# Patient Record
Sex: Female | Born: 1950
Health system: Southern US, Community
[De-identification: ages and names within clinical notes are randomized; demographics above are authoritative.]

## PROBLEM LIST (undated history)

## (undated) DIAGNOSIS — R0600 Dyspnea, unspecified: Secondary | ICD-10-CM

## (undated) DIAGNOSIS — J449 Chronic obstructive pulmonary disease, unspecified: Secondary | ICD-10-CM

## (undated) DIAGNOSIS — N189 Chronic kidney disease, unspecified: Secondary | ICD-10-CM

## (undated) DIAGNOSIS — I10 Essential (primary) hypertension: Secondary | ICD-10-CM

## (undated) DIAGNOSIS — IMO0002 Reserved for concepts with insufficient information to code with codable children: Secondary | ICD-10-CM

## (undated) DIAGNOSIS — M1990 Unspecified osteoarthritis, unspecified site: Secondary | ICD-10-CM

## (undated) HISTORY — PX: SHOULDER SURGERY: SHX246

## (undated) HISTORY — PX: TONSILLECTOMY: SUR1361

## (undated) HISTORY — PX: ABDOMINAL HYSTERECTOMY: SHX81

## (undated) HISTORY — PX: TUBAL LIGATION: SHX77

---

## 1999-02-12 ENCOUNTER — Encounter: Payer: Self-pay | Admitting: Emergency Medicine

## 1999-02-12 ENCOUNTER — Emergency Department (HOSPITAL_COMMUNITY): Admission: EM | Admit: 1999-02-12 | Discharge: 1999-02-12 | Payer: Self-pay | Admitting: Emergency Medicine

## 2004-12-31 ENCOUNTER — Emergency Department (HOSPITAL_COMMUNITY): Admission: EM | Admit: 2004-12-31 | Discharge: 2004-12-31 | Payer: Self-pay | Admitting: Emergency Medicine

## 2010-12-24 ENCOUNTER — Inpatient Hospital Stay (HOSPITAL_COMMUNITY)
Admission: EM | Admit: 2010-12-24 | Discharge: 2010-12-28 | Payer: Self-pay | Source: Home / Self Care | Attending: Internal Medicine | Admitting: Internal Medicine

## 2010-12-25 LAB — DIFFERENTIAL
Basophils Absolute: 0 10*3/uL (ref 0.0–0.1)
Basophils Relative: 0 % (ref 0–1)
Eosinophils Absolute: 0.3 10*3/uL (ref 0.0–0.7)
Eosinophils Relative: 4 % (ref 0–5)
Lymphocytes Relative: 25 % (ref 12–46)
Lymphs Abs: 2 10*3/uL (ref 0.7–4.0)
Monocytes Absolute: 0.4 10*3/uL (ref 0.1–1.0)
Monocytes Relative: 4 % (ref 3–12)
Neutro Abs: 5.5 10*3/uL (ref 1.7–7.7)
Neutrophils Relative %: 67 % (ref 43–77)

## 2010-12-25 LAB — BASIC METABOLIC PANEL
BUN: 11 mg/dL (ref 6–23)
CO2: 25 mEq/L (ref 19–32)
Calcium: 9.3 mg/dL (ref 8.4–10.5)
Chloride: 108 mEq/L (ref 96–112)
Creatinine, Ser: 1.49 mg/dL — ABNORMAL HIGH (ref 0.4–1.2)
GFR calc Af Amer: 43 mL/min — ABNORMAL LOW (ref >60)
GFR calc non Af Amer: 36 mL/min — ABNORMAL LOW (ref >60)
Glucose, Bld: 114 mg/dL — ABNORMAL HIGH (ref 70–99)
Potassium: 3.7 mEq/L (ref 3.5–5.1)
Sodium: 143 mEq/L (ref 135–145)

## 2010-12-25 LAB — CBC
HCT: 43.9 % (ref 36.0–46.0)
Hemoglobin: 14 g/dL (ref 12.0–15.0)
MCH: 30.4 pg (ref 26.0–34.0)
MCHC: 31.9 g/dL (ref 30.0–36.0)
MCV: 95.2 fL (ref 78.0–100.0)
Platelets: 128 10*3/uL — ABNORMAL LOW (ref 150–400)
RBC: 4.61 MIL/uL (ref 3.87–5.11)
RDW: 14.3 % (ref 11.5–15.5)
WBC: 8.2 10*3/uL (ref 4.0–10.5)

## 2010-12-29 NOTE — Discharge Summary (Addendum)
[name], Barbara [name]               ACCOUNT NO.:  [id number]  MEDICAL RECORD NO.:  [id number]          PATIENT TYPE:  INP  LOCATION:  1334                         FACILITY:  [facility]  PHYSICIAN:  Barbara Barbara [name], M.D.   DATE OF BIRTH:  1951/08/02  DATE OF ADMISSION:  12/24/2010 DATE OF DISCHARGE:  12/28/2010                              DISCHARGE SUMMARY   PRIMARY CARE PHYSICIAN:  None.  The patient states she will follow up with Dr. Clyda Barbara [name].  DISCHARGE DIAGNOSES: 1. Chronic obstructive pulmonary disease with acute exacerbation. 2. Ongoing tobacco abuse. 3. Acute renal failure in the setting of probable underlying stage 3     chronic kidney disease. 4. Thrombocytopenia. 5. Hypertension. 6. Hypokalemia. 7. Bronchitis. 8. Steroid-induced hyperglycemia.  DISCHARGE MEDICATIONS: 1. Advair 250/50 one puff inhaled b.i.d. once prednisone taper is     completed. 2. Albuterol 2.5 mg via nebulizer q.6 h p.r.n. dyspnea. 3. Clonidine 0.1 mg p.o. b.i.d. 4. Atrovent 0.02% inhaled q.6 h p.r.n. dyspnea. 5. Nicotine patch 21 mg transdermally daily x2 weeks, then taper to 14     mg transdermally daily x2 weeks, then taper to 7 mg transdermally     daily x2 weeks, then discontinue. 6. Prednisone Dosepak 60 mg p.o. on December 29, 2010, then decrease by     10 mg daily until off. 7. BC powders 1 packet p.o. q.6 h p.r.n. pain.  CONSULTATIONS:  None.  BRIEF ADMISSION HISTORY OF PRESENT ILLNESS:  The patient is a 60 year old female with a past medical history of COPD who presented to the hospital with a chief complaint of dyspnea unresponsive to use of Ventolin via inhaler.  Upon initial evaluation in the emergency department, the patient was noted to be acutely dyspneic with diffuse bronchospasm and subsequently was referred to the hospitalist service for further evaluation and treatment.  For the full details, please see the dictated report done by Dr. Selena [name].  PROCEDURES AND DIAGNOSTIC  STUDIES:  Chest x-ray on December 24, 2010, showed bronchitis and cardiomegaly.  DISCHARGE LABORATORY VALUES:  Sodium was 142, potassium 3.4 (repleted with 40 mEq of KCl prior to discharge), chloride 107, bicarb 27, BUN 15, creatinine 1.13, glucose 158, calcium 9.0.  White blood cell count was 12.2, hemoglobin 12.6, hematocrit 39.4, platelets 113.  A fractional excretion of sodium was calculated on December 26, 2010 and found to be 0.56%.  Urinalysis was negative for glucose, ketones, blood, protein, and nitrites.  Liver function studies were within normal limits with the exception of slightly low albumin at 3.4.  HOSPITAL COURSE BY PROBLEM: 1. Chronic obstructive pulmonary disease with acute exacerbation and     underlying bronchitis:  The patient was admitted and put on high-     dose IV Solu-Medrol.  She was empirically treated with IV Avelox.     She was provided with nebulized bronchodilator therapy and over the     course of her hospital stay had complete resolution of the     bronchospasm.  Her steroids have been on taper which she has been     tolerating well.  She has completed 5 days of  therapy with Avelox     and will not be discharged on any further antibiotic therapy as she     has been completely afebrile with no evidence of ongoing infection.     The patient was instructed to follow up with Dr. Bruna [name].  Because     she has cost constraints with regard to being able to afford her     medications, as many medications could be prescribed based on 4-     dollar [employer] list has been offered, however, Advair is not     available in generic and there are no generic alternatives.  The     patient was encouraged to make an appointment to see Dr. Bruna [name] to     see if he could get her into a prescription program or provide her     with samples. 2. Tobacco abuse:  The patient was counseled and is motivated to     discontinue tobacco use.  She was put on a nicotine patch and has      been completely educated regarding how to taper the patch.  The     patient has also been educated that if she should resolve resort to     smoking behavior, she should discontinue use of the patch. 3. Acute renal failure in the setting of probable underlying stage 3     chronic kidney disease:  The patient's baseline creatinine is not     known.  Her admission creatinine was 1.49.  A fractional excretion     of sodium was calculated and consistent with prerenal causes.  She     was hydrated and her discharge creatinine has improved to 1.13.     This corresponds to an estimated GFR of 59 mL per minute.  Since     she is currently fully hydrated, I suspect she does have underlying     stage 2 to 3 chronic kidney disease and will need close followup     and control of her blood pressure to prevent further deterioration     of her renal function. 4. Thrombocytopenia:  The patient was consistently thrombocytopenic     throughout her hospital stay with her platelet count in the 110s to     120s.  She had no signs of bleeding.  No further diagnostic     evaluation was done while in the hospital and she can follow up     with her primary care physician as an outpatient for this. 5. Hypertension:  The patient's blood pressure was elevated throughout     her hospital stay.  She was initially put on Norvasc which was     titrated up to achieve blood pressure control.  Given her cost     constraints, she will be discharged on clonidine and has been     encouraged to follow up with her primary care physician early next     week for a blood pressure check and adjustment of her medications     if indicated. 6. Hypokalemia:  The patient was repleted with 40 mEq of oral     replacement therapy prior to discharge. 7. Steroid-induced hyperglycemia:  The patient's hyperglycemia should     improve as the steroids are tapered.  She did not require treatment     and her glucose elevation was  mild.  DISPOSITION:  The patient is medically stable and will be discharged home.  We will attempt to obtain a nebulizer machine  for the patient prior to discharge.  CONDITION ON DISCHARGE:  Improved.  Time spent coordinating care for discharge and discharge instructions including face-to-face time equals 45 minutes.    Barbara Barbara [name], M.D.    CR/MEDQ  D:  12/28/2010  T:  12/28/2010  Job:  [id number]  cc:   Dr. Clyda Barbara [name]  Electronically Signed by Barbara Barbara [name] M.D. on 12/29/2010 06:57:47 AM

## 2010-12-30 LAB — DIFFERENTIAL
Basophils Absolute: 0 10*3/uL (ref 0.0–0.1)
Basophils Absolute: 0 10*3/uL (ref 0.0–0.1)
Basophils Relative: 0 % (ref 0–1)
Basophils Relative: 0 % (ref 0–1)
Eosinophils Absolute: 0 10*3/uL (ref 0.0–0.7)
Eosinophils Absolute: 0 10*3/uL (ref 0.0–0.7)
Eosinophils Relative: 0 % (ref 0–5)
Eosinophils Relative: 0 % (ref 0–5)
Lymphocytes Relative: 6 % — ABNORMAL LOW (ref 12–46)
Lymphocytes Relative: 3 % — ABNORMAL LOW (ref 12–46)
Lymphs Abs: 0.6 10*3/uL — ABNORMAL LOW (ref 0.7–4.0)
Lymphs Abs: 0.6 10*3/uL — ABNORMAL LOW (ref 0.7–4.0)
Monocytes Absolute: 0.1 10*3/uL (ref 0.1–1.0)
Monocytes Absolute: 0.4 10*3/uL (ref 0.1–1.0)
Monocytes Relative: 2 % — ABNORMAL LOW (ref 3–12)
Monocytes Relative: 2 % — ABNORMAL LOW (ref 3–12)
Neutro Abs: 8.7 10*3/uL — ABNORMAL HIGH (ref 1.7–7.7)
Neutro Abs: 15.5 10*3/uL — ABNORMAL HIGH (ref 1.7–7.7)
Neutrophils Relative %: 92 % — ABNORMAL HIGH (ref 43–77)
Neutrophils Relative %: 94 % — ABNORMAL HIGH (ref 43–77)

## 2010-12-30 LAB — COMPREHENSIVE METABOLIC PANEL
ALT: 19 U/L (ref 0–35)
ALT: 17 U/L (ref 0–35)
AST: 28 U/L (ref 0–37)
AST: 24 U/L (ref 0–37)
Albumin: 3.4 g/dL — ABNORMAL LOW (ref 3.5–5.2)
Albumin: 3.2 g/dL — ABNORMAL LOW (ref 3.5–5.2)
Alkaline Phosphatase: 70 U/L (ref 39–117)
Alkaline Phosphatase: 59 U/L (ref 39–117)
BUN: 17 mg/dL (ref 6–23)
BUN: 20 mg/dL (ref 6–23)
CO2: 24 mEq/L (ref 19–32)
CO2: 25 mEq/L (ref 19–32)
Calcium: 9.2 mg/dL (ref 8.4–10.5)
Calcium: 9 mg/dL (ref 8.4–10.5)
Chloride: 111 mEq/L (ref 96–112)
Chloride: 108 mEq/L (ref 96–112)
Creatinine, Ser: 1.41 mg/dL — ABNORMAL HIGH (ref 0.4–1.2)
Creatinine, Ser: 1.27 mg/dL — ABNORMAL HIGH (ref 0.4–1.2)
GFR calc Af Amer: 46 mL/min — ABNORMAL LOW (ref >60)
GFR calc Af Amer: 52 mL/min — ABNORMAL LOW (ref >60)
GFR calc non Af Amer: 38 mL/min — ABNORMAL LOW (ref >60)
GFR calc non Af Amer: 43 mL/min — ABNORMAL LOW (ref >60)
Glucose, Bld: 127 mg/dL — ABNORMAL HIGH (ref 70–99)
Glucose, Bld: 174 mg/dL — ABNORMAL HIGH (ref 70–99)
Potassium: 4 mEq/L (ref 3.5–5.1)
Potassium: 4.1 mEq/L (ref 3.5–5.1)
Sodium: 141 mEq/L (ref 135–145)
Sodium: 139 mEq/L (ref 135–145)
Total Bilirubin: 0.4 mg/dL (ref 0.3–1.2)
Total Bilirubin: 0.5 mg/dL (ref 0.3–1.2)
Total Protein: 6.8 g/dL (ref 6.0–8.3)
Total Protein: 6.9 g/dL (ref 6.0–8.3)

## 2010-12-30 LAB — CBC
HCT: 40.5 % (ref 36.0–46.0)
HCT: 39.1 % (ref 36.0–46.0)
HCT: 39.4 % (ref 36.0–46.0)
Hemoglobin: 12.8 g/dL (ref 12.0–15.0)
Hemoglobin: 12.3 g/dL (ref 12.0–15.0)
Hemoglobin: 12.6 g/dL (ref 12.0–15.0)
MCH: 30 pg (ref 26.0–34.0)
MCH: 30.1 pg (ref 26.0–34.0)
MCH: 29.6 pg (ref 26.0–34.0)
MCHC: 31.6 g/dL (ref 30.0–36.0)
MCHC: 31.5 g/dL (ref 30.0–36.0)
MCHC: 32 g/dL (ref 30.0–36.0)
MCV: 95.1 fL (ref 78.0–100.0)
MCV: 95.8 fL (ref 78.0–100.0)
MCV: 92.7 fL (ref 78.0–100.0)
Platelets: 114 10*3/uL — ABNORMAL LOW (ref 150–400)
Platelets: 117 10*3/uL — ABNORMAL LOW (ref 150–400)
Platelets: 113 10*3/uL — ABNORMAL LOW (ref 150–400)
RBC: 4.26 MIL/uL (ref 3.87–5.11)
RBC: 4.08 MIL/uL (ref 3.87–5.11)
RBC: 4.25 MIL/uL (ref 3.87–5.11)
RDW: 14.3 % (ref 11.5–15.5)
RDW: 14.6 % (ref 11.5–15.5)
RDW: 14.4 % (ref 11.5–15.5)
WBC: 9.4 10*3/uL (ref 4.0–10.5)
WBC: 16.4 10*3/uL — ABNORMAL HIGH (ref 4.0–10.5)
WBC: 12.2 10*3/uL — ABNORMAL HIGH (ref 4.0–10.5)

## 2010-12-30 LAB — BASIC METABOLIC PANEL
BUN: 20 mg/dL (ref 6–23)
CO2: 26 mEq/L (ref 19–32)
Calcium: 8.9 mg/dL (ref 8.4–10.5)
Chloride: 112 mEq/L (ref 96–112)
Creatinine, Ser: 1.19 mg/dL (ref 0.4–1.2)
GFR calc Af Amer: 56 mL/min — ABNORMAL LOW (ref >60)
GFR calc non Af Amer: 46 mL/min — ABNORMAL LOW (ref >60)
Glucose, Bld: 130 mg/dL — ABNORMAL HIGH (ref 70–99)
Potassium: 4.1 mEq/L (ref 3.5–5.1)
Sodium: 142 mEq/L (ref 135–145)

## 2010-12-30 LAB — URINALYSIS, ROUTINE W REFLEX MICROSCOPIC
Bilirubin Urine: NEGATIVE
Hgb urine dipstick: NEGATIVE
Ketones, ur: NEGATIVE mg/dL
Nitrite: NEGATIVE
Protein, ur: NEGATIVE mg/dL
Specific Gravity, Urine: 1.026 (ref 1.005–1.030)
Urine Glucose, Fasting: NEGATIVE mg/dL
Urobilinogen, UA: 0.2 mg/dL (ref 0.0–1.0)
pH: 5.5 (ref 5.0–8.0)

## 2010-12-30 LAB — URINE MICROSCOPIC-ADD ON

## 2010-12-30 LAB — CREATININE, URINE, RANDOM: Creatinine, Urine: 164.9 mg/dL

## 2010-12-30 LAB — SODIUM, URINE, RANDOM: Sodium, Ur: 93 mEq/L

## 2010-12-31 LAB — BASIC METABOLIC PANEL
BUN: 15 mg/dL (ref 6–23)
CO2: 27 mEq/L (ref 19–32)
Calcium: 9 mg/dL (ref 8.4–10.5)
Chloride: 107 mEq/L (ref 96–112)
Creatinine, Ser: 1.13 mg/dL (ref 0.4–1.2)
GFR calc Af Amer: 59 mL/min — ABNORMAL LOW (ref >60)
GFR calc non Af Amer: 49 mL/min — ABNORMAL LOW (ref >60)
Glucose, Bld: 158 mg/dL — ABNORMAL HIGH (ref 70–99)
Potassium: 3.4 mEq/L — ABNORMAL LOW (ref 3.5–5.1)
Sodium: 142 mEq/L (ref 135–145)

## 2011-01-18 NOTE — H&P (Signed)
Barbara [name], Barbara [name]               ACCOUNT NO.:  [id number]  MEDICAL RECORD NO.:  [id number]          PATIENT TYPE:  EMS  LOCATION:  ED                           FACILITY:  [facility]  PHYSICIAN:  Massie Maroon, MD        DATE OF BIRTH:  1951-04-09  DATE OF ADMISSION:  12/24/2010 DATE OF DISCHARGE:                             HISTORY & PHYSICAL   CHIEF COMPLAINT:  Shortness of breath.  HISTORY OF PRESENT ILLNESS:  60 year old female with a history of asthma, complains of shortness of breath for 3 days.  She has been tried to use a borrowed Ventolin inhaler.  She has had dry cough, runny nose, and denies any fever, chills, chest pain, palpitations, nausea, vomiting, diarrhea.  In spite of using Ventolin, her shortness of breath has been getting worse and so she presents to the ER today for evaluation.  Chest x-ray is negative for any acute process.  Patient will be admitted for possible asthma/COPD exacerbation.  PAST MEDICAL HISTORY: 1. Asthma. 2. Hypertension. 3. Arthritis of the right knee.  ALLERGIES:  No known drug allergies.  MEDICATIONS:  Ventolin HFA.  SOCIAL HISTORY:  The patient smokes one pack per day for 40 years.  She does not drink.  She works Psychologist, forensic.  FAMILY HISTORY:  Mother is alive at age 65 and has hypertension.  Father died in his 15s of brain cancer and had hypertension.  REVIEW OF SYSTEMS:  Negative for all 10 organ systems except for pertinent positives as stated above.  PHYSICAL EXAMINATION:  VITAL SIGNS:  Temperature 99.1, pulse 79, blood pressure 167/99, pulse ox is 97% on room air. HEENT: Anicteric. NECK:  No JVD, no bruit, no thyromegaly, no adenopathy. HEART:  Regular rate and rhythm.  S1 and S2.  No murmurs, gallops, or rubs. LUNGS:  Tight, positive bilateral expiratory wheezes.  No crackles. ABDOMEN:  Soft, nontender, nondistended.  Positive bowel sounds. EXTREMITIES:  No cyanosis, clubbing, or edema. SKIN:  No rashes. LYMPHATICS:  No  adenopathy. NEUROLOGIC:  Nonfocal.  Cranial nerves II through XII intact.  Reflexes are 2+, symmetric, diffuse with downgoing toes bilaterally, motor strength 5/5 in all 4 extremities, pinprick intact.  LABORATORY DATA:  Sodium 143, potassium 3.7, BUN 11, creatinine 1.49, calcium 9.3.  WBC 8.2, hemoglobin 14.0, platelet count 128.  Chest x-ray negative for any acute process other than bronchitis and cardiomegaly.  ASSESSMENT/PLAN: 1. Dyspnea likely secondary to asthma/chronic obstructive pulmonary     disease exacerbation.  The patient was treated with Solu-Medrol 80     mg IV q.8 along with Spiriva 1 puff daily along with Xopenex 1.25     mg 1 neb q.6 h. and q.6 h. p.r.n. and Avelox 400 mg IV daily.  The     patient is obviously counseled not to smoke. 2. Tobacco use:  The patient is counseled for 10 minutes on smoking     cessation.  Nicotine patch p.r.n. 3. Hypertension uncontrolled.  Clonidine 0.1 mg p.o. q.6 h. p.r.n. 4. Arthritis.  Tylenol. 5. Cardiomegaly.  Consider cardiac echo.     Massie Maroon, MD  JYK/MEDQ  D:  12/24/2010  T:  12/24/2010  Job:  [id number]  Electronically Signed by Pearson Grippe MD on 01/18/2011 10:58:24 PM

## 2011-10-11 ENCOUNTER — Emergency Department (HOSPITAL_COMMUNITY)
Admission: EM | Admit: 2011-10-11 | Discharge: 2011-10-11 | Disposition: A | Discharge status: Home / Self Care | Payer: Self-pay | Attending: Emergency Medicine | Admitting: Emergency Medicine

## 2011-10-11 ENCOUNTER — Emergency Department (HOSPITAL_COMMUNITY): Payer: Self-pay

## 2011-10-11 DIAGNOSIS — F17200 Nicotine dependence, unspecified, uncomplicated: Secondary | ICD-10-CM | POA: Insufficient documentation

## 2011-10-11 DIAGNOSIS — R0989 Other specified symptoms and signs involving the circulatory and respiratory systems: Secondary | ICD-10-CM | POA: Insufficient documentation

## 2011-10-11 DIAGNOSIS — J441 Chronic obstructive pulmonary disease with (acute) exacerbation: Secondary | ICD-10-CM | POA: Insufficient documentation

## 2011-10-11 DIAGNOSIS — R0602 Shortness of breath: Secondary | ICD-10-CM | POA: Insufficient documentation

## 2011-10-11 DIAGNOSIS — R0609 Other forms of dyspnea: Secondary | ICD-10-CM | POA: Insufficient documentation

## 2011-10-11 DIAGNOSIS — J45901 Unspecified asthma with (acute) exacerbation: Secondary | ICD-10-CM | POA: Insufficient documentation

## 2011-10-20 ENCOUNTER — Emergency Department (HOSPITAL_COMMUNITY): Payer: Self-pay

## 2011-10-20 ENCOUNTER — Inpatient Hospital Stay (HOSPITAL_COMMUNITY)
Admission: EM | Admit: 2011-10-20 | Discharge: 2011-10-23 | DRG: 192 | Disposition: A | Discharge status: Home / Self Care | Payer: Self-pay | Attending: Internal Medicine | Admitting: Internal Medicine

## 2011-10-20 ENCOUNTER — Encounter: Payer: Self-pay | Admitting: *Deleted

## 2011-10-20 DIAGNOSIS — R0602 Shortness of breath: Secondary | ICD-10-CM | POA: Diagnosis present

## 2011-10-20 DIAGNOSIS — I10 Essential (primary) hypertension: Secondary | ICD-10-CM

## 2011-10-20 DIAGNOSIS — J449 Chronic obstructive pulmonary disease, unspecified: Secondary | ICD-10-CM

## 2011-10-20 DIAGNOSIS — J441 Chronic obstructive pulmonary disease with (acute) exacerbation: Principal | ICD-10-CM

## 2011-10-20 DIAGNOSIS — F17200 Nicotine dependence, unspecified, uncomplicated: Secondary | ICD-10-CM | POA: Diagnosis present

## 2011-10-20 DIAGNOSIS — Z720 Tobacco use: Secondary | ICD-10-CM | POA: Diagnosis present

## 2011-10-20 HISTORY — DX: Essential (primary) hypertension: I10

## 2011-10-20 HISTORY — DX: Chronic obstructive pulmonary disease, unspecified: J44.9

## 2011-10-20 NOTE — ED Notes (Signed)
Pt in c/o shortness of breath since Saturday night, states she has increased her use of breathing treatments with minimal relief, pt also with cough, recently seen for same, pt O2 level 97% on RA in triage

## 2011-10-21 ENCOUNTER — Encounter (HOSPITAL_COMMUNITY): Payer: Self-pay | Admitting: Internal Medicine

## 2011-10-21 DIAGNOSIS — J449 Chronic obstructive pulmonary disease, unspecified: Secondary | ICD-10-CM | POA: Insufficient documentation

## 2011-10-21 DIAGNOSIS — Z720 Tobacco use: Secondary | ICD-10-CM | POA: Diagnosis present

## 2011-10-21 DIAGNOSIS — I10 Essential (primary) hypertension: Secondary | ICD-10-CM | POA: Insufficient documentation

## 2011-10-21 DIAGNOSIS — J441 Chronic obstructive pulmonary disease with (acute) exacerbation: Secondary | ICD-10-CM | POA: Diagnosis present

## 2011-10-21 LAB — CBC
HCT: 42.7 % (ref 36.0–46.0)
Hemoglobin: 12.2 g/dL (ref 12.0–15.0)
MCV: 94.1 fL (ref 78.0–100.0)
MCV: 92.6 fL (ref 78.0–100.0)
Platelets: 135 10*3/uL — ABNORMAL LOW (ref 150–400)
Platelets: 123 10*3/uL — ABNORMAL LOW (ref 150–400)
RBC: 4.54 MIL/uL (ref 3.87–5.11)
RBC: 4.17 MIL/uL (ref 3.87–5.11)
WBC: 10 10*3/uL (ref 4.0–10.5)
WBC: 10.9 10*3/uL — ABNORMAL HIGH (ref 4.0–10.5)

## 2011-10-21 LAB — DIFFERENTIAL
Basophils Absolute: 0 10*3/uL (ref 0.0–0.1)
Eosinophils Relative: 1 % (ref 0–5)
Lymphocytes Relative: 18 % (ref 12–46)
Lymphs Abs: 1.8 10*3/uL (ref 0.7–4.0)
Neutro Abs: 7.7 10*3/uL (ref 1.7–7.7)

## 2011-10-21 LAB — CARDIAC PANEL(CRET KIN+CKTOT+MB+TROPI)
CK, MB: 6.5 ng/mL (ref 0.3–4.0)
Relative Index: 0.8 (ref 0.0–2.5)
Relative Index: 0.5 (ref 0.0–2.5)
Total CK: 844 U/L — ABNORMAL HIGH (ref 7–177)

## 2011-10-21 LAB — POCT I-STAT, CHEM 8
BUN: 17 mg/dL (ref 6–23)
Chloride: 107 mEq/L (ref 96–112)
Creatinine, Ser: 1.3 mg/dL — ABNORMAL HIGH (ref 0.50–1.10)
Sodium: 142 mEq/L (ref 135–145)
TCO2: 26 mmol/L (ref 0–100)

## 2011-10-21 LAB — CREATININE, SERUM
Creatinine, Ser: 0.96 mg/dL (ref 0.50–1.10)
GFR calc Af Amer: 72 mL/min — ABNORMAL LOW (ref >90)

## 2011-10-21 MED ORDER — IPRATROPIUM BROMIDE 0.02 % IN SOLN
0.5000 mg | Freq: Once | RESPIRATORY_TRACT | Status: AC
  Administered 2011-10-21: 0.5 mg via RESPIRATORY_TRACT
  Filled 2011-10-21: qty 2.5

## 2011-10-21 MED ORDER — METHYLPREDNISOLONE SODIUM SUCC 125 MG IJ SOLR
60.0000 mg | Freq: Four times a day (QID) | INTRAMUSCULAR | Status: DC
  Administered 2011-10-21 – 2011-10-22 (×4): 60 mg via INTRAVENOUS
  Filled 2011-10-21 (×9): qty 2

## 2011-10-21 MED ORDER — PNEUMOCOCCAL VAC POLYVALENT 25 MCG/0.5ML IJ INJ
0.5000 mL | INJECTION | INTRAMUSCULAR | Status: AC
  Administered 2011-10-22: 0.5 mL via INTRAMUSCULAR
  Filled 2011-10-21: qty 0.5

## 2011-10-21 MED ORDER — IPRATROPIUM BROMIDE 0.02 % IN SOLN
0.5000 mg | Freq: Four times a day (QID) | RESPIRATORY_TRACT | Status: DC
  Administered 2011-10-21 – 2011-10-23 (×8): 0.5 mg via RESPIRATORY_TRACT
  Filled 2011-10-21 (×8): qty 2.5

## 2011-10-21 MED ORDER — GUAIFENESIN-DM 100-10 MG/5ML PO SYRP
5.0000 mL | ORAL_SOLUTION | ORAL | Status: DC | PRN
  Administered 2011-10-21: 5 mL via ORAL
  Filled 2011-10-21: qty 5
  Filled 2011-10-21: qty 10

## 2011-10-21 MED ORDER — METHYLPREDNISOLONE SODIUM SUCC 125 MG IJ SOLR
125.0000 mg | Freq: Once | INTRAMUSCULAR | Status: AC
  Administered 2011-10-21: 125 mg via INTRAVENOUS
  Filled 2011-10-21: qty 2

## 2011-10-21 MED ORDER — ALBUTEROL SULFATE (5 MG/ML) 0.5% IN NEBU
5.0000 mg | INHALATION_SOLUTION | RESPIRATORY_TRACT | Status: DC
  Administered 2011-10-21: 5 mg via RESPIRATORY_TRACT
  Filled 2011-10-21: qty 1

## 2011-10-21 MED ORDER — ALBUTEROL (5 MG/ML) CONTINUOUS INHALATION SOLN
15.0000 mg | INHALATION_SOLUTION | RESPIRATORY_TRACT | Status: DC
  Filled 2011-10-21: qty 20

## 2011-10-21 MED ORDER — SODIUM CHLORIDE 0.9 % IV BOLUS (SEPSIS)
1000.0000 mL | Freq: Once | INTRAVENOUS | Status: AC
  Administered 2011-10-21: 1000 mL via INTRAVENOUS

## 2011-10-21 MED ORDER — ONDANSETRON HCL 4 MG PO TABS: 4.0000 mg | ORAL_TABLET | Freq: Four times a day (QID) | ORAL | Status: DC | PRN

## 2011-10-21 MED ORDER — HYDROCODONE-HOMATROPINE 5-1.5 MG/5ML PO SYRP
5.0000 mL | ORAL_SOLUTION | Freq: Four times a day (QID) | ORAL | Status: DC | PRN
  Administered 2011-10-22 (×2): 5 mL via ORAL
  Filled 2011-10-21: qty 5

## 2011-10-21 MED ORDER — NICOTINE 21 MG/24HR TD PT24
21.0000 mg | MEDICATED_PATCH | Freq: Every day | TRANSDERMAL | Status: DC
  Administered 2011-10-21 – 2011-10-23 (×3): 21 mg via TRANSDERMAL
  Filled 2011-10-21 (×3): qty 1

## 2011-10-21 MED ORDER — ACETAMINOPHEN 325 MG PO TABS
650.0000 mg | ORAL_TABLET | Freq: Four times a day (QID) | ORAL | Status: DC | PRN
  Administered 2011-10-22 – 2011-10-23 (×2): 650 mg via ORAL
  Filled 2011-10-21 (×2): qty 2

## 2011-10-21 MED ORDER — SODIUM CHLORIDE 0.9 % IV SOLN: INTRAVENOUS | Status: DC

## 2011-10-21 MED ORDER — LEVALBUTEROL HCL 0.63 MG/3ML IN NEBU
0.6300 mg | INHALATION_SOLUTION | Freq: Four times a day (QID) | RESPIRATORY_TRACT | Status: DC
  Administered 2011-10-21 – 2011-10-23 (×8): 0.63 mg via RESPIRATORY_TRACT
  Filled 2011-10-21 (×12): qty 3

## 2011-10-21 MED ORDER — ALBUTEROL SULFATE (5 MG/ML) 0.5% IN NEBU
5.0000 mg | INHALATION_SOLUTION | Freq: Once | RESPIRATORY_TRACT | Status: AC
  Administered 2011-10-21: 5 mg via RESPIRATORY_TRACT
  Filled 2011-10-21: qty 1

## 2011-10-21 MED ORDER — BUDESONIDE-FORMOTEROL FUMARATE 80-4.5 MCG/ACT IN AERO
2.0000 | INHALATION_SPRAY | Freq: Two times a day (BID) | RESPIRATORY_TRACT | Status: DC
  Administered 2011-10-22 – 2011-10-23 (×3): 2 via RESPIRATORY_TRACT
  Filled 2011-10-21 (×2): qty 6.9

## 2011-10-21 MED ORDER — ONDANSETRON HCL 4 MG/2ML IJ SOLN: 4.0000 mg | Freq: Four times a day (QID) | INTRAMUSCULAR | Status: DC | PRN

## 2011-10-21 MED ORDER — SENNOSIDES-DOCUSATE SODIUM 8.6-50 MG PO TABS
1.0000 | ORAL_TABLET | Freq: Every day | ORAL | Status: DC | PRN
  Filled 2011-10-21: qty 1

## 2011-10-21 MED ORDER — ACETAMINOPHEN 650 MG RE SUPP: 650.0000 mg | Freq: Four times a day (QID) | RECTAL | Status: DC | PRN

## 2011-10-21 MED ORDER — INFLUENZA VIRUS VACC SPLIT PF IM SUSP
0.5000 mL | INTRAMUSCULAR | Status: AC
  Administered 2011-10-22: 0.5 mL via INTRAMUSCULAR
  Filled 2011-10-21: qty 0.5

## 2011-10-21 MED ORDER — SODIUM CHLORIDE 0.9 % IV SOLN
INTRAVENOUS | Status: DC
  Administered 2011-10-21 (×2): via INTRAVENOUS

## 2011-10-21 MED ORDER — HYDROCHLOROTHIAZIDE 25 MG PO TABS
25.0000 mg | ORAL_TABLET | Freq: Every day | ORAL | Status: DC
  Administered 2011-10-21 – 2011-10-23 (×3): 25 mg via ORAL
  Filled 2011-10-21 (×3): qty 1

## 2011-10-21 MED ORDER — FLUTICASONE-SALMETEROL 250-50 MCG/DOSE IN AEPB
1.0000 | INHALATION_SPRAY | Freq: Two times a day (BID) | RESPIRATORY_TRACT | Status: DC
  Administered 2011-10-21 – 2011-10-23 (×4): 1 via RESPIRATORY_TRACT
  Filled 2011-10-21: qty 14

## 2011-10-21 MED ORDER — HYDROMORPHONE HCL PF 1 MG/ML IJ SOLN: 1.0000 mg | INTRAMUSCULAR | Status: DC | PRN

## 2011-10-21 MED ORDER — MOXIFLOXACIN HCL IN NACL 400 MG/250ML IV SOLN
400.0000 mg | INTRAVENOUS | Status: DC
  Administered 2011-10-21 – 2011-10-22 (×2): 400 mg via INTRAVENOUS
  Filled 2011-10-21 (×3): qty 250

## 2011-10-21 MED ORDER — ENOXAPARIN SODIUM 40 MG/0.4ML ~~LOC~~ SOLN
40.0000 mg | SUBCUTANEOUS | Status: DC
  Administered 2011-10-21 – 2011-10-22 (×2): 40 mg via SUBCUTANEOUS
  Filled 2011-10-21 (×3): qty 0.4

## 2011-10-21 MED ORDER — ACETAMINOPHEN 325 MG PO TABS
650.0000 mg | ORAL_TABLET | Freq: Once | ORAL | Status: AC
  Administered 2011-10-21: 650 mg via ORAL
  Filled 2011-10-21: qty 2

## 2011-10-21 MED ORDER — AMLODIPINE BESYLATE 10 MG PO TABS
10.0000 mg | ORAL_TABLET | Freq: Every day | ORAL | Status: DC
  Administered 2011-10-21 – 2011-10-23 (×3): 10 mg via ORAL
  Filled 2011-10-21 (×3): qty 1

## 2011-10-21 NOTE — ED Notes (Signed)
RT called for neb tx.

## 2011-10-21 NOTE — ED Provider Notes (Cosign Needed)
History     CSN: [id number] Arrival date & time: 10/20/2011  8:24 PM   First MD Initiated Contact with Patient 10/20/11 2340      Chief Complaint  Patient presents with  . Shortness of Breath    (Consider location/radiation/quality/duration/timing/severity/associated sxs/prior treatment) HPI  She relates she has a history of COPD. She states 2 days ago she started getting dyspnea on exertion and on talking. She states she has a dry cough and is unaware of having fever. She states she has a pressure and tightness in her chest. She denies sore throat rhinorrhea vomiting or diarrhea. She relates she was here week ago and just finished prednisone a couple days ago. She states she was last admitted in January for a flareup of her COPD. She relates  her breathing has been so bad she is using her inhaler every 1-1/2 hours.  [facility]   Past Medical History  Diagnosis Date  . Hypertension   . COPD (chronic obstructive pulmonary disease)   . Asthma     History reviewed. No pertinent past surgical history.  History reviewed. No pertinent family history.  History  Substance Use Topics  . Smoking status: Current Everyday Smoker  . Smokeless tobacco: Not on file  . Alcohol Use: No   patient employed  OB History    Grav Para Term Preterm Abortions TAB SAB Ect Mult Living                  Review of Systems  All other systems reviewed and are negative.    Allergies  Clonidine derivatives; Codeine; Lisinopril; Penicillins; and Procaine  Home Medications   Current Outpatient Rx  Name Route Sig Dispense Refill  . ALBUTEROL SULFATE HFA 108 (90 BASE) MCG/ACT IN AERS Inhalation Inhale 2 puffs into the lungs every 6 (six) hours as needed. For shortness of breath      . ALBUTEROL SULFATE (2.5 MG/3ML) 0.083% IN NEBU Nebulization Take 2.5 mg by nebulization every 6 (six) hours as needed. For shortness of breath     . AMLODIPINE BESYLATE 10 MG PO TABS Oral  Take 10 mg by mouth daily.      Marland Kitchen FLUTICASONE-SALMETEROL 250-50 MCG/DOSE IN AEPB Inhalation Inhale 1 puff into the lungs every 12 (twelve) hours.      Marland Kitchen HYDROCHLOROTHIAZIDE 25 MG PO TABS Oral Take 25 mg by mouth daily.      Marland Kitchen TIOTROPIUM BROMIDE MONOHYDRATE 18 MCG IN CAPS Inhalation Place 18 mcg into inhaler and inhale daily. For shortness of breath      patient denies taking Advair and states she's only taking the Spiriva and albuterol inhalers  BP 134/76  Pulse 70  Temp(Src) 98.6 F (37 C) (Oral)  Resp 21  SpO2 100%  Vital signs normal  Physical Exam  Vitals reviewed. Constitutional: She appears well-developed and well-nourished.  HENT:  Head: Normocephalic and atraumatic.  Mouth/Throat: Uvula is midline, oropharynx is clear and moist and mucous membranes are normal.  Eyes: Conjunctivae and EOM are normal. Pupils are equal, round, and reactive to light.  Cardiovascular: Normal rate, regular rhythm, normal heart sounds and normal pulses.   Pulmonary/Chest:       Patient is to be lying on her side. She appears to be cachectic neck. She has some diffuse scattered wheezing with mild retractions.  Musculoskeletal:       Pt has no acute abnormality.    Neurological: She is alert. She has normal strength. No cranial nerve  deficit or sensory deficit.  Skin: Skin is warm, dry and intact. No rash noted.  Psychiatric: She has a normal mood and affect. Her speech is normal and behavior is normal.    ED Course  Procedures (including critical care time)  0300 recheck after continuous nebulizer albuterol 15 mg plus Atrovent 0.5 mg. Patient still appears tachypneic however her lungs do not have any wheezing or rhonchi. We will check her pulse ox when she ambulates. Her pulse ox currently is 92% on room air.  05:45 patient ambulated and her pulse ox was 93% but she had coughing fits and got more tachypneic. Requesting meds for headache and cough.   07:15 Dr Isidoro Donning accepts for admission to [facility],  Team 6   Results for orders placed during the hospital encounter of 10/20/11  CBC      Component Value Range   WBC 10.0  4.0 - 10.5 (K/uL)   RBC 4.54  3.87 - 5.11 (MIL/uL)   Hemoglobin 13.3  12.0 - 15.0 (g/dL)   HCT 16.1  09.6 - 04.5 (%)   MCV 94.1  78.0 - 100.0 (fL)   MCH 29.3  26.0 - 34.0 (pg)   MCHC 31.1  30.0 - 36.0 (g/dL)   RDW 40.9  81.1 - 91.4 (%)   Platelets 135 (*) 150 - 400 (K/uL)  DIFFERENTIAL      Component Value Range   Neutrophils Relative 77  43 - 77 (%)   Neutro Abs 7.7  1.7 - 7.7 (K/uL)   Lymphocytes Relative 18  12 - 46 (%)   Lymphs Abs 1.8  0.7 - 4.0 (K/uL)   Monocytes Relative 4  3 - 12 (%)   Monocytes Absolute 0.4  0.1 - 1.0 (K/uL)   Eosinophils Relative 1  0 - 5 (%)   Eosinophils Absolute 0.1  0.0 - 0.7 (K/uL)   Basophils Relative 0  0 - 1 (%)   Basophils Absolute 0.0  0.0 - 0.1 (K/uL)  POCT I-STAT, CHEM 8      Component Value Range   Sodium 142  135 - 145 (mEq/L)   Potassium 3.9  3.5 - 5.1 (mEq/L)   Chloride 107  96 - 112 (mEq/L)   BUN 17  6 - 23 (mg/dL)   Creatinine, Ser 7.82 (*) 0.50 - 1.10 (mg/dL)   Glucose, Bld 956 (*) 70 - 99 (mg/dL)   Calcium, Ion 2.13 (*) 1.12 - 1.32 (mmol/L)   TCO2 26  0 - 100 (mmol/L)   Hemoglobin 15.0  12.0 - 15.0 (g/dL)   HCT 08.6  57.8 - 46.9 (%)   Laboratory interpretation renal insufficiency otherwise normal   Dg Chest 2 View  10/20/2011  *RADIOLOGY REPORT*  Clinical Data: Cough and short of breath  CHEST - 2 VIEW  Comparison: 10/11/2011  Findings: Heart size upper normal.  Negative for heart failure. Negative for pneumonia or effusion.  Lungs are clear.  IMPRESSION: No active cardiopulmonary disease.  Original Report Authenticated By: Camelia Phenes, M.D.     Diagnoses that have been ruled out:  Diagnoses that are still under consideration:  Final diagnoses:  COPD exacerbation  Hypertension  COPD (chronic obstructive pulmonary disease)   Admission  CRITICAL CARE Performed by: Devoria Albe L   Total  critical care time: 32 minutes  Critical care time was exclusive of separately billable procedures and treating other patients.  Critical care was necessary to treat or prevent imminent or life-threatening deterioration.  Critical care was time spent personally by  me on the following activities: development of treatment plan with patient and/or surrogate as well as nursing, discussions with consultants, evaluation of patient's response to treatment, examination of patient, obtaining history from patient or surrogate, ordering and performing treatments and interventions, ordering and review of laboratory studies, ordering and review of radiographic studies, pulse oximetry and re-evaluation of patient's condition.  Devoria Albe, MD, FACEP   MDM          Ward Givens, MD 10/21/11 [phone]

## 2011-10-21 NOTE — ED Notes (Addendum)
B/P 130/59  HR 96  R 22  Pulse Ox 94%--Placed on 02 at 2 liters via nasal cannula

## 2011-10-21 NOTE — ED Notes (Signed)
Called RT for breathing treatment

## 2011-10-21 NOTE — H&P (Signed)
PCP:  [facility]  Chief Complaint:  Shortness of breath with wheezing and COPD flare for last 3-4 days  HPI: Ms. Cobbs is a 60 year old African American female with known history of COPD with asthma, nicotine abuse, hypertension presented to the [facility] emergency room with shortness of breath. History was provided by the patient herself. Patient states that she was in the emergency room 2 weeks ago with a COPD flare. She was placed on her nebulizers albuterol inhaler and prednisone. Patient finished her prednisone taper on Thursday 4 days ago. Patient states that she only got her nebulizers on Thursday that was 4 days ago due to insurance issues and she was only using her rescue inhaler all the time before that. Patient states that for last 3 days, her shortness of breath has been worsening and she has been using her nebulizers every 3 hours and rescue inhaler almost every hour. Patient is not on any home oxygen. She denied any fever or chills. She does have cough but denies any productive phlegm states that "she can't bring it up". She admits to wheezing but no chest pain, orthopnea, PND. She still smokes about 2 packs per week.   Review of Systems:  Constitutional: Denies fever, chills, diaphoresis, appetite change and fatigue.  HEENT: Denies photophobia, eye pain, redness, hearing loss, ear pain, congestion, sore throat, rhinorrhea, sneezing, mouth sores, trouble swallowing, neck pain, neck stiffness and tinnitus.   Respiratory: See history of present illness Cardiovascular: Denies chest pain, palpitations and leg swelling.  Gastrointestinal: Denies nausea, vomiting, abdominal pain, diarrhea, constipation, blood in stool and abdominal distention.  Genitourinary: Denies dysuria, urgency, frequency, hematuria, flank pain and difficulty urinating.  Musculoskeletal: Denies myalgias, back pain, joint swelling, arthralgias and gait problem.  Skin: Denies pallor, rash and wound.    Neurological: Denies dizziness, seizures, syncope, weakness, light-headedness, numbness and headaches.  Hematological: Denies adenopathy. Easy bruising, personal or family bleeding history  Psychiatric/Behavioral: Denies suicidal ideation, mood changes, confusion, nervousness, sleep disturbance and agitation  Past Medical History: Past Medical History  Diagnosis Date  . Hypertension   . COPD (chronic obstructive pulmonary disease)   . Asthma    History reviewed. No pertinent past surgical history.  Medications: Prior to Admission medications   Medication Sig Start Date End Date Taking? Authorizing Provider  albuterol (PROVENTIL HFA;VENTOLIN HFA) 108 (90 BASE) MCG/ACT inhaler Inhale 2 puffs into the lungs every 6 (six) hours as needed. For shortness of breath     Yes Historical Provider, MD  albuterol (PROVENTIL) (2.5 MG/3ML) 0.083% nebulizer solution Take 2.5 mg by nebulization every 6 (six) hours as needed. For shortness of breath    Yes Historical Provider, MD  amLODipine (NORVASC) 10 MG tablet Take 10 mg by mouth daily.     Yes Historical Provider, MD  Fluticasone-Salmeterol (ADVAIR) 250-50 MCG/DOSE AEPB Inhale 1 puff into the lungs every 12 (twelve) hours.     Yes Historical Provider, MD  hydrochlorothiazide (HYDRODIURIL) 25 MG tablet Take 25 mg by mouth daily.     Yes Historical Provider, MD  tiotropium (SPIRIVA) 18 MCG inhalation capsule Place 18 mcg into inhaler and inhale daily. For shortness of breath    Yes Historical Provider, MD    Allergies:   Allergies  Allergen Reactions  . Clonidine Derivatives     Coughing shortness of breath  . Codeine Other (See Comments)    Reaction: hallucination   . Lisinopril   . Penicillins Hives  . Procaine Rash    Social  History:  reports that she has been smoking.  She does not have any smokeless tobacco history on file. She reports that she does not drink alcohol or use illicit drugs.  Family History: History reviewed. No  pertinent family history.  Physical Exam: Blood pressure 134/76, pulse 118, temperature 98.6 F (37 C), temperature source Oral, resp. rate 22, SpO2 95.00%. General: Alert, awake, oriented x3, in no acute distress. HEENT: anicteric sclera, pink conjunctiva, pupils equal and reactive to light and accomodation Neck: supple, no masses or lymphadenopathy, no goiter, no bruits  Heart: Regular rate and rhythm, without murmurs, rubs or gallops. Lungs: Decreased breath sounds throughout, tightness Abdomen: Soft, nontender, nondistended, positive bowel sounds, no masses. Extremities: No clubbing, cyanosis or edema with positive pedal pulses. Neuro: Grossly intact, no focal neurological deficits, strength 5/5 upper and lower extremities bilaterally Psych: alert and oriented x 3, normal mood and affect Skin: no rashes or lesions, warm and dry   Labs on Admission:  Results for orders placed during the hospital encounter of 10/20/11 (from the past 24 hour(s))  CBC     Status: Abnormal   Collection Time   10/21/11 12:50 AM      Component Value Range   WBC 10.0  4.0 - 10.5 (K/uL)   RBC 4.54  3.87 - 5.11 (MIL/uL)   Hemoglobin 13.3  12.0 - 15.0 (g/dL)   HCT 16.1  09.6 - 04.5 (%)   MCV 94.1  78.0 - 100.0 (fL)   MCH 29.3  26.0 - 34.0 (pg)   MCHC 31.1  30.0 - 36.0 (g/dL)   RDW 40.9  81.1 - 91.4 (%)   Platelets 135 (*) 150 - 400 (K/uL)  DIFFERENTIAL     Status: Normal   Collection Time   10/21/11 12:50 AM      Component Value Range   Neutrophils Relative 77  43 - 77 (%)   Neutro Abs 7.7  1.7 - 7.7 (K/uL)   Lymphocytes Relative 18  12 - 46 (%)   Lymphs Abs 1.8  0.7 - 4.0 (K/uL)   Monocytes Relative 4  3 - 12 (%)   Monocytes Absolute 0.4  0.1 - 1.0 (K/uL)   Eosinophils Relative 1  0 - 5 (%)   Eosinophils Absolute 0.1  0.0 - 0.7 (K/uL)   Basophils Relative 0  0 - 1 (%)   Basophils Absolute 0.0  0.0 - 0.1 (K/uL)  POCT I-STAT, CHEM 8     Status: Abnormal   Collection Time   10/21/11  1:01 AM       Component Value Range   Sodium 142  135 - 145 (mEq/L)   Potassium 3.9  3.5 - 5.1 (mEq/L)   Chloride 107  96 - 112 (mEq/L)   BUN 17  6 - 23 (mg/dL)   Creatinine, Ser 7.82 (*) 0.50 - 1.10 (mg/dL)   Glucose, Bld 956 (*) 70 - 99 (mg/dL)   Calcium, Ion 2.13 (*) 1.12 - 1.32 (mmol/L)   TCO2 26  0 - 100 (mmol/L)   Hemoglobin 15.0  12.0 - 15.0 (g/dL)   HCT 08.6  57.8 - 46.9 (%)    Radiological Exams on Admission: Dg Chest 2 View  10/20/2011  *RADIOLOGY REPORT*  Clinical Data: Cough and short of breath  CHEST - 2 VIEW  Comparison: 10/11/2011  Findings: Heart size upper normal.  Negative for heart failure. Negative for pneumonia or effusion.  Lungs are clear.  IMPRESSION: No active cardiopulmonary disease.  Original Report Authenticated By:  Camelia Phenes, M.D.   Dg Chest 2 View  10/11/2011  *RADIOLOGY REPORT*  Clinical Data: Wheezing and shortness of breath.  History of COPD.  CHEST - 2 VIEW  Comparison: Chest radiographs 12/24/2010.  Findings: There is stable mild cardiomegaly.  The mediastinal contours are stable.  The lungs are clear.  There is no pleural effusion.  A moderate thoracolumbar scoliosis is noted.  Mid thoracic osteophyte on the lateral view appears unchanged.  IMPRESSION: Mild cardiomegaly.  No acute cardiopulmonary process.  Original Report Authenticated By: Gerrianne Scale, M.D.    Assessment/Plan Present on Admission:  .COPD with acute exacerbation - Admit to medicine service, placed on scheduled nebulizers with Xopenex and Atrovent, IV Solu-Medrol, IV Avelox and cough syrup, oxygen via nasal cannula, incentive spirometry. - Will also place patient on Symbicort  .Nicotine abuse - Patient was counseled strongly for smoking cessation, place on nicotine patch  Hypertension - Placed on Norvasc and hydrochlorothiazide per her home dose   DVT prophylaxis: Lovenox  CODE STATUS: I discussed in detail with the patient, she opted to be a limited code with no CPR or  intubation, but she is okay with antiarrhythmics vasopressors or BiPAP if needed.  @Time  Spent on Admission:  1 hour  Jovanie Verge 10/21/2011, 8:14 AM

## 2011-10-21 NOTE — ED Notes (Signed)
Report called to [facility], RN on 4E

## 2011-10-21 NOTE — ED Notes (Signed)
Pt. Remains alert and oriented.  Neb trt in progress with pt. Tolerating well.  Heart rate 98  Pulse ox 96%.

## 2011-10-22 LAB — CARDIAC PANEL(CRET KIN+CKTOT+MB+TROPI)
Relative Index: 0.5 (ref 0.0–2.5)
Total CK: 1480 U/L — ABNORMAL HIGH (ref 7–177)
Troponin I: <0.3 ng/mL (ref <0.30)

## 2011-10-22 LAB — URINE CULTURE

## 2011-10-22 LAB — CBC
MCHC: 30.8 g/dL (ref 30.0–36.0)
RDW: 14.9 % (ref 11.5–15.5)

## 2011-10-22 LAB — BASIC METABOLIC PANEL
BUN: 21 mg/dL (ref 6–23)
Creatinine, Ser: 1.05 mg/dL (ref 0.50–1.10)
GFR calc Af Amer: 65 mL/min — ABNORMAL LOW (ref >90)
GFR calc non Af Amer: 56 mL/min — ABNORMAL LOW (ref >90)
Potassium: 4 mEq/L (ref 3.5–5.1)

## 2011-10-22 MED ORDER — BENZONATATE 100 MG PO CAPS
100.0000 mg | ORAL_CAPSULE | Freq: Three times a day (TID) | ORAL | Status: DC
  Administered 2011-10-22 – 2011-10-23 (×3): 100 mg via ORAL
  Filled 2011-10-22 (×5): qty 1

## 2011-10-22 MED ORDER — VITAMINS A & D EX OINT
TOPICAL_OINTMENT | CUTANEOUS | Status: AC
  Filled 2011-10-22: qty 5

## 2011-10-22 MED ORDER — METHYLPREDNISOLONE SODIUM SUCC 40 MG IJ SOLR
40.0000 mg | Freq: Three times a day (TID) | INTRAMUSCULAR | Status: DC
  Administered 2011-10-22 – 2011-10-23 (×4): 40 mg via INTRAVENOUS
  Filled 2011-10-22 (×6): qty 1

## 2011-10-22 NOTE — Progress Notes (Signed)
Spoke with patient at bedside. States she feels like she is close to baseline, continues to feel slightly SOB but improved from admission. States she has issues with medications resolved and has what she needs currently. Confirmed that she has PCP thru [facility] [employer]. Lives at home with sister who is available if needed. Uses the bus for transportation. No d/c needs identified but will continue to follow.

## 2011-10-22 NOTE — Progress Notes (Signed)
Subjective: Feels a lot better today, still coughing, wheezing improved  Objective: Weight change:   Intake/Output Summary (Last 24 hours) at 10/22/11 1311 Last data filed at 10/22/11 0755  Gross per 24 hour  Intake   1680 ml  Output   1700 ml  Net    -20 ml   Blood pressure 140/75, pulse 80, temperature 98 F (36.7 C), temperature source Oral, resp. rate 20, height 5\' 5"  (1.651 m), weight 77.792 kg (171 lb 8 oz), SpO2 93.00%.  Physical Exam: General: Alert and awake, oriented x3, not in any acute distress. HEENT: anicteric sclera, pupils reactive to light and accommodation, EOMI CVS: S1-S2 clear, no murmur rubs or gallops Chest: Mild scattered wheezing on coughing bilaterally Abdomen: soft nontender, nondistended, normal bowel sounds, no organomegaly Extremities: no cyanosis, clubbing or edema noted bilaterally Neuro: Cranial nerves II-XII intact, no focal neurological deficits  Lab Results:  Basename 10/22/11 0005 10/21/11 1303  WBC 18.4* 10.9*  HGB 11.6* 12.2  HCT 37.7 38.6  PLT 120* 123*   BMET  Basename 10/22/11 0005 10/21/11 1303 10/21/11 0101  NA 138 -- 142  K 4.0 -- 3.9  CL 105 -- 107  CO2 25 -- --  GLUCOSE 161* -- 103*  BUN 21 -- 17  CREATININE 1.05 0.96 --  CALCIUM 9.4 -- --    Micro Results: No results found for this or any previous visit (from the past 240 hour(s)).  Studies/Results: Dg Chest 2 View  10/20/2011  *RADIOLOGY REPORT*  Clinical Data: Cough and short of breath  CHEST - 2 VIEW  Comparison: 10/11/2011  Findings: Heart size upper normal.  Negative for heart failure. Negative for pneumonia or effusion.  Lungs are clear.  IMPRESSION: No active cardiopulmonary disease.  Original Report Authenticated By: Camelia Phenes, M.D.   Dg Chest 2 View  10/11/2011  *RADIOLOGY REPORT*  Clinical Data: Wheezing and shortness of breath.  History of COPD.  CHEST - 2 VIEW  Comparison: Chest radiographs 12/24/2010.  Findings: There is stable mild cardiomegaly.   The mediastinal contours are stable.  The lungs are clear.  There is no pleural effusion.  A moderate thoracolumbar scoliosis is noted.  Mid thoracic osteophyte on the lateral view appears unchanged.  IMPRESSION: Mild cardiomegaly.  No acute cardiopulmonary process.  Original Report Authenticated By: Gerrianne Scale, M.D.    Medications: Scheduled Meds:   . amLODipine  10 mg Oral Daily  . benzonatate  100 mg Oral TID  . budesonide-formoterol  2 puff Inhalation BID  . enoxaparin  40 mg Subcutaneous Q24H  . Fluticasone-Salmeterol  1 puff Inhalation Q12H  . hydrochlorothiazide  25 mg Oral Daily  . influenza  inactive virus vaccine  0.5 mL Intramuscular Tomorrow-1000  . ipratropium  0.5 mg Nebulization Q6H  . levalbuterol  0.63 mg Nebulization Q6H  . methylPREDNISolone (SOLU-MEDROL) injection  40 mg Intravenous Q8H  . moxifloxacin  400 mg Intravenous Q24H  . nicotine  21 mg Transdermal Daily  . pneumococcal 23 valent vaccine  0.5 mL Intramuscular Tomorrow-1000  . vitamin A & D      . DISCONTD: sodium chloride   Intravenous STAT  . DISCONTD: albuterol  5 mg Nebulization Q4H  . DISCONTD: methylPREDNISolone (SOLU-MEDROL) injection  60 mg Intravenous Q6H   Continuous Infusions:   . sodium chloride 100 mL/hr at 10/22/11 0700  . DISCONTD: albuterol     PRN Meds:.acetaminophen, acetaminophen, guaiFENesin-dextromethorphan, HYDROcodone-homatropine, HYDROmorphone, ondansetron (ZOFRAN) IV, ondansetron, senna-docusate  Assessment/Plan: COPD with acute exacerbation  -  Continue current management with scheduled nebulizers with Xopenex and Atrovent - Taper IV Solu-Medrol, continue IV Avelox and cough syrup, oxygen via nasal cannula, incentive spirometry, continue Symbicort   .Nicotine abuse  - Patient was counseled strongly for smoking cessation, place on nicotine patch  - Smoking cessation consult  Hypertension  - On Norvasc and hydrochlorothiazide per her home dose   DVT prophylaxis:  Lovenox    disposition: Likely DC home in a.m. if stable    LOS: 2 days   RAI,RIPUDEEP 10/22/2011, 1:11 PM

## 2011-10-22 NOTE — Progress Notes (Signed)
Physical Therapy Note  Order received. Chart reviewed. Spoke with nursing and pt who both report pt has been mobilizing in room/hallway without assist or assistive device. No need for physical therapy services at this time. PT will sign off. Thanks.

## 2011-10-23 LAB — CBC
Platelets: 128 10*3/uL — ABNORMAL LOW (ref 150–400)
RBC: 4.2 MIL/uL (ref 3.87–5.11)
WBC: 18.7 10*3/uL — ABNORMAL HIGH (ref 4.0–10.5)

## 2011-10-23 MED ORDER — PREDNISONE (PAK) 10 MG PO TABS: 10.0000 mg | ORAL_TABLET | Freq: Every day | ORAL | Status: AC

## 2011-10-23 MED ORDER — HYDROCODONE-HOMATROPINE 5-1.5 MG/5ML PO SYRP: 5.0000 mL | ORAL_SOLUTION | Freq: Four times a day (QID) | ORAL | Status: AC | PRN

## 2011-10-23 MED ORDER — BUDESONIDE-FORMOTEROL FUMARATE 80-4.5 MCG/ACT IN AERO: 2.0000 | INHALATION_SPRAY | Freq: Two times a day (BID) | RESPIRATORY_TRACT | Status: DC

## 2011-10-23 MED ORDER — DOXYCYCLINE HYCLATE 100 MG PO TABS: 100.0000 mg | ORAL_TABLET | Freq: Two times a day (BID) | ORAL | Status: AC

## 2011-10-23 NOTE — Discharge Summary (Signed)
Physician Discharge Summary  Patient ID: Barbara [name] MRN: [id number] DOB/AGE: [birth year] 62 y.o.  Admit date: 10/20/2011 Discharge date: 10/23/2011  Primary Care Physician:  No primary provider on file.  Discharge Diagnoses:   Present on Admission:  .COPD with acute exacerbation .Nicotine abuse Hypertension   Consults:  None  Discharge Medications: Current Discharge Medication List    START taking these medications   Details  budesonide-formoterol (SYMBICORT) 80-4.5 MCG/ACT inhaler Inhale 2 puffs into the lungs 2 (two) times daily. Qty: 1 Inhaler, Refills: 3    doxycycline (VIBRA-TABS) 100 MG tablet Take 1 tablet (100 mg total) by mouth 2 (two) times daily. Qty: 20 tablet, Refills: 0    HYDROcodone-homatropine (HYCODAN) 5-1.5 MG/5ML syrup Take 5 mLs by mouth every 6 (six) hours as needed for cough. Qty: 120 mL, Refills: 0    predniSONE (STERAPRED UNI-PAK) 10 MG tablet Take 1 tablet (10 mg total) by mouth daily. Prednisone dosing: Take  Prednisone 40mg  (4 tabs) x 4days, then taper to 30mg  (3 tabs) x 3 days, then 20mg  (2 tabs) x 3days, then 10mg  (1 tab) x 3days, then OFF.  Dispense:  34 tabs, refills: None Qty: 34 tablet, Refills: 0      CONTINUE these medications which have NOT CHANGED   Details  albuterol (PROVENTIL HFA;VENTOLIN HFA) 108 (90 BASE) MCG/ACT inhaler Inhale 2 puffs into the lungs every 6 (six) hours as needed. For shortness of breath      albuterol (PROVENTIL) (2.5 MG/3ML) 0.083% nebulizer solution Take 2.5 mg by nebulization every 6 (six) hours as needed. For shortness of breath     amLODipine (NORVASC) 10 MG tablet Take 10 mg by mouth daily.      hydrochlorothiazide (HYDRODIURIL) 25 MG tablet Take 25 mg by mouth daily.      tiotropium (SPIRIVA) 18 MCG inhalation capsule Place 18 mcg into inhaler and inhale daily. For shortness of breath       STOP taking these medications     Fluticasone-Salmeterol (ADVAIR) 250-50 MCG/DOSE AEPB           Significant Diagnostic Studies:  Dg Chest 2 View  10/20/2011  *RADIOLOGY REPORT*  Clinical Data: Cough and short of breath  CHEST - 2 VIEW  Comparison: 10/11/2011  Findings: Heart size upper normal.  Negative for heart failure. Negative for pneumonia or effusion.  Lungs are clear.  IMPRESSION: No active cardiopulmonary disease.  Original Report Authenticated By: Camelia Phenes, M.D.    Brief H and P: For complete details please refer to admission H and P, but in brief, patient is 60 year old [place] American female with known history of COPD with asthma, nicotine abuse, hypertension presented to the [facility] emergency room with shortness of breath. History was provided by the patient herself. Patient states that she was in the emergency room 2 weeks ago with a COPD flare. She was placed on her nebulizers albuterol inhaler and prednisone. Patient finished her prednisone taper on Thursday 4 days ago. Patient states that she only got her nebulizers on Thursday that was 4 days ago due to insurance issues and she was only using her rescue inhaler all the time before that. Patient states that for last 3 days, her shortness of breath has been worsening and she has been using her nebulizers every 3 hours and rescue inhaler almost every hour. Patient is not on any home oxygen. She denied any fever or chills. She does have cough but denies any productive phlegm states that "she can't bring it up".  She admits to wheezing but no chest pain, orthopnea, PND. She still smokes about 2 packs per week.   Hospital Course:  COPD with acute exacerbation: Patient was admitted to the medicine service as this was a recurrent exacerbation. She was placed on scheduled nebulizers with Xopenex and Atrovent. She was placed on IV Solu-Medrol which was tapered. Patient was also placed on IV antibiotics and cough syrup, oxygen, incentive spirometry, Symbicort. Patient will continue prednisone with taper, albuterol nebs,  Symbicort after discharge. .Nicotine abuse  - Patient was counseled strongly for smoking cessation and was placed on nicotine patch  Hypertension remained stable patient was placed on Norvasc and hydrochlorothiazide for her home dose   Day of Discharge BP 153/83  Pulse 68  Temp(Src) 98.5 F (36.9 C) (Oral)  Resp 18  Ht 5\' 5"  (1.651 m)  Wt 77.021 kg (169 lb 12.8 oz)  BMI 28.26 kg/m2  SpO2 96%  Results for orders placed during the hospital encounter of 10/20/11 (from the past 24 hour(s))  CBC     Status: Abnormal   Collection Time   10/23/11  4:35 AM      Component Value Range   WBC 18.7 (*) 4.0 - 10.5 (K/uL)   RBC 4.20  3.87 - 5.11 (MIL/uL)   Hemoglobin 12.3  12.0 - 15.0 (g/dL)   HCT 16.1  09.6 - 04.5 (%)   MCV 92.6  78.0 - 100.0 (fL)   MCH 29.3  26.0 - 34.0 (pg)   MCHC 31.6  30.0 - 36.0 (g/dL)   RDW 40.9  81.1 - 91.4 (%)   Platelets 128 (*) 150 - 400 (K/uL)    Physical Exam: General: Alert and awake oriented x3 not in any acute distress. HEENT: anicteric sclera, pupils reactive to light and accommodation CVS: S1-S2 clear no murmur rubs or gallops Chest: clear to auscultation bilaterally, no wheezing rales or rhonchi Abdomen: soft nontender, nondistended, normal bowel sounds, no organomegaly Extremities: no cyanosis, clubbing or edema noted bilaterally Neuro: Cranial nerves II-XII intact, no focal neurological deficits  Disposition: Home Diet: heart healthy  Activity: as tolerated   Disposition and Follow-up: Discharge Orders    Future Orders Please Complete By Expires   Diet - low sodium heart healthy      Increase activity slowly         DISCHARGE FOLLOW-UP Follow-up Information    Follow up with [facility]. Make an appointment in 10 days. (make appointment for early next week if symptoms are worsening )          Time spent on Discharge: 45 minutes   Signed: RAI,RIPUDEEP 10/23/2011, 2:14 PM

## 2011-11-26 ENCOUNTER — Other Ambulatory Visit: Payer: Self-pay

## 2011-11-26 ENCOUNTER — Emergency Department (HOSPITAL_COMMUNITY): Payer: Self-pay

## 2011-11-26 ENCOUNTER — Encounter (HOSPITAL_COMMUNITY): Payer: Self-pay | Admitting: Emergency Medicine

## 2011-11-26 ENCOUNTER — Inpatient Hospital Stay (HOSPITAL_COMMUNITY)
Admission: EM | Admit: 2011-11-26 | Discharge: 2011-12-01 | DRG: 191 | Disposition: A | Discharge status: Home / Self Care | Payer: Self-pay | Attending: Internal Medicine | Admitting: Internal Medicine

## 2011-11-26 DIAGNOSIS — T380X5A Adverse effect of glucocorticoids and synthetic analogues, initial encounter: Secondary | ICD-10-CM | POA: Diagnosis not present

## 2011-11-26 DIAGNOSIS — J479 Bronchiectasis, uncomplicated: Secondary | ICD-10-CM | POA: Diagnosis present

## 2011-11-26 DIAGNOSIS — F17200 Nicotine dependence, unspecified, uncomplicated: Secondary | ICD-10-CM | POA: Diagnosis present

## 2011-11-26 DIAGNOSIS — J111 Influenza due to unidentified influenza virus with other respiratory manifestations: Secondary | ICD-10-CM | POA: Diagnosis present

## 2011-11-26 DIAGNOSIS — I10 Essential (primary) hypertension: Secondary | ICD-10-CM | POA: Diagnosis present

## 2011-11-26 DIAGNOSIS — E860 Dehydration: Secondary | ICD-10-CM | POA: Diagnosis present

## 2011-11-26 DIAGNOSIS — J441 Chronic obstructive pulmonary disease with (acute) exacerbation: Principal | ICD-10-CM | POA: Diagnosis present

## 2011-11-26 DIAGNOSIS — N179 Acute kidney failure, unspecified: Secondary | ICD-10-CM | POA: Diagnosis present

## 2011-11-26 DIAGNOSIS — R7309 Other abnormal glucose: Secondary | ICD-10-CM | POA: Diagnosis not present

## 2011-11-26 LAB — CBC
HCT: 40.2 % (ref 36.0–46.0)
Hemoglobin: 13.1 g/dL (ref 12.0–15.0)
MCH: 29.4 pg (ref 26.0–34.0)
MCHC: 32.6 g/dL (ref 30.0–36.0)
MCV: 90.3 fL (ref 78.0–100.0)
Platelets: 188 10*3/uL (ref 150–400)
RBC: 4.45 MIL/uL (ref 3.87–5.11)
RDW: 14.4 % (ref 11.5–15.5)
WBC: 13.7 10*3/uL — ABNORMAL HIGH (ref 4.0–10.5)

## 2011-11-26 LAB — DIFFERENTIAL
Basophils Absolute: 0 10*3/uL (ref 0.0–0.1)
Basophils Relative: 0 % (ref 0–1)
Eosinophils Absolute: 0.1 10*3/uL (ref 0.0–0.7)
Eosinophils Relative: 1 % (ref 0–5)
Lymphocytes Relative: 4 % — ABNORMAL LOW (ref 12–46)
Lymphs Abs: 0.5 10*3/uL — ABNORMAL LOW (ref 0.7–4.0)
Monocytes Absolute: 0.6 10*3/uL (ref 0.1–1.0)
Monocytes Relative: 4 % (ref 3–12)
Neutro Abs: 12.5 10*3/uL — ABNORMAL HIGH (ref 1.7–7.7)
Neutrophils Relative %: 91 % — ABNORMAL HIGH (ref 43–77)

## 2011-11-26 LAB — BLOOD GAS, ARTERIAL
Acid-base deficit: 0.3 mmol/L (ref 0.0–2.0)
Bicarbonate: 23.6 mEq/L (ref 20.0–24.0)
Drawn by: 310571
O2 Content: 2 L/min
O2 Saturation: 96.6 %
Patient temperature: 98.6
TCO2: 21 mmol/L (ref 0–100)
pCO2 arterial: 38.2 mmHg (ref 35.0–45.0)
pH, Arterial: 7.407 — ABNORMAL HIGH (ref 7.350–7.400)
pO2, Arterial: 87.4 mmHg (ref 80.0–100.0)

## 2011-11-26 LAB — BASIC METABOLIC PANEL
BUN: 20 mg/dL (ref 6–23)
CO2: 24 mEq/L (ref 19–32)
Calcium: 9.7 mg/dL (ref 8.4–10.5)
Chloride: 98 mEq/L (ref 96–112)
Creatinine, Ser: 1.52 mg/dL — ABNORMAL HIGH (ref 0.50–1.10)
GFR calc Af Amer: 41 mL/min — ABNORMAL LOW (ref >90)
GFR calc non Af Amer: 36 mL/min — ABNORMAL LOW (ref >90)
Glucose, Bld: 96 mg/dL (ref 70–99)
Potassium: 4 mEq/L (ref 3.5–5.1)
Sodium: 133 mEq/L — ABNORMAL LOW (ref 135–145)

## 2011-11-26 LAB — INFLUENZA PANEL BY PCR (TYPE A & B)
Influenza A By PCR: POSITIVE — AB
Influenza B By PCR: NEGATIVE

## 2011-11-26 MED ORDER — METHYLPREDNISOLONE SODIUM SUCC 125 MG IJ SOLR
80.0000 mg | Freq: Four times a day (QID) | INTRAMUSCULAR | Status: DC
  Administered 2011-11-26 – 2011-11-29 (×12): 80 mg via INTRAVENOUS
  Filled 2011-11-26 (×15): qty 1.28

## 2011-11-26 MED ORDER — BUDESONIDE-FORMOTEROL FUMARATE 80-4.5 MCG/ACT IN AERO
2.0000 | INHALATION_SPRAY | Freq: Two times a day (BID) | RESPIRATORY_TRACT | Status: DC
  Administered 2011-11-26 – 2011-12-01 (×10): 2 via RESPIRATORY_TRACT
  Filled 2011-11-26: qty 6.9

## 2011-11-26 MED ORDER — DOCUSATE SODIUM 100 MG PO CAPS
100.0000 mg | ORAL_CAPSULE | Freq: Two times a day (BID) | ORAL | Status: DC
  Administered 2011-11-26 – 2011-12-01 (×10): 100 mg via ORAL
  Filled 2011-11-26 (×11): qty 1

## 2011-11-26 MED ORDER — ONDANSETRON HCL 4 MG/2ML IJ SOLN: 4.0000 mg | Freq: Four times a day (QID) | INTRAMUSCULAR | Status: DC | PRN

## 2011-11-26 MED ORDER — SODIUM CHLORIDE 0.9 % IV SOLN
INTRAVENOUS | Status: DC
  Administered 2011-11-26 – 2011-11-30 (×7): via INTRAVENOUS

## 2011-11-26 MED ORDER — GUAIFENESIN ER 600 MG PO TB12
600.0000 mg | ORAL_TABLET | Freq: Two times a day (BID) | ORAL | Status: DC
  Administered 2011-11-26 – 2011-12-01 (×10): 600 mg via ORAL
  Filled 2011-11-26 (×11): qty 1

## 2011-11-26 MED ORDER — IPRATROPIUM BROMIDE 0.02 % IN SOLN
0.5000 mg | Freq: Four times a day (QID) | RESPIRATORY_TRACT | Status: DC
  Administered 2011-11-26 – 2011-12-01 (×19): 0.5 mg via RESPIRATORY_TRACT
  Filled 2011-11-26 (×20): qty 2.5

## 2011-11-26 MED ORDER — HYDROCHLOROTHIAZIDE 25 MG PO TABS
25.0000 mg | ORAL_TABLET | Freq: Every day | ORAL | Status: DC
  Administered 2011-11-26 – 2011-12-01 (×6): 25 mg via ORAL
  Filled 2011-11-26 (×6): qty 1

## 2011-11-26 MED ORDER — ALBUTEROL SULFATE (5 MG/ML) 0.5% IN NEBU
INHALATION_SOLUTION | RESPIRATORY_TRACT | Status: AC
  Filled 2011-11-26: qty 2

## 2011-11-26 MED ORDER — MAGNESIUM SULFATE 40 MG/ML IJ SOLN
2.0000 g | INTRAMUSCULAR | Status: AC
  Administered 2011-11-26: 2 g via INTRAVENOUS
  Filled 2011-11-26: qty 50

## 2011-11-26 MED ORDER — ENOXAPARIN SODIUM 40 MG/0.4ML ~~LOC~~ SOLN
40.0000 mg | SUBCUTANEOUS | Status: DC
  Administered 2011-11-26 – 2011-11-30 (×5): 40 mg via SUBCUTANEOUS
  Filled 2011-11-26 (×6): qty 0.4

## 2011-11-26 MED ORDER — ALBUTEROL SULFATE (5 MG/ML) 0.5% IN NEBU
2.5000 mg | INHALATION_SOLUTION | RESPIRATORY_TRACT | Status: DC | PRN
  Administered 2011-11-26 – 2011-11-29 (×4): 2.5 mg via RESPIRATORY_TRACT
  Filled 2011-11-26 (×5): qty 0.5

## 2011-11-26 MED ORDER — AMLODIPINE BESYLATE 10 MG PO TABS
10.0000 mg | ORAL_TABLET | Freq: Every day | ORAL | Status: DC
  Administered 2011-11-26 – 2011-12-01 (×6): 10 mg via ORAL
  Filled 2011-11-26 (×6): qty 1

## 2011-11-26 MED ORDER — ALBUTEROL (5 MG/ML) CONTINUOUS INHALATION SOLN: 10.0000 mg/h | INHALATION_SOLUTION | RESPIRATORY_TRACT | Status: DC

## 2011-11-26 MED ORDER — OXYCODONE HCL 5 MG PO TABS
5.0000 mg | ORAL_TABLET | ORAL | Status: DC | PRN
  Administered 2011-11-27: 5 mg via ORAL
  Filled 2011-11-26: qty 1

## 2011-11-26 MED ORDER — MORPHINE SULFATE 2 MG/ML IJ SOLN
0.5000 mg | INTRAMUSCULAR | Status: DC | PRN
  Administered 2011-11-27: 0.5 mg via INTRAVENOUS
  Filled 2011-11-26: qty 1

## 2011-11-26 MED ORDER — LOPERAMIDE HCL 2 MG PO CAPS: 2.0000 mg | ORAL_CAPSULE | ORAL | Status: DC | PRN

## 2011-11-26 MED ORDER — ACETAMINOPHEN 325 MG PO TABS: 650.0000 mg | ORAL_TABLET | Freq: Four times a day (QID) | ORAL | Status: DC | PRN

## 2011-11-26 MED ORDER — ALBUTEROL SULFATE (5 MG/ML) 0.5% IN NEBU
2.5000 mg | INHALATION_SOLUTION | Freq: Four times a day (QID) | RESPIRATORY_TRACT | Status: DC
  Administered 2011-11-26 – 2011-12-01 (×18): 2.5 mg via RESPIRATORY_TRACT
  Filled 2011-11-26 (×18): qty 0.5

## 2011-11-26 MED ORDER — ACETAMINOPHEN 650 MG RE SUPP: 650.0000 mg | Freq: Four times a day (QID) | RECTAL | Status: DC | PRN

## 2011-11-26 MED ORDER — DEXTROMETHORPHAN POLISTIREX 30 MG/5ML PO LQCR
60.0000 mg | ORAL | Status: DC | PRN
  Administered 2011-11-26 – 2011-11-27 (×2): 60 mg via ORAL
  Filled 2011-11-26 (×3): qty 10

## 2011-11-26 MED ORDER — METHYLPREDNISOLONE SODIUM SUCC 125 MG IJ SOLR
125.0000 mg | Freq: Once | INTRAMUSCULAR | Status: AC
  Administered 2011-11-26: 125 mg via INTRAVENOUS
  Filled 2011-11-26: qty 2

## 2011-11-26 MED ORDER — ONDANSETRON HCL 4 MG PO TABS: 4.0000 mg | ORAL_TABLET | Freq: Four times a day (QID) | ORAL | Status: DC | PRN

## 2011-11-26 NOTE — H&P (Signed)
PCP:  Clyda Greener  Chief Complaint:  Cough, shortness of breath  HPI: Patient is a pleasant 60 year old black woman well-known to our service for an admission last month for a COPD exacerbation. She states that that episode resolved completely. 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 For the past 5 days she has been complaining of progressively increasing shortness of breath, nonproductive cough. She went to visit her primary care physician 3 days ago who started her on a prednisone taper and Septra. Because she was not improving she decided to come into the emergency department, we were asked to admit her for further evaluation and management after initial treatment in the ED has provided her with no relief.  Allergies:   Allergies  Allergen Reactions  . Clonidine Derivatives     Coughing shortness of breath  . Codeine Other (See Comments)    Reaction: hallucination   . Lisinopril   . Penicillins Hives  . Procaine Rash      Past Medical History  Diagnosis Date  . Hypertension   . COPD (chronic obstructive pulmonary disease)   . Asthma     Past Surgical History  Procedure Date  . Abdominal hysterectomy     Prior to Admission medications   Medication Sig Start Date End Date Taking? Authorizing Provider  albuterol (PROVENTIL HFA;VENTOLIN HFA) 108 (90 BASE) MCG/ACT inhaler Inhale 2 puffs into the lungs every 6 (six) hours as needed. For shortness of breath     Yes Historical Provider, MD  albuterol (PROVENTIL) (2.5 MG/3ML) 0.083% nebulizer solution Take 2.5 mg by nebulization every 6 (six) hours as needed. For shortness of breath    Yes Historical Provider, MD  amLODipine (NORVASC) 10 MG tablet Take 10 mg by mouth daily.     Yes Historical Provider, MD  budesonide-formoterol (SYMBICORT) 80-4.5 MCG/ACT inhaler Inhale 2 puffs into the lungs 2 (two) times daily. 10/23/11 10/22/12 Yes  Ripudeep Jenna Luo, MD  dextromethorphan (DELSYM) 30 MG/5ML liquid Take 60 mg by mouth as needed.     Yes Historical Provider, MD  hydrochlorothiazide (HYDRODIURIL) 25 MG tablet Take 25 mg by mouth daily.    Yes Historical Provider, MD  predniSONE (DELTASONE) 20 MG tablet Take 20 mg by mouth daily. Patient takes 2 tablets by mouth daily for 3 days,then 1 tablet daily for 4 days. Then stop.  11/25/11 12/02/11 Yes Historical Provider, MD  tiotropium (SPIRIVA) 18 MCG inhalation capsule Place 18 mcg into inhaler and inhale daily. For shortness of breath    Yes Historical Provider, MD    Social History:  reports that she has quit smoking. Her smoking use included Cigarettes. She has a 45 pack-year smoking history. She quit smokeless tobacco use about 5 weeks ago. She reports that she does not drink alcohol or use illicit drugs.  Family History  Problem Relation Age of Onset  . Dementia Mother     Review of Systems:  Constitutional: Denies fever, chills, diaphoresis, appetite change and fatigue.  HEENT: Denies photophobia, eye pain, redness, hearing loss, ear pain, congestion, sore throat, rhinorrhea, sneezing, mouth sores, trouble swallowing, neck pain, neck stiffness and tinnitus.   Respiratory: Denies  chest tightness. Cardiovascular: Denies chest pain, palpitations and leg swelling.  Gastrointestinal: Denies nausea, vomiting, abdominal pain, diarrhea, constipation, blood in stool and abdominal distention.  Genitourinary: Denies dysuria, urgency, frequency, hematuria, flank pain and difficulty urinating.  Musculoskeletal: Denies myalgias, back pain,  joint swelling, arthralgias and gait problem.  Skin: Denies pallor, rash and wound.  Neurological: Denies dizziness, seizures, syncope, weakness, light-headedness, numbness and headaches.  Hematological: Denies adenopathy. Easy bruising, personal or family bleeding history  Psychiatric/Behavioral: Denies suicidal ideation, mood changes, confusion,  nervousness, sleep disturbance and agitation   Physical Exam: Blood pressure 134/82, temperature 99.5 F (37.5 C), temperature source Oral, resp. rate 38, SpO2 92.00%. General: Alert, awake, oriented x3, does have some mild to moderate respiratory distress and inability to speak in full sentences. HEENT: Normocephalic, atraumatic, pupils equal round and reactive to light, intact extraocular movements, wears corrective lenses, has very dry mucous membranes, has pharyngeal erythema without exudates. Neck: Supple, no JVD, no lymphadenopathy, no bruits, no goiter. Cardiovascular: Tachycardic but with regular rhythm, no murmurs, rubs or gallops. Lungs: Diffuse bilateral expiratory wheezes. Abdomen: Soft, nontender, nondistended, positive bowel sounds, no masses or organomegaly noted. Extremities: No clubbing, cyanosis or edema, positive pedal pulses. Neurological: Grossly intact and nonfocal.  Labs on Admission:  Results for orders placed during the hospital encounter of 11/26/11 (from the past 48 hour(s))  CBC     Status: Abnormal   Collection Time   11/26/11 12:12 PM      Component Value Range Comment   WBC 13.7 (*) 4.0 - 10.5 (K/uL)    RBC 4.45  3.87 - 5.11 (MIL/uL)    Hemoglobin 13.1  12.0 - 15.0 (g/dL)    HCT 16.1  09.6 - 04.5 (%)    MCV 90.3  78.0 - 100.0 (fL)    MCH 29.4  26.0 - 34.0 (pg)    MCHC 32.6  30.0 - 36.0 (g/dL)    RDW 40.9  81.1 - 91.4 (%)    Platelets 188  150 - 400 (K/uL)   DIFFERENTIAL     Status: Abnormal   Collection Time   11/26/11 12:12 PM      Component Value Range Comment   Neutrophils Relative 91 (*) 43 - 77 (%)    Neutro Abs 12.5 (*) 1.7 - 7.7 (K/uL)    Lymphocytes Relative 4 (*) 12 - 46 (%)    Lymphs Abs 0.5 (*) 0.7 - 4.0 (K/uL)    Monocytes Relative 4  3 - 12 (%)    Monocytes Absolute 0.6  0.1 - 1.0 (K/uL)    Eosinophils Relative 1  0 - 5 (%)    Eosinophils Absolute 0.1  0.0 - 0.7 (K/uL)    Basophils Relative 0  0 - 1 (%)    Basophils Absolute 0.0   0.0 - 0.1 (K/uL)   BASIC METABOLIC PANEL     Status: Abnormal   Collection Time   11/26/11 12:12 PM      Component Value Range Comment   Sodium 133 (*) 135 - 145 (mEq/L)    Potassium 4.0  3.5 - 5.1 (mEq/L)    Chloride 98  96 - 112 (mEq/L)    CO2 24  19 - 32 (mEq/L)    Glucose, Bld 96  70 - 99 (mg/dL)    BUN 20  6 - 23 (mg/dL)    Creatinine, Ser 7.82 (*) 0.50 - 1.10 (mg/dL)    Calcium 9.7  8.4 - 10.5 (mg/dL)    GFR calc non Af Amer 36 (*) >90 (mL/min)    GFR calc Af Amer 41 (*) >90 (mL/min)   BLOOD GAS, ARTERIAL     Status: Abnormal   Collection Time   11/26/11  1:39 PM      Component Value  Range Comment   O2 Content 2.0      Delivery systems NASAL CANNULA      pH, Arterial 7.407 (*) 7.350 - 7.400     pCO2 arterial 38.2  35.0 - 45.0 (mmHg)    pO2, Arterial 87.4  80.0 - 100.0 (mmHg)    Bicarbonate 23.6  20.0 - 24.0 (mEq/L)    TCO2 21.0  0 - 100 (mmol/L)    Acid-base deficit 0.3  0.0 - 2.0 (mmol/L)    O2 Saturation 96.6      Patient temperature 98.6      Collection site LEFT RADIAL      Drawn by [id number]      Sample type ARTERIAL DRAW      Allens test (pass/fail) PASS  PASS      Radiological Exams on Admission: Dg Chest 2 View  11/26/2011  *RADIOLOGY REPORT*  Clinical Data: Cough and shortness of breath.  CHEST - 2 VIEW  Comparison: PA and lateral chest 10/20/2011.  Findings: Lungs are clear.  There is cardiomegaly.  No pneumothorax or effusion.  IMPRESSION: Cardiomegaly without acute disease.  Original Report Authenticated By: Bernadene Bell. Maricela Curet, M.D.    Assessment/Plan Principal Problem:  *COPD with acute exacerbation Active Problems:  Hypertension   #1 COPD with acute exacerbation: Likely exacerbated by upper respiratory tract infection. Check influenza PCR. Start Solu-Medrol, frequent nebulizers. Continue her Symbicort. I see no indication for antibiotics at this time given her clear chest x-ray and no fever. She has a good-looking ABG at this point.  #2  hypertension: Continue home medications.  #3 DVT prophylaxis: Lovenox.  Time Spent on Admission: Approximately 60 minutes.  Chaya Jan [facility] Pager: [phone] 11/26/2011, 2:58 PM

## 2011-11-26 NOTE — ED Notes (Signed)
Admitting MD at bedside.

## 2011-11-26 NOTE — ED Notes (Signed)
Pt back from x-ray.

## 2011-11-26 NOTE — ED Notes (Signed)
Attempted to call report, told nurse will call back.

## 2011-11-26 NOTE — ED Notes (Signed)
Pt presents with c/o shortness of breath, sore throat, headache x 3 days; hx of copd; pt respirations labored, tacypneic with audible wheezing present; using inhalers with no relief

## 2011-11-27 LAB — CBC
Hemoglobin: 12.3 g/dL (ref 12.0–15.0)
MCH: 29.4 pg (ref 26.0–34.0)
MCHC: 32.1 g/dL (ref 30.0–36.0)
Platelets: 180 10*3/uL (ref 150–400)
RBC: 4.19 MIL/uL (ref 3.87–5.11)

## 2011-11-27 LAB — BASIC METABOLIC PANEL
Calcium: 9.2 mg/dL (ref 8.4–10.5)
GFR calc Af Amer: 38 mL/min — ABNORMAL LOW (ref >90)
GFR calc non Af Amer: 33 mL/min — ABNORMAL LOW (ref >90)
Glucose, Bld: 164 mg/dL — ABNORMAL HIGH (ref 70–99)
Potassium: 4.2 mEq/L (ref 3.5–5.1)
Sodium: 134 mEq/L — ABNORMAL LOW (ref 135–145)

## 2011-11-27 MED ORDER — MOXIFLOXACIN HCL IN NACL 400 MG/250ML IV SOLN
400.0000 mg | INTRAVENOUS | Status: DC
  Administered 2011-11-27 – 2011-11-29 (×3): 400 mg via INTRAVENOUS
  Filled 2011-11-27 (×4): qty 250

## 2011-11-27 MED ORDER — BIOTENE DRY MOUTH MT LIQD
15.0000 mL | Freq: Two times a day (BID) | OROMUCOSAL | Status: DC
  Administered 2011-11-27 – 2011-12-01 (×9): 15 mL via OROMUCOSAL

## 2011-11-27 MED ORDER — ALBUTEROL SULFATE (5 MG/ML) 0.5% IN NEBU
INHALATION_SOLUTION | RESPIRATORY_TRACT | Status: AC
  Administered 2011-11-27: 2.5 mg via RESPIRATORY_TRACT
  Filled 2011-11-27: qty 0.5

## 2011-11-27 MED ORDER — PHENOL 1.4 % MT LIQD
2.0000 | OROMUCOSAL | Status: DC | PRN
  Filled 2011-11-27: qty 177

## 2011-11-27 MED ORDER — OSELTAMIVIR PHOSPHATE 75 MG PO CAPS
75.0000 mg | ORAL_CAPSULE | Freq: Every day | ORAL | Status: DC
  Administered 2011-11-27 – 2011-11-30 (×4): 75 mg via ORAL
  Filled 2011-11-27 (×4): qty 1

## 2011-11-27 MED ORDER — DIPHENHYDRAMINE HCL 50 MG/ML IJ SOLN
12.5000 mg | Freq: Once | INTRAMUSCULAR | Status: AC
  Administered 2011-11-27: 12.5 mg via INTRAVENOUS

## 2011-11-27 MED ORDER — DIPHENHYDRAMINE HCL 50 MG/ML IJ SOLN
INTRAMUSCULAR | Status: AC
  Administered 2011-11-27: 12.5 mg via INTRAVENOUS
  Filled 2011-11-27: qty 1

## 2011-11-27 NOTE — Progress Notes (Signed)
UR COMPLETED  

## 2011-11-27 NOTE — Progress Notes (Signed)
Subjective: Patient feeling slightly better today, reports that his breathing more comfortable. She is is still wheezing and required an oxygen supplementation to maintain good O2 sats. Patient experienced low grade fever overnight. Influenza PCR was positive. Patient is complaining of sore throat.  Objective: Vital signs in last 24 hours: Temp:  [97.2 F (36.2 C)-99.5 F (37.5 C)] 97.2 F (36.2 C) (12/20 0520) Pulse Rate:  [82-106] 82  (12/20 0520) Resp:  [20-30] 20  (12/20 0520) BP: (114-135)/(69-77) 114/73 mmHg (12/20 0520) SpO2:  [97 %-99 %] 99 % (12/20 1240) Weight:  [77.111 kg (170 lb)] 170 lb (77.111 kg) (12/19 1645) Weight change:  Last BM Date: 11/26/11  Intake/Output from previous day: 12/19 0701 - 12/20 0700 In: 990 [I.V.:990] Out: -      Physical Exam: General: Alert, awake, oriented x3, in no acute distress. Mild difficulty speaking in full sentences. HEENT: No bruits, no goiter. Heart: Regular rate and rhythm, without murmurs, rubs, gallops. Lungs: Bilateral diffuse expiratory wheezes, fair air movement. Abdomen: Soft, nontender, nondistended, positive bowel sounds. Extremities: No clubbing cyanosis or edema with positive pedal pulses. Neuro: Grossly intact, nonfocal.    Lab Results: Basic Metabolic Panel:  Basename 11/27/11 0315 11/26/11 1212  NA 134* 133*  K 4.2 4.0  CL 99 98  CO2 20 24  GLUCOSE 164* 96  BUN 31* 20  CREATININE 1.62* 1.52*  CALCIUM 9.2 9.7  MG -- --  PHOS -- --   CBC:  Basename 11/27/11 0315 11/26/11 1212  WBC 11.1* 13.7*  NEUTROABS -- 12.5*  HGB 12.3 13.1  HCT 38.3 40.2  MCV 91.4 90.3  PLT 180 188    Studies/Results: Dg Chest 2 View  11/26/2011  *RADIOLOGY REPORT*  Clinical Data: Cough and shortness of breath.  CHEST - 2 VIEW  Comparison: PA and lateral chest 10/20/2011.  Findings: Lungs are clear.  There is cardiomegaly.  No pneumothorax or effusion.  IMPRESSION: Cardiomegaly without acute disease.  Original Report  Authenticated By: Bernadene Bell. Maricela Curet, M.D.    Medications: Scheduled Meds:   . albuterol  2.5 mg Nebulization Q6H  . amLODipine  10 mg Oral Daily  . antiseptic oral rinse  15 mL Mouth Rinse BID  . budesonide-formoterol  2 puff Inhalation BID  . docusate sodium  100 mg Oral BID  . enoxaparin  40 mg Subcutaneous Q24H  . guaiFENesin  600 mg Oral BID  . hydrochlorothiazide  25 mg Oral Daily  . ipratropium  0.5 mg Nebulization Q6H  . magnesium sulfate 1 - 4 g bolus IVPB  2 g Intravenous STAT  . methylPREDNISolone (SOLU-MEDROL) injection  80 mg Intravenous Q6H  . oseltamivir  75 mg Oral Daily   Continuous Infusions:   . sodium chloride 75 mL/hr at 11/27/11 1152  . DISCONTD: albuterol     PRN Meds:.acetaminophen, acetaminophen, albuterol, dextromethorphan, morphine, ondansetron (ZOFRAN) IV, ondansetron, oxyCODONE, phenol, DISCONTD: loperamide  Assessment/Plan: 1-COPD with acute exacerbation: Most likely secondary to influenza. Will continue Solu mitral IV since the patient is still wheezing; continue dual nebulizers treatment and Avelox. Will also continue the budenoside-formoterol.  2-influenza: Continue Tamiflu day 1/5. Continue droplet precautions  3-Hypertension: Stable continue current medications (HCTZ and amlodipine).  4-DVT: Lovenox  5-sore throat: Most likely associated with her cough. Continue Mucinex as needed and will Start Chloraseptic spray.    LOS: 1 day   Javoni Lucken [facility] [phone]  11/27/2011, 2:11 PM

## 2011-11-27 NOTE — Progress Notes (Signed)
Pt tested positive for influenza A. MD on call notified and orders received. Will continue to monitor.

## 2011-11-27 NOTE — Progress Notes (Signed)
Called to room by patient.  Patient in severe respiratory distress.  Stated she did not feel like she could breathe and that she could not swallow because her neck was "closing up."  Patient with severe anxiety.  Vitals obtained.  Filed Vitals:   11/27/11 1700  BP: 139/85  Pulse: 90  Temp:   Resp: 32   Respiratory therapy notified.  Rapid Response RN contacted and Dr. Gwenlyn Perking also notified to see patient.  Patient quickly recovered with nebulizer treatment and cough syrup.  New order received for IV Benadryl and this was administered.  Patient now stable with her breathing.  Will monitor.  Allayne Butcher [facility]  11/27/2011  7:10 PM

## 2011-11-28 LAB — CBC
MCH: 28.9 pg (ref 26.0–34.0)
MCHC: 31.8 g/dL (ref 30.0–36.0)
MCV: 90.9 fL (ref 78.0–100.0)
Platelets: 173 10*3/uL (ref 150–400)

## 2011-11-28 LAB — BASIC METABOLIC PANEL
CO2: 26 mEq/L (ref 19–32)
Calcium: 8.9 mg/dL (ref 8.4–10.5)
Creatinine, Ser: 1.35 mg/dL — ABNORMAL HIGH (ref 0.50–1.10)
GFR calc non Af Amer: 41 mL/min — ABNORMAL LOW (ref >90)
Glucose, Bld: 137 mg/dL — ABNORMAL HIGH (ref 70–99)
Sodium: 138 mEq/L (ref 135–145)

## 2011-11-28 MED ORDER — ALBUTEROL SULFATE (5 MG/ML) 0.5% IN NEBU
INHALATION_SOLUTION | RESPIRATORY_TRACT | Status: AC
  Administered 2011-11-28: 2.5 mg via RESPIRATORY_TRACT
  Filled 2011-11-28: qty 0.5

## 2011-11-28 NOTE — Progress Notes (Signed)
Nutrition Brief Note  - Received referral from RN regarding possible food allergy after episode of difficulty swallowing dinner yesterday. Met with pt who reports she thinks she was eating dinner too fast and that she likely had an esophageal spasm and that the food got caught. Her dinner was pot roast, which has had earlier in the admission without this difficulty, and she has not had any food allergies in the past. Encouraged pt to take more time in eating, chewing foods carefully, and to not eat when coughing or when pt having difficulty breathing r/t not wanting food to go to lung. Encouraged pt to follow up with MD if she has particular concerns with food allergies and that tests can be ordered to figure out if a food allergy is present. Pt continues to think the dinner episode was a combination of sore throat, COPD, and esophageal spasm versus food allergy reaction. Pt otherwise eating excellent, great appetite PTA. No nutrition diagnosis at this time. Pt requested healthy nutrition education r/t unintentional weight gain from steroids, which was provided and handout given. Educational needs addressed. Will monitor.   Dietitian # [phone]

## 2011-11-28 NOTE — Progress Notes (Signed)
Subjective: Slightly better. Still with significant wheezing. Patient without fever, chest pain or abdominal pain. On 11/27/2011 late afternoon patient experienced mild allergic reaction while she was having dinner; with sensation of throat closing and acute resp distress. Patient was evaluated by myself and she'll receive 12.5 mg IV of Benadryl also a nebulizer treatment. Symptoms improved and we were unable of identifying any allergen of her dinner tray.  Objective: Vital signs in last 24 hours: Temp:  [96.9 F (36.1 C)-99.2 F (37.3 C)] 98.8 F (37.1 C) (12/21 1445) Pulse Rate:  [67-105] 77  (12/21 1445) Resp:  [20-22] 20  (12/21 1445) BP: (100-118)/(54-69) 118/67 mmHg (12/21 1445) SpO2:  [96 %-99 %] 98 % (12/21 1445) Weight change:  Last BM Date: 11/26/11  Intake/Output from previous day: 12/20 0701 - 12/21 0700 In: 2802 [I.V.:2802] Out: 1750 [Urine:1750] Total I/O In: 600 [P.O.:600] Out: 800 [Urine:800]   Physical Exam: General: Alert, awake, oriented x3, in no acute distress.  HEENT: No bruits, no goiter. Heart: Regular rate and rhythm, without murmurs, rubs, gallops. Lungs: Bilateral diffuse expiratory wheezes, fair air movement. Scattered rhonchi Abdomen: Soft, nontender, nondistended, positive bowel sounds. Extremities: No clubbing cyanosis or edema with positive pedal pulses. Neuro: Grossly intact, nonfocal.  Lab Results: Basic Metabolic Panel:  Basename 11/28/11 0320 11/27/11 0315  NA 138 134*  K 3.9 4.2  CL 103 99  CO2 26 20  GLUCOSE 137* 164*  BUN 30* 31*  CREATININE 1.35* 1.62*  CALCIUM 8.9 9.2  MG -- --  PHOS -- --   CBC:  Basename 11/28/11 0320 11/27/11 0315 11/26/11 1212  WBC 16.8* 11.1* --  NEUTROABS -- -- 12.5*  HGB 11.4* 12.3 --  HCT 35.9* 38.3 --  MCV 90.9 91.4 --  PLT 173 180 --    Studies/Results: No results found.  Medications: Scheduled Meds:    . albuterol  2.5 mg Nebulization Q6H  . amLODipine  10 mg Oral Daily  .  antiseptic oral rinse  15 mL Mouth Rinse BID  . budesonide-formoterol  2 puff Inhalation BID  . diphenhydrAMINE  12.5 mg Intravenous Once  . docusate sodium  100 mg Oral BID  . enoxaparin  40 mg Subcutaneous Q24H  . guaiFENesin  600 mg Oral BID  . hydrochlorothiazide  25 mg Oral Daily  . ipratropium  0.5 mg Nebulization Q6H  . methylPREDNISolone (SOLU-MEDROL) injection  80 mg Intravenous Q6H  . moxifloxacin  400 mg Intravenous Q24H  . oseltamivir  75 mg Oral Daily   Continuous Infusions:    . sodium chloride 75 mL/hr at 11/28/11 1712   PRN Meds:.acetaminophen, acetaminophen, albuterol, dextromethorphan, morphine, ondansetron (ZOFRAN) IV, ondansetron, oxyCODONE, phenol  Assessment/Plan: 1-COPD with acute exacerbation: Most likely secondary to influenza. Will continue Solumedrol IV; patient still with significant wheezing and SOB. Continue dual nebulizers treatment and Avelox. Will also continue the budenoside-formoterol.  2-influenza: Continue Tamiflu day 2/5. Continue droplet precautions  3-Hypertension: Stable on her current antihypertensive drugs; continue HCTZ and amlodipine.  4-DVT: Continue Lovenox.  5-sore throat: Continue Chloraseptic Spray as needed  6-hyperglycemia: Most likely secondary to the use of steroids. At this point her levels do not require insulin treatment; will continue monitoring.    LOS: 2 days   Gabriela Giannelli [facility] [phone]  11/28/2011, 6:04 PM

## 2011-11-29 MED ORDER — METHYLPREDNISOLONE SODIUM SUCC 125 MG IJ SOLR
80.0000 mg | Freq: Two times a day (BID) | INTRAMUSCULAR | Status: DC
  Administered 2011-11-29 – 2011-11-30 (×2): 80 mg via INTRAVENOUS
  Filled 2011-11-29 (×3): qty 1.28

## 2011-11-29 NOTE — Progress Notes (Signed)
Subjective: Feeling better, no fever, no chest pain. Patient reports less wheezing and he improve in her breathing.  Objective: Vital signs in last 24 hours: Temp:  [98.1 F (36.7 C)-98.3 F (36.8 C)] 98.3 F (36.8 C) (12/22 1400) Pulse Rate:  [61-74] 61  (12/22 1400) Resp:  [18-20] 20  (12/22 1400) BP: (122-128)/(59-74) 128/74 mmHg (12/22 1400) SpO2:  [94 %-98 %] 96 % (12/22 1400) Weight change:  Last BM Date: 11/26/11  Intake/Output from previous day: 12/21 0701 - 12/22 0700 In: 2262 [P.O.:600; I.V.:1662] Out: 1400 [Urine:1400] Total I/O In: 960 [P.O.:960] Out: -    Physical Exam: General: Alert, awake, oriented x3, in no acute distress.  HEENT: No bruits, no goiter. Heart: Regular rate and rhythm, without murmurs, rubs, gallops. Lungs: Bilateral diffuse expiratory wheezes (he improve in comparison to yesterday 11/28/2011 exam), fair air movement. Scattered rhonchi Abdomen: Soft, nontender, nondistended, positive bowel sounds. Extremities: No clubbing cyanosis or edema with positive pedal pulses. Neuro: Grossly intact, nonfocal.  Lab Results: Basic Metabolic Panel:  Basename 11/28/11 0320 11/27/11 0315  NA 138 134*  K 3.9 4.2  CL 103 99  CO2 26 20  GLUCOSE 137* 164*  BUN 30* 31*  CREATININE 1.35* 1.62*  CALCIUM 8.9 9.2  MG -- --  PHOS -- --   CBC:  Basename 11/28/11 0320 11/27/11 0315  WBC 16.8* 11.1*  NEUTROABS -- --  HGB 11.4* 12.3  HCT 35.9* 38.3  MCV 90.9 91.4  PLT 173 180    Studies/Results: No results found.  Medications: Scheduled Meds:    . albuterol  2.5 mg Nebulization Q6H  . amLODipine  10 mg Oral Daily  . antiseptic oral rinse  15 mL Mouth Rinse BID  . budesonide-formoterol  2 puff Inhalation BID  . docusate sodium  100 mg Oral BID  . enoxaparin  40 mg Subcutaneous Q24H  . guaiFENesin  600 mg Oral BID  . hydrochlorothiazide  25 mg Oral Daily  . ipratropium  0.5 mg Nebulization Q6H  . methylPREDNISolone (SOLU-MEDROL) injection   80 mg Intravenous Q12H  . moxifloxacin  400 mg Intravenous Q24H  . oseltamivir  75 mg Oral Daily  . DISCONTD: methylPREDNISolone (SOLU-MEDROL) injection  80 mg Intravenous Q6H   Continuous Infusions:    . sodium chloride 50 mL/hr at 11/29/11 1208   PRN Meds:.acetaminophen, acetaminophen, albuterol, dextromethorphan, morphine, ondansetron (ZOFRAN) IV, ondansetron, oxyCODONE, phenol  Assessment/Plan: 1-COPD with acute exacerbation: Most likely secondary to influenza and bronchiectasis. Wheezing in brief now improving; will decrease Solu-Medrol to 80 mg every 12 hours and continue nebulizer treatments and antibiotics. Will also continue Tamiflu, currently day 3/5.  2-influenza: Continue Tamiflu day 3/5. Continue droplet precautions for one more day.  3-Hypertension: Continue HCTZ and amlodipine.  4-DVT: continue lovenox.  5-sore throat: Continue Chloraseptic Spray as needed  6-hyperglycemia: Most likely secondary to the use of steroids. At this point her levels do not require insulin treatment; will continue monitoring.    LOS: 3 days   Tahje Borawski [facility] [phone]  11/29/2011, 3:14 PM

## 2011-11-29 NOTE — ED Provider Notes (Signed)
History     CSN: [id number]  Arrival date & time 11/26/11  1135   First MD Initiated Contact with Patient 11/26/11 1148      Chief Complaint  Patient presents with  . Shortness of Breath    (Consider location/radiation/quality/duration/timing/severity/associated sxs/prior treatment) HPI The patient presents to the ER with shortness of breath that has increased her in the last 3 days.  Patient states she used her home nebulizer treatment did not seem to improve her condition.  Patient denies chest pain, abdominal pain, headache, or back pain.  She says she was recently in the hospital for similar symptoms.  Past Medical History  Diagnosis Date  . Hypertension   . COPD (chronic obstructive pulmonary disease)   . Asthma     Past Surgical History  Procedure Date  . Abdominal hysterectomy     Family History  Problem Relation Age of Onset  . Dementia Mother     History  Substance Use Topics  . Smoking status: Former Smoker -- 1.0 packs/day for 45 years    Types: Cigarettes    Quit date: 10/27/2011  . Smokeless tobacco: Former Neurosurgeon    Quit date: 10/21/2011  . Alcohol Use: No    OB History    Grav Para Term Preterm Abortions TAB SAB Ect Mult Living                  Review of Systems  Constitutional: Negative for fever and fatigue.  HENT: Negative for congestion and rhinorrhea.   Eyes: Negative for discharge.  Respiratory: Positive for cough, shortness of breath and wheezing.   Cardiovascular: Negative for chest pain.  Gastrointestinal: Negative for abdominal distention.  Genitourinary: Negative for dysuria.  Musculoskeletal: Negative for back pain.  Neurological: Negative for syncope.  Psychiatric/Behavioral: Negative for agitation.    Allergies  Clonidine derivatives; Codeine; Lisinopril; Penicillins; and Procaine  Home Medications  No current outpatient prescriptions on file.  BP 122/59  Pulse 64  Temp(Src) 98.3 F (36.8 C) (Oral)  Resp 18  Ht 5'  5" (1.651 m)  Wt 170 lb (77.111 kg)  BMI 28.29 kg/m2  SpO2 97%  Physical Exam  Constitutional: She is oriented to person, place, and time. She appears well-developed and well-nourished. She appears distressed.  HENT:  Head: Normocephalic and atraumatic.  Eyes: Pupils are equal, round, and reactive to light.  Neck: Normal range of motion. Neck supple.  Cardiovascular: Regular rhythm.  Exam reveals no gallop and no friction rub.   No murmur heard. Pulmonary/Chest: She is in respiratory distress. She has wheezes.  Neurological: She is alert and oriented to person, place, and time.  Skin: Skin is warm and dry. No rash noted.    ED Course  Procedures (including critical care time)  Labs Reviewed  CBC - Abnormal; Notable for the following:    WBC 13.7 (*)    All other components within normal limits  DIFFERENTIAL - Abnormal; Notable for the following:    Neutrophils Relative 91 (*)    Neutro Abs 12.5 (*)    Lymphocytes Relative 4 (*)    Lymphs Abs 0.5 (*)    All other components within normal limits  BASIC METABOLIC PANEL - Abnormal; Notable for the following:    Sodium 133 (*)    Creatinine, Ser 1.52 (*)    GFR calc non Af Amer 36 (*)    GFR calc Af Amer 41 (*)    All other components within normal limits  BLOOD GAS, ARTERIAL -  Abnormal; Notable for the following:    pH, Arterial 7.407 (*)    All other components within normal limits  INFLUENZA PANEL BY PCR - Abnormal; Notable for the following:    Influenza A By PCR POSITIVE (*)    All other components within normal limits  BASIC METABOLIC PANEL - Abnormal; Notable for the following:    Sodium 134 (*)    Glucose, Bld 164 (*)    BUN 31 (*)    Creatinine, Ser 1.62 (*)    GFR calc non Af Amer 33 (*)    GFR calc Af Amer 38 (*)    All other components within normal limits  CBC - Abnormal; Notable for the following:    WBC 11.1 (*)    All other components within normal limits  CBC - Abnormal; Notable for the following:     WBC 16.8 (*)    Hemoglobin 11.4 (*)    HCT 35.9 (*)    All other components within normal limits  BASIC METABOLIC PANEL - Abnormal; Notable for the following:    Glucose, Bld 137 (*)    BUN 30 (*)    Creatinine, Ser 1.35 (*)    GFR calc non Af Amer 41 (*)    GFR calc Af Amer 48 (*)    All other components within normal limits   No results found.   1. COPD with acute exacerbation     Patient is admitted for further treatment of her COPD exacerbation.  MDM  MDM Reviewed: previous chart, nursing note and vitals Reviewed previous: labs, ECG and x-ray Interpretation: labs, x-ray and ultrasound Consults: admitting MD            Carlyle Dolly, PA-C 11/29/11 0703

## 2011-11-30 LAB — BASIC METABOLIC PANEL
BUN: 21 mg/dL (ref 6–23)
Calcium: 9.1 mg/dL (ref 8.4–10.5)
GFR calc Af Amer: 68 mL/min — ABNORMAL LOW (ref >90)
GFR calc non Af Amer: 59 mL/min — ABNORMAL LOW (ref >90)
Glucose, Bld: 134 mg/dL — ABNORMAL HIGH (ref 70–99)
Potassium: 2.9 mEq/L — ABNORMAL LOW (ref 3.5–5.1)
Sodium: 141 mEq/L (ref 135–145)

## 2011-11-30 LAB — CBC
Hemoglobin: 11.8 g/dL — ABNORMAL LOW (ref 12.0–15.0)
MCH: 29.4 pg (ref 26.0–34.0)
MCHC: 32 g/dL (ref 30.0–36.0)
Platelets: 167 10*3/uL (ref 150–400)

## 2011-11-30 MED ORDER — OSELTAMIVIR PHOSPHATE 75 MG PO CAPS
75.0000 mg | ORAL_CAPSULE | Freq: Two times a day (BID) | ORAL | Status: DC
  Administered 2011-11-30 – 2011-12-01 (×2): 75 mg via ORAL
  Filled 2011-11-30 (×3): qty 1

## 2011-11-30 MED ORDER — MOXIFLOXACIN HCL 400 MG PO TABS
400.0000 mg | ORAL_TABLET | Freq: Every day | ORAL | Status: DC
  Administered 2011-11-30: 400 mg via ORAL
  Filled 2011-11-30 (×2): qty 1

## 2011-11-30 MED ORDER — MOXIFLOXACIN HCL 400 MG PO TABS
400.0000 mg | ORAL_TABLET | Freq: Every day | ORAL | Status: DC
  Filled 2011-11-30: qty 1

## 2011-11-30 MED ORDER — PREDNISONE 50 MG PO TABS
80.0000 mg | ORAL_TABLET | Freq: Every day | ORAL | Status: DC
  Administered 2011-12-01: 80 mg via ORAL
  Filled 2011-11-30: qty 1

## 2011-11-30 NOTE — Progress Notes (Signed)
Pt walked in hall about 100 ft with out oxygen, O2 sats were 90 to 92%.

## 2011-11-30 NOTE — Progress Notes (Signed)
PHARMACIST - PHYSICIAN COMMUNICATION DR:   [facility] team CONCERNING: Antibiotic IV to Oral Route Change Policy  RECOMMENDATION: This patient is receiving Avelox by the intravenous route.  Based on criteria approved by the Pharmacy and Therapeutics Committee, the antibiotic(s) is/are being converted to the equivalent oral dose form(s).   DESCRIPTION: These criteria include:  Patient being treated for a respiratory tract infection, urinary tract infection, or cellulitis  The patient is not neutropenic and does not exhibit a GI malabsorption state  The patient is eating (either orally or via tube) and/or has been taking other orally administered medications for a least 24 hours  The patient is improving clinically and has a Tmax < 100.5  If you have questions about this conversion, please contact the Pharmacy Department  []   [phone] )  [facility] []   [phone] )  [facility]  []   [phone] )  [facility] [x]   [phone] )  [facility]

## 2011-11-30 NOTE — Progress Notes (Signed)
Subjective: Feeling a lot better, no fever, no chest pain. Patient breathing significantly improve and also with significant decrease in her wheezing. Good O2 sats on room air.  Objective: Vital signs in last 24 hours: Temp:  [98.7 F (37.1 C)-98.8 F (37.1 C)] 98.8 F (37.1 C) (12/23 0500) Pulse Rate:  [57-61] 57  (12/23 0500) Resp:  [20] 20  (12/23 0500) BP: (124-135)/(71-76) 135/76 mmHg (12/23 0500) SpO2:  [94 %-98 %] 96 % (12/23 1255) Weight change:  Last BM Date: 11/29/11  Intake/Output from previous day: 12/22 0701 - 12/23 0700 In: 3376.7 [P.O.:1920; I.V.:1206.7; IV Piggyback:250] Out: 1550 [Urine:1550] Total I/O In: 240 [P.O.:240] Out: -    Physical Exam: General: Alert, awake, oriented x3, in no acute distress.  HEENT: No bruits, no goiter. Heart: Regular rate and rhythm, without murmurs, rubs, gallops. Lungs: Significant improvement in patient's air movement bilaterally, scattered or wheezing no rhonchi. Abdomen: Soft, nontender, nondistended, positive bowel sounds. Extremities: No clubbing cyanosis or edema with positive pedal pulses. Neuro: Grossly intact, nonfocal.  Lab Results: Basic Metabolic Panel:  Basename 11/30/11 0415 11/28/11 0320  NA 141 138  K 2.9* 3.9  CL 98 103  CO2 35* 26  GLUCOSE 134* 137*  BUN 21 30*  CREATININE 1.00 1.35*  CALCIUM 9.1 8.9  MG -- --  PHOS -- --   CBC:  Basename 11/30/11 0415 11/28/11 0320  WBC 11.7* 16.8*  NEUTROABS -- --  HGB 11.8* 11.4*  HCT 36.9 35.9*  MCV 91.8 90.9  PLT 167 173    Studies/Results: No results found.  Medications: Scheduled Meds:    . albuterol  2.5 mg Nebulization Q6H  . amLODipine  10 mg Oral Daily  . antiseptic oral rinse  15 mL Mouth Rinse BID  . budesonide-formoterol  2 puff Inhalation BID  . docusate sodium  100 mg Oral BID  . enoxaparin  40 mg Subcutaneous Q24H  . guaiFENesin  600 mg Oral BID  . hydrochlorothiazide  25 mg Oral Daily  . ipratropium  0.5 mg Nebulization Q6H    . moxifloxacin  400 mg Oral q1800  . oseltamivir  75 mg Oral BID  . predniSONE  80 mg Oral Q breakfast  . DISCONTD: methylPREDNISolone (SOLU-MEDROL) injection  80 mg Intravenous Q6H  . DISCONTD: methylPREDNISolone (SOLU-MEDROL) injection  80 mg Intravenous Q12H  . DISCONTD: moxifloxacin  400 mg Intravenous Q24H  . DISCONTD: moxifloxacin  400 mg Oral q1800  . DISCONTD: oseltamivir  75 mg Oral Daily   Continuous Infusions:    . DISCONTD: sodium chloride 50 mL/hr at 11/30/11 0929   PRN Meds:.acetaminophen, acetaminophen, albuterol, dextromethorphan, morphine, ondansetron (ZOFRAN) IV, ondansetron, oxyCODONE, phenol  Assessment/Plan: 1-COPD with acute exacerbation: Most likely secondary to influenza and bronchiectasis. Wheezing improve and now maintaining good oxygen saturation without oxygen. Will change IV Solu Medrol to prednisone by mouth continue nebulizer treatment and Symbicort. Continue Tamiflu; home tomorrow.  2-influenza: Continue Tamiflu.  3-Hypertension: Continue HCTZ and amlodipine.  4-DVT: continue lovenox.  5-sore throat: Continue Chloraseptic Spray as needed  6-hyperglycemia: Aspect in that her hypoglycemia will correct once the steroids are tapered off.   LOS: 4 days   Rhodesia Stanger [facility]ist [phone]  11/30/2011, 2:29 PM

## 2011-11-30 NOTE — ED Provider Notes (Signed)
Medical screening examination/treatment/procedure(s) were conducted as a shared visit with non-physician practitioner(s) and myself.  I personally evaluated the patient during the encounter  Hx COPD with wheezing, mild respiratory distress and coughing.  Diffuse wheezing throughout, speaking in short sentences.  Glynn Octave, MD 11/30/11 [phone]

## 2011-12-01 MED ORDER — PHENOL 1.4 % MT LIQD: 2.0000 | OROMUCOSAL | Status: DC | PRN

## 2011-12-01 MED ORDER — PREDNISONE 20 MG PO TABS: ORAL_TABLET | ORAL | Status: DC

## 2011-12-01 MED ORDER — OSELTAMIVIR PHOSPHATE 75 MG PO CAPS: 75.0000 mg | ORAL_CAPSULE | Freq: Two times a day (BID) | ORAL | Status: AC

## 2011-12-01 MED ORDER — ACETAMINOPHEN 500 MG PO TABS: 500.0000 mg | ORAL_TABLET | Freq: Four times a day (QID) | ORAL | Status: AC | PRN

## 2011-12-01 MED ORDER — MOXIFLOXACIN HCL 400 MG PO TABS: 400.0000 mg | ORAL_TABLET | Freq: Every day | ORAL | Status: AC

## 2011-12-01 NOTE — Discharge Summary (Signed)
Physician Discharge Summary  Patient ID: Barbara [name] MRN: [id number] DOB/AGE: 1951/12/06 60 y.o.  Admit date: 11/26/2011 Discharge date: 12/01/2011  Primary Care Physician:  Burtis Junes, MD   Discharge Diagnoses:   1-COPD exacerbation 2-influenza 3-bronchiectasis  4-hypertension 5-tobacco abuse 6-hyperglycemia   Present on Admission:  .COPD with acute exacerbation .Hypertension  Current Discharge Medication List    START taking these medications   Details  acetaminophen (TYLENOL) 500 MG tablet Take 1 tablet (500 mg total) by mouth every 6 (six) hours as needed for pain or fever (or Fever >/= 101). Qty: 30 tablet    moxifloxacin (AVELOX) 400 MG tablet Take 1 tablet (400 mg total) by mouth daily at 6 PM. Qty: 5 tablet, Refills: 0    oseltamivir (TAMIFLU) 75 MG capsule Take 1 capsule (75 mg total) by mouth 2 (two) times daily. Qty: 4 capsule, Refills: 0    phenol (CHLORASEPTIC) 1.4 % LIQD Use as directed 2 sprays in the mouth or throat every 4 (four) hours as needed.      CONTINUE these medications which have CHANGED   Details  predniSONE (DELTASONE) 20 MG tablet Take 4 tabs by mouth for 1 day; then 3 tabs by mouth X 2 days; then 2 tabs by mouth x 2 days; then 1 tab by mouth x 2 days; then 1/2 tab by mouth x 2 days and stop. Qty: 18 tablet, Refills: 0      CONTINUE these medications which have NOT CHANGED   Details  albuterol (PROVENTIL HFA;VENTOLIN HFA) 108 (90 BASE) MCG/ACT inhaler Inhale 2 puffs into the lungs every 6 (six) hours as needed. For shortness of breath      albuterol (PROVENTIL) (2.5 MG/3ML) 0.083% nebulizer solution Take 2.5 mg by nebulization every 6 (six) hours as needed. For shortness of breath     amLODipine (NORVASC) 10 MG tablet Take 10 mg by mouth daily.      budesonide-formoterol (SYMBICORT) 80-4.5 MCG/ACT inhaler Inhale 2 puffs into the lungs 2 (two) times daily. Qty: 1 Inhaler, Refills: 3    dextromethorphan (DELSYM) 30 MG/5ML  liquid Take 60 mg by mouth as needed.      hydrochlorothiazide (HYDRODIURIL) 25 MG tablet Take 25 mg by mouth daily.     tiotropium (SPIRIVA) 18 MCG inhalation capsule Place 18 mcg into inhaler and inhale daily. For shortness of breath         Disposition and Follow-up:  Patient has been discharged in a stable and improved condition, coronary no complaining of any chest pain, good oxygen saturation on room air and we just minimal wheezing. Patient is not febrile. She has been instructed to follow with primary care physician over the next 7-10 days; and has also been advised to follow discharge instructions and to take her medications as prescribed. Patient advised to follow a low-sodium diet and to increase her activity slowly.  Consults:   None   Significant Diagnostic Studies:  Dg Chest 2 View  11/26/2011  *RADIOLOGY REPORT*  Clinical Data: Cough and shortness of breath.  CHEST - 2 VIEW  Comparison: PA and lateral chest 10/20/2011.  Findings: Lungs are clear.  There is cardiomegaly.  No pneumothorax or effusion.  IMPRESSION: Cardiomegaly without acute disease.  Original Report Authenticated By: Bernadene Bell. D'ALESSIO, M.D.    Brief H and P: 60 year old black woman well-known to our service for an admission last month for a COPD exacerbation. 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 For the past 5 days she has been complaining of progressively increasing shortness of breath, nonproductive cough. She went to visit her primary care physician 3 days ago who started her on a prednisone taper and Septra. Because she was not improving she decided to come into the emergency department, we were asked to admit her for further evaluation and management after initial treatment in the ED failed to provide any relief.   Hospital Course:  1-COPD exacerbation: Most likely secondary to influenza on bronchiectasis.  Patient initially place on SoluMedrol, IV antibiotics and Tamiflu. After 96 hours of aggressive treatment with these medications patient's symptoms finally improved enough to discharge her safely home, with tapering prednisone and antibiotics by mouth to finish treatment. Patient advised to stop smoking and to arrange followup with PCP over the next 7-10 days. Patient will continue using Symbicort and also when necessary albuterol for her chronic pulmonary disease.  2-influenza: Patient will finish her Tamiflu therapy on 12/26 2012.  3-Bronchiectasis: Do to her COPD and chronic tobacco abuse. She had been advised to stop smoking and to finish antibiotics as instructed.  4-hypertension: Stable and well controlled. Patient advised to follow a low-sodium diet and to continue taking her amlodipine and HCTZ  as previously prescribed.   5-tobacco abuse: Smoking cessation counseling was provided, patient in agreement to stop smoking. She had a prescription for nicotine patch at home and is planning to use it as needed but in [place] in process.  6-hyperglycemia: No history of diabetes; as soon as the steroids were tapered off patient will show her return to normal limits.   7-dehydration: Secondary to poor intake; now resolved after IV fluid resuscitation.  8-acute kidney injury: Secondary to prerenal azotemia due to poor intake and dehydration; resolved with IV fluids. At discharge kidney function within normal limits.  Time spent on Discharge: 40 minutes  Signed: Lyle Niblett 12/01/2011, 12:10 PM

## 2011-12-01 NOTE — Progress Notes (Signed)
Qualifies for indigent funds.scripts sent to pharmacy.Nurse/MD updated.Informed patient of pharmacy policy.

## 2012-02-10 ENCOUNTER — Emergency Department (HOSPITAL_COMMUNITY): Payer: Self-pay

## 2012-02-10 ENCOUNTER — Inpatient Hospital Stay (HOSPITAL_COMMUNITY)
Admission: EM | Admit: 2012-02-10 | Discharge: 2012-02-12 | DRG: 192 | Disposition: A | Discharge status: Home / Self Care | Payer: Self-pay | Attending: Family Medicine | Admitting: Family Medicine

## 2012-02-10 ENCOUNTER — Encounter (HOSPITAL_COMMUNITY): Payer: Self-pay | Admitting: *Deleted

## 2012-02-10 ENCOUNTER — Other Ambulatory Visit: Payer: Self-pay

## 2012-02-10 DIAGNOSIS — Z87891 Personal history of nicotine dependence: Secondary | ICD-10-CM

## 2012-02-10 DIAGNOSIS — R0602 Shortness of breath: Secondary | ICD-10-CM

## 2012-02-10 DIAGNOSIS — J45901 Unspecified asthma with (acute) exacerbation: Principal | ICD-10-CM | POA: Diagnosis present

## 2012-02-10 DIAGNOSIS — J441 Chronic obstructive pulmonary disease with (acute) exacerbation: Principal | ICD-10-CM | POA: Diagnosis present

## 2012-02-10 DIAGNOSIS — F411 Generalized anxiety disorder: Secondary | ICD-10-CM | POA: Diagnosis not present

## 2012-02-10 DIAGNOSIS — Z720 Tobacco use: Secondary | ICD-10-CM | POA: Diagnosis present

## 2012-02-10 DIAGNOSIS — I10 Essential (primary) hypertension: Secondary | ICD-10-CM | POA: Diagnosis present

## 2012-02-10 LAB — BASIC METABOLIC PANEL
CO2: 28 mEq/L (ref 19–32)
Calcium: 9.6 mg/dL (ref 8.4–10.5)
Chloride: 100 mEq/L (ref 96–112)
Glucose, Bld: 100 mg/dL — ABNORMAL HIGH (ref 70–99)
Potassium: 4.3 mEq/L (ref 3.5–5.1)
Sodium: 137 mEq/L (ref 135–145)

## 2012-02-10 LAB — CBC
Platelets: 285 10*3/uL (ref 150–400)
RBC: 4.48 MIL/uL (ref 3.87–5.11)
RDW: 13.8 % (ref 11.5–15.5)
WBC: 10 10*3/uL (ref 4.0–10.5)

## 2012-02-10 LAB — DIFFERENTIAL
Basophils Absolute: 0 10*3/uL (ref 0.0–0.1)
Eosinophils Relative: 4 % (ref 0–5)
Lymphocytes Relative: 18 % (ref 12–46)
Lymphs Abs: 1.8 10*3/uL (ref 0.7–4.0)
Neutro Abs: 7 10*3/uL (ref 1.7–7.7)
Neutrophils Relative %: 71 % (ref 43–77)

## 2012-02-10 MED ORDER — LEVOFLOXACIN IN D5W 750 MG/150ML IV SOLN
750.0000 mg | INTRAVENOUS | Status: DC
  Administered 2012-02-10: 750 mg via INTRAVENOUS
  Filled 2012-02-10 (×2): qty 150

## 2012-02-10 MED ORDER — SODIUM CHLORIDE 0.9 % IV SOLN: INTRAVENOUS | Status: DC

## 2012-02-10 MED ORDER — ALBUTEROL (5 MG/ML) CONTINUOUS INHALATION SOLN
10.0000 mg/h | INHALATION_SOLUTION | RESPIRATORY_TRACT | Status: DC
  Administered 2012-02-10 (×2): 10 mg/h via RESPIRATORY_TRACT

## 2012-02-10 MED ORDER — IPRATROPIUM BROMIDE 0.02 % IN SOLN
0.5000 mg | Freq: Once | RESPIRATORY_TRACT | Status: AC
  Administered 2012-02-10: 0.5 mg via RESPIRATORY_TRACT
  Filled 2012-02-10: qty 2.5

## 2012-02-10 MED ORDER — ALBUTEROL (5 MG/ML) CONTINUOUS INHALATION SOLN
10.0000 mg/h | INHALATION_SOLUTION | RESPIRATORY_TRACT | Status: DC
  Administered 2012-02-10: 10 mg/h via RESPIRATORY_TRACT

## 2012-02-10 MED ORDER — SODIUM CHLORIDE 0.9 % IV BOLUS (SEPSIS)
500.0000 mL | Freq: Once | INTRAVENOUS | Status: AC
  Administered 2012-02-10: 1000 mL via INTRAVENOUS

## 2012-02-10 MED ORDER — MAGNESIUM SULFATE 40 MG/ML IJ SOLN
2.0000 g | Freq: Once | INTRAMUSCULAR | Status: AC
  Administered 2012-02-10: 2 g via INTRAVENOUS
  Filled 2012-02-10: qty 50

## 2012-02-10 MED ORDER — ALBUTEROL (5 MG/ML) CONTINUOUS INHALATION SOLN: 10.0000 mg/h | INHALATION_SOLUTION | Freq: Once | RESPIRATORY_TRACT | Status: DC

## 2012-02-10 MED ORDER — METHYLPREDNISOLONE SODIUM SUCC 125 MG IJ SOLR
125.0000 mg | Freq: Once | INTRAMUSCULAR | Status: AC
  Administered 2012-02-10: 125 mg via INTRAVENOUS
  Filled 2012-02-10: qty 2

## 2012-02-10 NOTE — ED Provider Notes (Signed)
History     CSN: [id number]  Arrival date & time 02/10/12  1707   First MD Initiated Contact with Patient 02/10/12 1817      Chief Complaint  Patient presents with  . Shortness of Breath   Level 5 caveat for SOB-unable to give full history  (Consider location/radiation/quality/duration/timing/severity/associated sxs/prior treatment) HPI  61yoF h/o COPD/Asthma, HTN since with shortness of breath. The patient states that she's been having shortness of breath for the past 2-3 days. Worse with ambulation. She's been taking her albuterol inhaler as well as her daily Symbicort and Spiriva without relief. States that the shortness of breath became acutely worsened today, specifically in the emergency department. This history was given prior to making the patient. When I evaluated the patient she is acutely short of breath and unable to speak in complete sentences.  Denies h/o VTE in self or family. No recent hosp/surg/immob. No h/o cancer. Denies exogenous hormone use, no leg pain or swelling    ED Notes, ED Provider Notes from 02/10/12 0000 to 02/10/12 17:25:29       Norina Buzzard, RN 02/10/2012 17:25      Pt states she started having sob since Sunday. Pt states she has become more sob when ambulating. Pt states she has not been able to sleep even after her breathing treatment. Pt states she is having a nagging cough that is causing her to have a headache.      Past Medical History  Diagnosis Date  . Hypertension   . COPD (chronic obstructive pulmonary disease)   . Asthma     Past Surgical History  Procedure Date  . Abdominal hysterectomy     Family History  Problem Relation Age of Onset  . Dementia Mother     History  Substance Use Topics  . Smoking status: Former Smoker -- 1.0 packs/day for 45 years    Types: Cigarettes    Quit date: 10/27/2011  . Smokeless tobacco: Former Neurosurgeon    Quit date: 10/21/2011  . Alcohol Use: No    OB History    Grav Para Term Preterm  Abortions TAB SAB Ect Mult Living                  Review of Systems  Unable to perform ROS: Other   except as noted HPI   Allergies  Clonidine derivatives; Codeine; Lisinopril; Penicillins; and Procaine  Home Medications   Current Outpatient Rx  Name Route Sig Dispense Refill  . ALBUTEROL SULFATE HFA 108 (90 BASE) MCG/ACT IN AERS Inhalation Inhale 2 puffs into the lungs every 6 (six) hours as needed. For shortness of breath      . ALBUTEROL SULFATE (2.5 MG/3ML) 0.083% IN NEBU Nebulization Take 2.5 mg by nebulization every 3 (three) hours as needed. For shortness of breath    . AMLODIPINE BESYLATE 10 MG PO TABS Oral Take 10 mg by mouth daily.      . BUDESONIDE-FORMOTEROL FUMARATE 80-4.5 MCG/ACT IN AERO Inhalation Inhale 2 puffs into the lungs 2 (two) times daily. 1 Inhaler 3  . HYDROCHLOROTHIAZIDE 25 MG PO TABS Oral Take 25 mg by mouth daily.     Marland Kitchen PSEUDOEPHEDRINE-GUAIFENESIN ER 60-600 MG PO TB12 Oral Take 1 tablet by mouth every 12 (twelve) hours as needed. For congestion.    Marland Kitchen TIOTROPIUM BROMIDE MONOHYDRATE 18 MCG IN CAPS Inhalation Place 18 mcg into inhaler and inhale daily. For shortness of breath       BP 114/74  Pulse 95  Temp 99.1 F (37.3 C)  Resp 20  Ht 5\' 5"  (1.651 m)  Wt 170 lb (77.111 kg)  BMI 28.29 kg/m2  SpO2 98%  Physical Exam  Nursing note and vitals reviewed. Constitutional: She is oriented to person, place, and time. She appears distressed.  HENT:  Head: Atraumatic.       Mm dry  Eyes: Conjunctivae and EOM are normal. Pupils are equal, round, and reactive to light.  Neck: Normal range of motion. Neck supple.  Cardiovascular: Normal rate, regular rhythm, normal heart sounds and intact distal pulses.   Pulmonary/Chest: She is in respiratory distress. She has wheezes. She has no rales.       Diffuse ins and exp wheeze Tachypnea Dec airmovement   Abdominal: Soft. She exhibits no distension. There is no tenderness. There is no rebound and no guarding.    Musculoskeletal: Normal range of motion.  Neurological: She is alert and oriented to person, place, and time.  Skin: Skin is warm and dry. No rash noted.  Psychiatric: She has a normal mood and affect.      Date: 02/10/2012  Rate: 91  Rhythm: normal sinus rhythm  QRS Axis: normal  Intervals: normal  ST/T Wave abnormalities: nonspecific T wave changes  Conduction Disutrbances:none  Narrative Interpretation:   Old EKG Reviewed: no significant change   ED Course  Procedures (including critical care time)  Labs Reviewed  BASIC METABOLIC PANEL - Abnormal; Notable for the following:    Glucose, Bld 100 (*)    GFR calc non Af Amer 59 (*)    GFR calc Af Amer 68 (*)    All other components within normal limits  CBC  DIFFERENTIAL  TROPONIN I  PRO B NATRIURETIC PEPTIDE  CULTURE, BLOOD (ROUTINE X 2)  CULTURE, BLOOD (ROUTINE X 2)   Dg Chest Port 1 View  02/10/2012  *RADIOLOGY REPORT*  Clinical Data: Shortness of breath, cough  PORTABLE CHEST - 1 VIEW  Comparison: 11/26/2011  Findings: Normal heart size.  Slight vascular and interstitial prominence without definite edema.  No pneumonia, collapse, consolidation, effusion or pneumothorax.  Trachea midline.  Mild thoracic scoliosis.  IMPRESSION: No acute chest process.  Stable exam.  Original Report Authenticated By: Judie Petit. Ruel Favors, M.D.     1. COPD exacerbation   2. Shortness of breath     MDM  She presents in acute respiratory distress. She is unable to tolerate a brief trial of BiPAP. She is somewhat improved after 2 continuous albuterol nebulizers, Solu-Medrol, Levaquin, magnesium. She continues to have some shortness of breath and tightness, wheezing. Her chest x-ray is unremarkable. Discussed admission with triad hospitalist, Dr. Geraldine Solar, MD 02/10/12 [phone]

## 2012-02-10 NOTE — ED Notes (Signed)
edmd notified pt is becoming sob

## 2012-02-10 NOTE — Progress Notes (Signed)
This note also relates to the following rows which could not be included:  SpO2 - Cannot attach notes to unvalidated device data

## 2012-02-10 NOTE — ED Notes (Signed)
Patient unable to tolerate orthostatic vital signs.

## 2012-02-10 NOTE — ED Notes (Signed)
Pt states she started having sob since Sunday. Pt states she has become more sob when ambulating. Pt states she has not been able to sleep even after her breathing treatment. Pt states she is having a nagging cough that is causing her to have a headache.

## 2012-02-11 LAB — CBC
HCT: 36.1 % (ref 36.0–46.0)
MCH: 29.4 pg (ref 26.0–34.0)
MCHC: 32.1 g/dL (ref 30.0–36.0)
MCV: 91.4 fL (ref 78.0–100.0)
RDW: 13.7 % (ref 11.5–15.5)
WBC: 10.1 10*3/uL (ref 4.0–10.5)

## 2012-02-11 LAB — BASIC METABOLIC PANEL
BUN: 19 mg/dL (ref 6–23)
CO2: 22 mEq/L (ref 19–32)
Chloride: 100 mEq/L (ref 96–112)
Creatinine, Ser: 1.16 mg/dL — ABNORMAL HIGH (ref 0.50–1.10)
Glucose, Bld: 158 mg/dL — ABNORMAL HIGH (ref 70–99)

## 2012-02-11 MED ORDER — LEVOFLOXACIN IN D5W 500 MG/100ML IV SOLN
500.0000 mg | INTRAVENOUS | Status: DC
  Filled 2012-02-11: qty 100

## 2012-02-11 MED ORDER — TIOTROPIUM BROMIDE MONOHYDRATE 18 MCG IN CAPS
18.0000 ug | ORAL_CAPSULE | Freq: Every day | RESPIRATORY_TRACT | Status: DC
  Administered 2012-02-11: 18 ug via RESPIRATORY_TRACT
  Filled 2012-02-11: qty 5

## 2012-02-11 MED ORDER — SODIUM CHLORIDE 0.9 % IJ SOLN
3.0000 mL | Freq: Two times a day (BID) | INTRAMUSCULAR | Status: DC
  Administered 2012-02-11 (×2): 3 mL via INTRAVENOUS

## 2012-02-11 MED ORDER — AMLODIPINE BESYLATE 10 MG PO TABS
10.0000 mg | ORAL_TABLET | Freq: Every day | ORAL | Status: DC
  Administered 2012-02-11 – 2012-02-12 (×2): 10 mg via ORAL
  Filled 2012-02-11 (×2): qty 1

## 2012-02-11 MED ORDER — LORAZEPAM 2 MG/ML IJ SOLN
1.0000 mg | INTRAMUSCULAR | Status: DC | PRN
  Administered 2012-02-12: 1 mg via INTRAVENOUS
  Filled 2012-02-11: qty 1

## 2012-02-11 MED ORDER — ALBUTEROL SULFATE (5 MG/ML) 0.5% IN NEBU: 2.5000 mg | INHALATION_SOLUTION | RESPIRATORY_TRACT | Status: DC | PRN

## 2012-02-11 MED ORDER — SODIUM CHLORIDE 0.9 % IV SOLN
INTRAVENOUS | Status: DC
  Administered 2012-02-11: 01:00:00 via INTRAVENOUS

## 2012-02-11 MED ORDER — ALBUTEROL SULFATE (5 MG/ML) 0.5% IN NEBU
2.5000 mg | INHALATION_SOLUTION | Freq: Four times a day (QID) | RESPIRATORY_TRACT | Status: DC
  Administered 2012-02-11 – 2012-02-12 (×6): 2.5 mg via RESPIRATORY_TRACT
  Filled 2012-02-11 (×6): qty 0.5

## 2012-02-11 MED ORDER — ONDANSETRON HCL 4 MG PO TABS: 4.0000 mg | ORAL_TABLET | Freq: Four times a day (QID) | ORAL | Status: DC | PRN

## 2012-02-11 MED ORDER — ONDANSETRON HCL 4 MG/2ML IJ SOLN: 4.0000 mg | Freq: Four times a day (QID) | INTRAMUSCULAR | Status: DC | PRN

## 2012-02-11 MED ORDER — ENOXAPARIN SODIUM 40 MG/0.4ML ~~LOC~~ SOLN
40.0000 mg | Freq: Every day | SUBCUTANEOUS | Status: DC
  Administered 2012-02-11 – 2012-02-12 (×2): 40 mg via SUBCUTANEOUS
  Filled 2012-02-11 (×2): qty 0.4

## 2012-02-11 MED ORDER — LORAZEPAM 2 MG/ML IJ SOLN
INTRAMUSCULAR | Status: AC
  Administered 2012-02-11: 1 mg
  Filled 2012-02-11: qty 1

## 2012-02-11 MED ORDER — METHYLPREDNISOLONE SODIUM SUCC 125 MG IJ SOLR
60.0000 mg | Freq: Four times a day (QID) | INTRAMUSCULAR | Status: DC
  Administered 2012-02-11 – 2012-02-12 (×4): 60 mg via INTRAVENOUS
  Filled 2012-02-11 (×7): qty 0.96

## 2012-02-11 MED ORDER — METHYLPREDNISOLONE SODIUM SUCC 125 MG IJ SOLR
60.0000 mg | INTRAMUSCULAR | Status: DC
  Administered 2012-02-11 (×3): 60 mg via INTRAVENOUS
  Filled 2012-02-11 (×8): qty 0.96

## 2012-02-11 MED ORDER — BUDESONIDE-FORMOTEROL FUMARATE 80-4.5 MCG/ACT IN AERO
2.0000 | INHALATION_SPRAY | Freq: Two times a day (BID) | RESPIRATORY_TRACT | Status: DC
  Administered 2012-02-11 – 2012-02-12 (×4): 2 via RESPIRATORY_TRACT
  Filled 2012-02-11: qty 6.9

## 2012-02-11 MED ORDER — GUAIFENESIN ER 600 MG PO TB12
600.0000 mg | ORAL_TABLET | Freq: Two times a day (BID) | ORAL | Status: DC
  Administered 2012-02-11 – 2012-02-12 (×2): 600 mg via ORAL
  Filled 2012-02-11 (×3): qty 1

## 2012-02-11 MED ORDER — IPRATROPIUM BROMIDE 0.02 % IN SOLN
0.5000 mg | Freq: Four times a day (QID) | RESPIRATORY_TRACT | Status: DC
  Administered 2012-02-11 – 2012-02-12 (×4): 0.5 mg via RESPIRATORY_TRACT
  Filled 2012-02-11 (×4): qty 2.5

## 2012-02-11 MED ORDER — LEVOFLOXACIN 500 MG PO TABS
500.0000 mg | ORAL_TABLET | Freq: Every day | ORAL | Status: DC
  Administered 2012-02-11: 500 mg via ORAL
  Filled 2012-02-11 (×2): qty 1

## 2012-02-11 NOTE — Progress Notes (Signed)
PROGRESS NOTE  Barbara [name] QIO:[id number] DOB: [birth year] DOA: 02/10/2012 PCP: Burtis Junes, MD, MD  Brief narrative: 61 year old woman presented with acute respiratory distress to the emergency department. Symptoms present for 2-3 days. Associated with wheezing and nonproductive cough. No fever. No chest pain. Was unable to tolerate BiPAP. Admitted for COPD exacerbation.  Past medical history: Hypertension, COPD, asthma and  Consultants:  None  Procedures:  None  Antibiotics:  March 5: Levaquin  Interim History: Chart reviewed in detail. No hypoxia. Discussed with RN: Anxiety and shortness of breath noted. Patient had an anxiety attack earlier today. Her mother is very sick and may be timing. However after receiving Ativan she was able to calm down and feels better.  Subjective: Feels better after Ativan. Complains of cough.  Objective: Filed Vitals:   02/11/12 0159 02/11/12 0438 02/11/12 0500 02/11/12 0818  BP:  116/65    Pulse:  97    Temp:  98.2 F (36.8 C)    TempSrc:  Oral    Resp:  18    Height:      Weight:   76.567 kg (168 lb 12.8 oz)   SpO2: 95% 94%  90%    Intake/Output Summary (Last 24 hours) at 02/11/12 1020 Last data filed at 02/11/12 0900  Gross per 24 hour  Intake    240 ml  Output    900 ml  Net   -660 ml    Exam:   General:  Appears more calm and comfortable. No acute distress.  Cardiovascular: Regular rate and rhythm. No murmur, rub, gallop.   Respiratory: Diffuse wheezes bilaterally. Fair air movement. Moderate increased work of breathing. Able to speak in full sentences.  Psychiatric: Mild anxiety, normal affect. Speech fluent and clear.  Data Reviewed: Basic Metabolic Panel:  Lab 02/11/12 3244 02/10/12 1824  NA 136 137  K 4.1 4.3  CL 100 100  CO2 22 28  GLUCOSE 158* 100*  BUN 19 15  CREATININE 1.16* 1.00  CALCIUM 9.4 9.6  MG -- --  PHOS -- --   CBC:  Lab 02/11/12 0512 02/10/12 1824  WBC 10.1 10.0  NEUTROABS  -- 7.0  HGB 11.6* 13.4  HCT 36.1 41.0  MCV 91.4 91.5  PLT 235 285   Cardiac Enzymes:  Lab 02/10/12 1824  CKTOTAL --  CKMB --  CKMBINDEX --  TROPONINI <0.30   Studies: Dg Chest Port 1 View  02/10/2012  *RADIOLOGY REPORT*  Clinical Data: Shortness of breath, cough  PORTABLE CHEST - 1 VIEW  Comparison: 11/26/2011  Findings: Normal heart size.  Slight vascular and interstitial prominence without definite edema.  No pneumonia, collapse, consolidation, effusion or pneumothorax.  Trachea midline.  Mild thoracic scoliosis.  IMPRESSION: No acute chest process.  Stable exam.  Original Report Authenticated By: Judie Petit. TREVOR Miles Costain, M.D.    Scheduled Meds:   . albuterol  2.5 mg Nebulization Q6H  . albuterol  10 mg/hr Nebulization Once  . amLODipine  10 mg Oral Daily  . budesonide-formoterol  2 puff Inhalation BID  . enoxaparin  40 mg Subcutaneous QAC breakfast  . ipratropium  0.5 mg Nebulization Once  . levofloxacin (LEVAQUIN) IV  500 mg Intravenous Q24H  . LORazepam      . magnesium sulfate 1 - 4 g bolus IVPB  2 g Intravenous Once  . methylPREDNISolone (SOLU-MEDROL) injection  125 mg Intravenous Once  . methylPREDNISolone (SOLU-MEDROL) injection  60 mg Intravenous Q4H  . sodium chloride  500 mL Intravenous Once  .  sodium chloride  3 mL Intravenous Q12H  . tiotropium  18 mcg Inhalation Daily  . DISCONTD: levofloxacin (LEVAQUIN) IV  750 mg Intravenous Q24H   Continuous Infusions:   . sodium chloride    . sodium chloride 100 mL/hr at 02/11/12 0109  . albuterol 10 mg/hr (02/10/12 2152)  . albuterol 10 mg/hr (02/10/12 2316)     Assessment/Plan: 1. COPD exacerbation: Continue oxygen, nebulizer therapy, steroids, Levaquin. Resume Spiriva when improved. 2. Hypertension: Appears stable. Continue Norvasc. 3. Situational anxiety: Ativan as needed. 4. History of cigarette smoking: Patient quit approximately 4 months ago.  Discussed with patient bedside. All questions answered to her apparent  satisfaction.  Code Status: Full code Family Communication: None at bedside Disposition Plan: Home when improved.   Brendia Sacks, MD  [facility] Pager [phone] 02/11/2012, 10:20 AM    LOS: 1 day

## 2012-02-11 NOTE — Progress Notes (Signed)
UR CHART REVIEWED; B Ralph Benavidez RN, BSN, MHA 

## 2012-02-11 NOTE — H&P (Signed)
PCP:   Burtis Junes, MD, MD   Chief Complaint: Progressive shortness of breath   HPI: Barbara [name] is an 61 y.o. female with history of asthma, COPD, hypertension, presents to [facility] long emergency room with 2-3 days history of progressive dyspnea on exertion, wheezing, and nonproductive cough. She denied any fever, chills, nausea, vomiting, or any chest pain. She has been very compliant with her medications in the past. She called the office but was directed to the emergency room. Evaluation in emergency room included a chest x-ray which showed no infiltrate, normal white count of 10,000, unremarkable BNP and negative cardiac marker.Marland Kitchen Hospitalist was asked to admit her for COPD exacerbation after several nebulizer treatments given.  She was also given magnesium IV along with steroid bolus.  Rewiew of Systems:  The patient denies anorexia, fever, weight loss,, vision loss, decreased hearing, hoarseness, chest pain, syncope,  peripheral edema, balance deficits, hemoptysis, abdominal pain, melena, hematochezia, severe indigestion/heartburn, hematuria, incontinence, genital sores, muscle weakness, suspicious skin lesions, transient blindness, difficulty walking, depression, unusual weight change, abnormal bleeding, enlarged lymph nodes, angioedema, and breast masses.    Past Medical History  Diagnosis Date  . Hypertension   . COPD (chronic obstructive pulmonary disease)   . Asthma     Past Surgical History  Procedure Date  . Abdominal hysterectomy     Medications:  HOME MEDS: Prior to Admission medications   Medication Sig Start Date End Date Taking? Authorizing Provider  albuterol (PROVENTIL HFA;VENTOLIN HFA) 108 (90 BASE) MCG/ACT inhaler Inhale 2 puffs into the lungs every 6 (six) hours as needed. For shortness of breath     Yes Historical Provider, MD  albuterol (PROVENTIL) (2.5 MG/3ML) 0.083% nebulizer solution Take 2.5 mg by nebulization every 3 (three) hours as needed. For  shortness of breath   Yes Historical Provider, MD  amLODipine (NORVASC) 10 MG tablet Take 10 mg by mouth daily.     Yes Historical Provider, MD  budesonide-formoterol (SYMBICORT) 80-4.5 MCG/ACT inhaler Inhale 2 puffs into the lungs 2 (two) times daily. 10/23/11 10/22/12 Yes Ripudeep Jenna Luo, MD  hydrochlorothiazide (HYDRODIURIL) 25 MG tablet Take 25 mg by mouth daily.    Yes Historical Provider, MD  pseudoephedrine-guaifenesin (MUCINEX D) 60-600 MG per tablet Take 1 tablet by mouth every 12 (twelve) hours as needed. For congestion.   Yes Historical Provider, MD  tiotropium (SPIRIVA) 18 MCG inhalation capsule Place 18 mcg into inhaler and inhale daily. For shortness of breath    Yes Historical Provider, MD     Allergies:  Allergies  Allergen Reactions  . Clonidine Derivatives     Coughing shortness of breath  . Codeine Other (See Comments)    Reaction: hallucination   . Lisinopril   . Penicillins Hives  . Procaine Rash    Social History:   reports that she quit smoking about 3 months ago. Her smoking use included Cigarettes. She has a 45 pack-year smoking history. She quit smokeless tobacco use about 3 months ago. She reports that she does not drink alcohol or use illicit drugs.  Family History: Family History  Problem Relation Age of Onset  . Dementia Mother      Physical Exam: Filed Vitals:   02/10/12 2316 02/11/12 0001 02/11/12 0003 02/11/12 0033  BP:  133/71  121/71  Pulse:    97  Temp:   98.3 F (36.8 C) 98 F (36.7 C)  TempSrc:   Oral Oral  Resp:    20  Height:    5'  5" (1.651 m)  Weight:    77 kg (169 lb 12.1 oz)  SpO2: 97%   98%   Blood pressure 121/71, pulse 97, temperature 98 F (36.7 C), temperature source Oral, resp. rate 20, height 5\' 5"  (1.651 m), weight 77 kg (169 lb 12.1 oz), SpO2 98.00%.  GEN:  Pleasant  person lying in the stretcher in no acute distress; cooperative with exam PSYCH:  alert and oriented x4; does not appear anxious does not appear  depressed; affect is normal HEENT: Mucous membranes pink and anicteric; PERRLA; EOM intact; no cervical lymphadenopathy nor thyromegaly or carotid bruit; no JVD; Breasts:: Not examined CHEST WALL: No tenderness CHEST: She has both inspiratory and expiratory wheezes but no crackles HEART: Regular rate and rhythm; no murmurs rubs or gallops BACK: No kyphosis or scoliosis; no CVA tenderness ABDOMEN: Obese, soft non-tender; no masses, no organomegaly, normal abdominal bowel sounds; no pannus; no intertriginous candida. Rectal Exam: Not done EXTREMITIES: No bone or joint deformity; age-appropriate arthropathy of the hands and knees; no edema; no ulcerations. Genitalia: not examined PULSES: 2+ and symmetric SKIN: Normal hydration no rash or ulceration CNS: Cranial nerves 2-12 grossly intact no focal neurologic deficit   Labs & Imaging Results for orders placed during the hospital encounter of 02/10/12 (from the past 48 hour(s))  CBC     Status: Normal   Collection Time   02/10/12  6:24 PM      Component Value Range Comment   WBC 10.0  4.0 - 10.5 (K/uL)    RBC 4.48  3.87 - 5.11 (MIL/uL)    Hemoglobin 13.4  12.0 - 15.0 (g/dL)    HCT 96.0  45.4 - 09.8 (%)    MCV 91.5  78.0 - 100.0 (fL)    MCH 29.9  26.0 - 34.0 (pg)    MCHC 32.7  30.0 - 36.0 (g/dL)    RDW 11.9  14.7 - 82.9 (%)    Platelets 285  150 - 400 (K/uL)   DIFFERENTIAL     Status: Normal   Collection Time   02/10/12  6:24 PM      Component Value Range Comment   Neutrophils Relative 71  43 - 77 (%)    Neutro Abs 7.0  1.7 - 7.7 (K/uL)    Lymphocytes Relative 18  12 - 46 (%)    Lymphs Abs 1.8  0.7 - 4.0 (K/uL)    Monocytes Relative 7  3 - 12 (%)    Monocytes Absolute 0.7  0.1 - 1.0 (K/uL)    Eosinophils Relative 4  0 - 5 (%)    Eosinophils Absolute 0.4  0.0 - 0.7 (K/uL)    Basophils Relative 0  0 - 1 (%)    Basophils Absolute 0.0  0.0 - 0.1 (K/uL)   BASIC METABOLIC PANEL     Status: Abnormal   Collection Time   02/10/12  6:24 PM       Component Value Range Comment   Sodium 137  135 - 145 (mEq/L)    Potassium 4.3  3.5 - 5.1 (mEq/L)    Chloride 100  96 - 112 (mEq/L)    CO2 28  19 - 32 (mEq/L)    Glucose, Bld 100 (*) 70 - 99 (mg/dL)    BUN 15  6 - 23 (mg/dL)    Creatinine, Ser 5.62  0.50 - 1.10 (mg/dL)    Calcium 9.6  8.4 - 10.5 (mg/dL)    GFR calc non Af Amer 59 (*) >90 (  mL/min)    GFR calc Af Amer 68 (*) >90 (mL/min)   TROPONIN I     Status: Normal   Collection Time   02/10/12  6:24 PM      Component Value Range Comment   Troponin I <0.30  <0.30 (ng/mL)   PRO B NATRIURETIC PEPTIDE     Status: Normal   Collection Time   02/10/12  6:24 PM      Component Value Range Comment   Pro B Natriuretic peptide (BNP) 57.5  0 - 125 (pg/mL)    Dg Chest Port 1 View  02/10/2012  *RADIOLOGY REPORT*  Clinical Data: Shortness of breath, cough  PORTABLE CHEST - 1 VIEW  Comparison: 11/26/2011  Findings: Normal heart size.  Slight vascular and interstitial prominence without definite edema.  No pneumonia, collapse, consolidation, effusion or pneumothorax.  Trachea midline.  Mild thoracic scoliosis.  IMPRESSION: No acute chest process.  Stable exam.  Original Report Authenticated By: Judie Petit. Ruel Favors, M.D.      Assessment Present on Admission:  .Hypertension .COPD with acute exacerbation .Nicotine abuse   PLAN: She will receive intravenous Solu-Medrol along with frequent nebulizer treatments. She was given Levaquin in the emergency room and this will be continued. I will continue her home medications. She has quit smoking. 4 months and I congratulated her and encouraged her to continue to abstain from cigarette use. She is stable, full code, and will be [facility].   Other plans as per orders.    Rider Ermis 02/11/2012, 1:39 AM

## 2012-02-12 MED ORDER — LEVOFLOXACIN 500 MG PO TABS: 500.0000 mg | ORAL_TABLET | Freq: Every day | ORAL | Status: AC

## 2012-02-12 MED ORDER — LORAZEPAM 0.5 MG PO TABS: 0.5000 mg | ORAL_TABLET | Freq: Four times a day (QID) | ORAL | Status: DC | PRN

## 2012-02-12 MED ORDER — GUAIFENESIN ER 600 MG PO TB12: 600.0000 mg | ORAL_TABLET | Freq: Two times a day (BID) | ORAL | Status: DC

## 2012-02-12 MED ORDER — PREDNISONE 10 MG PO TABS: ORAL_TABLET | ORAL | Status: DC

## 2012-02-12 MED ORDER — PREDNISONE 20 MG PO TABS: 40.0000 mg | ORAL_TABLET | Freq: Every day | ORAL | Status: DC

## 2012-02-12 MED ORDER — LORAZEPAM 0.5 MG PO TABS: 0.5000 mg | ORAL_TABLET | Freq: Three times a day (TID) | ORAL | Status: AC | PRN

## 2012-02-12 NOTE — Discharge Summary (Signed)
Physician Discharge Summary  Eilah Common UEA:[id number] DOB: Apr 13, 1951 DOA: 02/10/2012  PCP: Burtis Junes, MD, MD  Admit date: 02/10/2012 Discharge date: 02/12/2012  Discharge Diagnoses:  1. COPD exacerbation 2. Hypertension, stable 3. Situational anxiety  Discharge Condition: Improved  Disposition: Home  History of present illness:  61 year old woman presented with acute respiratory distress to the emergency department. Symptoms present for 2-3 days. Associated with wheezing and nonproductive cough. No fever. No chest pain. Admitted for COPD exacerbation.  Hospital Course:  Ms. Iturralde was admitted to the medical floor and treated for her COPD exacerbation. Her symptoms quickly improved and she is now stable for discharge. She completed a steroid taper and antibiotics in the outpatient setting. I have suggested outpatient pulmonology referral which she will consider. She has had some situational anxiety regarding the illness of her mother. Her mother passed away today. This appears to be stable for discharge. These and other issues as outlined below. 1. COPD exacerbation: Improving. Continue nebulizer therapy, steroids, Levaquin. Resume Spiriva. Recommended outpatient pulmonology evaluation. Patient will consider.  2. Hypertension: Appears stable. Continue Norvasc.  3. Situational anxiety: Ativan as needed.  4. History of cigarette smoking: Patient quit approximately 4 months ago.  Consultants:  None  Procedures:  None  Antibiotics:  March 5: Levaquin  Discharge Instructions  Discharge Orders    Future Orders Please Complete By Expires   Diet - low sodium heart healthy      Increase activity slowly        Medication List  As of 02/12/2012  1:04 PM   STOP taking these medications         pseudoephedrine-guaifenesin 60-600 MG per tablet         TAKE these medications         albuterol 108 (90 BASE) MCG/ACT inhaler   Commonly known as: PROVENTIL HFA;VENTOLIN HFA     Inhale 2 puffs into the lungs every 6 (six) hours as needed. For shortness of breath        albuterol (2.5 MG/3ML) 0.083% nebulizer solution   Commonly known as: PROVENTIL   Take 2.5 mg by nebulization every 3 (three) hours as needed. For shortness of breath      amLODipine 10 MG tablet   Commonly known as: NORVASC   Take 10 mg by mouth daily.      budesonide-formoterol 80-4.5 MCG/ACT inhaler   Commonly known as: SYMBICORT   Inhale 2 puffs into the lungs 2 (two) times daily.      guaiFENesin 600 MG 12 hr tablet   Commonly known as: MUCINEX   Take 1 tablet (600 mg total) by mouth 2 (two) times daily.      hydrochlorothiazide 25 MG tablet   Commonly known as: HYDRODIURIL   Take 25 mg by mouth daily.      levofloxacin 500 MG tablet   Commonly known as: LEVAQUIN   Take 1 tablet (500 mg total) by mouth daily. First dose 3/8.      LORazepam 0.5 MG tablet   Commonly known as: ATIVAN   Take 1 tablet (0.5 mg total) by mouth every 8 (eight) hours as needed for anxiety.      predniSONE 10 MG tablet   Commonly known as: DELTASONE   Start March 8. 40 mg by mouth daily x3 days, then 20 mg by mouth daily x3 days, then 10 mg by mouth daily x3 days, then stop.      tiotropium 18 MCG inhalation capsule   Commonly known  as: SPIRIVA   Place 18 mcg into inhaler and inhale daily. For shortness of breath           Follow-up Information    Follow up with Burtis Junes, MD. Schedule an appointment as soon as possible for a visit in 1 week.          The results of significant diagnostics from this hospitalization (including imaging, microbiology, ancillary and laboratory) are listed below for reference.    Significant Diagnostic Studies: Dg Chest Port 1 View  02/10/2012  *RADIOLOGY REPORT*  Clinical Data: Shortness of breath, cough  PORTABLE CHEST - 1 VIEW  Comparison: 11/26/2011  Findings: Normal heart size.  Slight vascular and interstitial prominence without definite edema.   No pneumonia, collapse, consolidation, effusion or pneumothorax.  Trachea midline.  Mild thoracic scoliosis.  IMPRESSION: No acute chest process.  Stable exam.  Original Report Authenticated By: Judie Petit. Ruel Favors, M.D.    Microbiology: Recent Results (from the past 240 hour(s))  CULTURE, BLOOD (ROUTINE X 2)     Status: Normal (Preliminary result)   Collection Time   02/10/12  6:45 PM      Component Value Range Status Comment   Specimen Description BLOOD RIGHT ARM   Final    Special Requests BOTTLES DRAWN AEROBIC AND ANAEROBIC 5CC   Final    Culture  Setup Time [id number]   Final    Culture     Final    Value:        BLOOD CULTURE RECEIVED NO GROWTH TO DATE CULTURE WILL BE HELD FOR 5 DAYS BEFORE ISSUING A FINAL NEGATIVE REPORT   Report Status PENDING   Incomplete   CULTURE, BLOOD (ROUTINE X 2)     Status: Normal (Preliminary result)   Collection Time   02/10/12  6:50 PM      Component Value Range Status Comment   Specimen Description BLOOD RIGHT ARM   Final    Special Requests BOTTLES DRAWN AEROBIC AND ANAEROBIC 5CC   Final    Culture  Setup Time [id number]   Final    Culture     Final    Value:        BLOOD CULTURE RECEIVED NO GROWTH TO DATE CULTURE WILL BE HELD FOR 5 DAYS BEFORE ISSUING A FINAL NEGATIVE REPORT   Report Status PENDING   Incomplete      Labs: Basic Metabolic Panel:  Lab 02/11/12 2130 02/10/12 1824  NA 136 137  K 4.1 4.3  CL 100 100  CO2 22 28  GLUCOSE 158* 100*  BUN 19 15  CREATININE 1.16* 1.00  CALCIUM 9.4 9.6  MG -- --  PHOS -- --   CBC:  Lab 02/11/12 0512 02/10/12 1824  WBC 10.1 10.0  NEUTROABS -- 7.0  HGB 11.6* 13.4  HCT 36.1 41.0  MCV 91.4 91.5  PLT 235 285   Cardiac Enzymes:  Lab 02/10/12 1824  CKTOTAL --  CKMB --  CKMBINDEX --  TROPONINI <0.30    Time coordinating discharge: 35 minutes.  Signed:  Brendia Sacks, MD  [facility] 02/12/2012, 1:04 PM

## 2012-02-12 NOTE — Progress Notes (Signed)
PROGRESS NOTE  Barbara [name] ZOX:[id number] DOB: 10-13-1951 DOA: 02/10/2012 PCP: Burtis Junes, MD, MD  Brief narrative: 61 year old woman presented with acute respiratory distress to the emergency department. Symptoms present for 2-3 days. Associated with wheezing and nonproductive cough. No fever. No chest pain. Was unable to tolerate BiPAP. Admitted for COPD exacerbation.  Past medical history: Hypertension, COPD, asthma and  Consultants:  None  Procedures:  None  Antibiotics:  March 5: Levaquin  Interim History: Interval documentation reviewed. Vital signs stable. Blood cultures no growth to date.  Subjective: Feels better. Ready to go home.  Objective: Filed Vitals:   02/12/12 0228 02/12/12 0642 02/12/12 0700 02/12/12 1028  BP:  136/82  112/71  Pulse:  80    Temp:  97.7 F (36.5 C)    TempSrc:  Oral    Resp:  17    Height:      Weight:      SpO2: 91% 97% 89%     Intake/Output Summary (Last 24 hours) at 02/12/12 1114 Last data filed at 02/12/12 0851  Gross per 24 hour  Intake    603 ml  Output   1200 ml  Net   -597 ml    Exam:   General:  Appears more calm and comfortable. No acute distress.  Cardiovascular: Regular rate and rhythm. No murmur, rub, gallop.   Respiratory: Few wheezes bilaterally. Improved air movement. Minimal increased work of breathing. Able to speak in full sentences.  Psychiatric: Mild anxiety, normal affect. Speech fluent and clear.  Data Reviewed: Basic Metabolic Panel:  Lab 02/11/12 8119 02/10/12 1824  NA 136 137  K 4.1 4.3  CL 100 100  CO2 22 28  GLUCOSE 158* 100*  BUN 19 15  CREATININE 1.16* 1.00  CALCIUM 9.4 9.6  MG -- --  PHOS -- --   CBC:  Lab 02/11/12 0512 02/10/12 1824  WBC 10.1 10.0  NEUTROABS -- 7.0  HGB 11.6* 13.4  HCT 36.1 41.0  MCV 91.4 91.5  PLT 235 285   Cardiac Enzymes:  Lab 02/10/12 1824  CKTOTAL --  CKMB --  CKMBINDEX --  TROPONINI <0.30   Studies: Dg Chest Port 1  View  02/10/2012  *RADIOLOGY REPORT*  Clinical Data: Shortness of breath, cough  PORTABLE CHEST - 1 VIEW  Comparison: 11/26/2011  Findings: Normal heart size.  Slight vascular and interstitial prominence without definite edema.  No pneumonia, collapse, consolidation, effusion or pneumothorax.  Trachea midline.  Mild thoracic scoliosis.  IMPRESSION: No acute chest process.  Stable exam.  Original Report Authenticated By: Judie Petit. TREVOR Miles Costain, M.D.   Scheduled Meds:    . albuterol  2.5 mg Nebulization Q6H  . amLODipine  10 mg Oral Daily  . budesonide-formoterol  2 puff Inhalation BID  . enoxaparin  40 mg Subcutaneous QAC breakfast  . guaiFENesin  600 mg Oral BID  . ipratropium  0.5 mg Nebulization Q6H  . levofloxacin  500 mg Oral Daily  . methylPREDNISolone (SOLU-MEDROL) injection  60 mg Intravenous Q6H  . sodium chloride  3 mL Intravenous Q12H   Continuous Infusions:    Assessment/Plan: 1. COPD exacerbation: Improving. Continue oxygen, nebulizer therapy, steroids, Levaquin. Resume Spiriva. Recommended outpatient pulmonology evaluation. Patient will consider. 2. Hypertension: Appears stable. Continue Norvasc. 3. Situational anxiety: Ativan as needed. 4. History of cigarette smoking: Patient quit approximately 4 months ago.   Code Status: Full code Family Communication: None at bedside Disposition Plan: Home today.   Brendia Sacks, MD  [facility] Pager  [phone] 02/12/2012, 11:14 AM    LOS: 2 days

## 2012-02-17 LAB — CULTURE, BLOOD (ROUTINE X 2)
Culture  Setup Time: 201303060246
Culture: NO GROWTH

## 2012-03-10 ENCOUNTER — Encounter (HOSPITAL_COMMUNITY): Payer: Self-pay | Admitting: Emergency Medicine

## 2012-03-10 ENCOUNTER — Inpatient Hospital Stay (HOSPITAL_COMMUNITY)
Admission: EM | Admit: 2012-03-10 | Discharge: 2012-03-12 | DRG: 192 | Disposition: A | Discharge status: Home / Self Care | Payer: Self-pay | Attending: Internal Medicine | Admitting: Internal Medicine

## 2012-03-10 ENCOUNTER — Other Ambulatory Visit: Payer: Self-pay

## 2012-03-10 ENCOUNTER — Emergency Department (HOSPITAL_COMMUNITY): Payer: Self-pay

## 2012-03-10 DIAGNOSIS — J449 Chronic obstructive pulmonary disease, unspecified: Secondary | ICD-10-CM

## 2012-03-10 DIAGNOSIS — I10 Essential (primary) hypertension: Secondary | ICD-10-CM | POA: Diagnosis present

## 2012-03-10 DIAGNOSIS — J441 Chronic obstructive pulmonary disease with (acute) exacerbation: Principal | ICD-10-CM | POA: Diagnosis present

## 2012-03-10 DIAGNOSIS — Z87891 Personal history of nicotine dependence: Secondary | ICD-10-CM

## 2012-03-10 DIAGNOSIS — J45901 Unspecified asthma with (acute) exacerbation: Principal | ICD-10-CM | POA: Diagnosis present

## 2012-03-10 HISTORY — DX: Unspecified osteoarthritis, unspecified site: M19.90

## 2012-03-10 HISTORY — DX: Reserved for concepts with insufficient information to code with codable children: IMO0002

## 2012-03-10 LAB — CBC
Hemoglobin: 13.1 g/dL (ref 12.0–15.0)
MCH: 30.7 pg (ref 26.0–34.0)
MCHC: 32.4 g/dL (ref 30.0–36.0)
RBC: 4.27 MIL/uL (ref 3.87–5.11)
RDW: 14.4 % (ref 11.5–15.5)

## 2012-03-10 LAB — DIFFERENTIAL
Basophils Absolute: 0 10*3/uL (ref 0.0–0.1)
Lymphocytes Relative: 25 % (ref 12–46)
Lymphs Abs: 1.9 10*3/uL (ref 0.7–4.0)
Monocytes Absolute: 0.6 10*3/uL (ref 0.1–1.0)
Monocytes Relative: 8 % (ref 3–12)
Neutro Abs: 4.8 10*3/uL (ref 1.7–7.7)

## 2012-03-10 LAB — COMPREHENSIVE METABOLIC PANEL
AST: 43 U/L — ABNORMAL HIGH (ref 0–37)
CO2: 23 mEq/L (ref 19–32)
Chloride: 99 mEq/L (ref 96–112)
Creatinine, Ser: 0.88 mg/dL (ref 0.50–1.10)
GFR calc non Af Amer: 68 mL/min — ABNORMAL LOW (ref >90)
Glucose, Bld: 110 mg/dL — ABNORMAL HIGH (ref 70–99)
Total Bilirubin: 0.3 mg/dL (ref 0.3–1.2)

## 2012-03-10 LAB — BLOOD GAS, ARTERIAL
Bicarbonate: 25.7 mEq/L — ABNORMAL HIGH (ref 20.0–24.0)
O2 Saturation: 92 %
Patient temperature: 98.6
TCO2: 23 mmol/L (ref 0–100)

## 2012-03-10 LAB — PRO B NATRIURETIC PEPTIDE: Pro B Natriuretic peptide (BNP): 99.8 pg/mL (ref 0–125)

## 2012-03-10 MED ORDER — ALBUTEROL SULFATE (5 MG/ML) 0.5% IN NEBU: 5.0000 mg | INHALATION_SOLUTION | RESPIRATORY_TRACT | Status: DC

## 2012-03-10 MED ORDER — AMLODIPINE BESYLATE 10 MG PO TABS
10.0000 mg | ORAL_TABLET | Freq: Every day | ORAL | Status: DC
  Administered 2012-03-11 – 2012-03-12 (×2): 10 mg via ORAL
  Filled 2012-03-10 (×3): qty 1

## 2012-03-10 MED ORDER — ALBUTEROL SULFATE (5 MG/ML) 0.5% IN NEBU: 2.5000 mg | INHALATION_SOLUTION | RESPIRATORY_TRACT | Status: DC | PRN

## 2012-03-10 MED ORDER — IPRATROPIUM BROMIDE 0.02 % IN SOLN
RESPIRATORY_TRACT | Status: AC
  Administered 2012-03-10: 11:00:00
  Filled 2012-03-10: qty 2.5

## 2012-03-10 MED ORDER — ENOXAPARIN SODIUM 40 MG/0.4ML ~~LOC~~ SOLN
40.0000 mg | SUBCUTANEOUS | Status: DC
  Administered 2012-03-10 – 2012-03-12 (×2): 40 mg via SUBCUTANEOUS
  Filled 2012-03-10 (×4): qty 0.4

## 2012-03-10 MED ORDER — SODIUM CHLORIDE 0.9 % IV SOLN: 250.0000 mL | INTRAVENOUS | Status: DC | PRN

## 2012-03-10 MED ORDER — SODIUM CHLORIDE 0.9 % IJ SOLN
3.0000 mL | Freq: Two times a day (BID) | INTRAMUSCULAR | Status: DC
  Administered 2012-03-10 – 2012-03-11 (×2): 3 mL via INTRAVENOUS

## 2012-03-10 MED ORDER — BUDESONIDE-FORMOTEROL FUMARATE 80-4.5 MCG/ACT IN AERO
2.0000 | INHALATION_SPRAY | Freq: Two times a day (BID) | RESPIRATORY_TRACT | Status: DC
  Administered 2012-03-10 – 2012-03-11 (×2): 2 via RESPIRATORY_TRACT
  Filled 2012-03-10: qty 6.9

## 2012-03-10 MED ORDER — METHYLPREDNISOLONE SODIUM SUCC 125 MG IJ SOLR
INTRAMUSCULAR | Status: AC
  Administered 2012-03-10: 11:00:00
  Filled 2012-03-10: qty 2

## 2012-03-10 MED ORDER — ALBUTEROL SULFATE (5 MG/ML) 0.5% IN NEBU
INHALATION_SOLUTION | RESPIRATORY_TRACT | Status: AC
  Filled 2012-03-10: qty 2

## 2012-03-10 MED ORDER — ALBUTEROL SULFATE (5 MG/ML) 0.5% IN NEBU
5.0000 mg | INHALATION_SOLUTION | Freq: Once | RESPIRATORY_TRACT | Status: AC
  Administered 2012-03-10: 11:00:00 via RESPIRATORY_TRACT
  Filled 2012-03-10: qty 1

## 2012-03-10 MED ORDER — ALBUTEROL SULFATE (5 MG/ML) 0.5% IN NEBU
2.5000 mg | INHALATION_SOLUTION | Freq: Four times a day (QID) | RESPIRATORY_TRACT | Status: DC
  Administered 2012-03-10 – 2012-03-11 (×4): 2.5 mg via RESPIRATORY_TRACT
  Filled 2012-03-10 (×4): qty 0.5

## 2012-03-10 MED ORDER — TIOTROPIUM BROMIDE MONOHYDRATE 18 MCG IN CAPS
18.0000 ug | ORAL_CAPSULE | Freq: Every day | RESPIRATORY_TRACT | Status: DC
  Administered 2012-03-11: 18 ug via RESPIRATORY_TRACT
  Filled 2012-03-10: qty 5

## 2012-03-10 MED ORDER — LEVOFLOXACIN IN D5W 500 MG/100ML IV SOLN
500.0000 mg | INTRAVENOUS | Status: DC
  Administered 2012-03-10 – 2012-03-12 (×2): 500 mg via INTRAVENOUS
  Filled 2012-03-10 (×3): qty 100

## 2012-03-10 MED ORDER — HYDROCHLOROTHIAZIDE 25 MG PO TABS
25.0000 mg | ORAL_TABLET | Freq: Every day | ORAL | Status: DC
  Administered 2012-03-11 – 2012-03-12 (×2): 25 mg via ORAL
  Filled 2012-03-10 (×3): qty 1

## 2012-03-10 MED ORDER — SODIUM CHLORIDE 0.9 % IJ SOLN: 3.0000 mL | INTRAMUSCULAR | Status: DC | PRN

## 2012-03-10 NOTE — ED Notes (Signed)
Hx of COPD, respiratory difficulty started last pm, used personal neb x 4 w/o any relief. Called EMS this am. 2 breathing treatments PTA, #20 saline lock left forearm, 125 mg Solumedrol PTA also.

## 2012-03-10 NOTE — H&P (Signed)
Hospital Admission Note Date: 03/10/2012  Patient name: Barbara [name] Medical record number: [id number] Date of birth: 1951/04/30 Age: 61 y.o. Gender: female PCP: Burtis Junes, MD, MD  Attending physician: Altha Harm, MD  Chief Complaint: Dyspnea on exertion and shortness of breath  History of Present Illness: Patient is a 61 year old female with a history of COPD who was recently discharged from the hospital less than a month ago. The patient states that she had a course of steroids and almost immediately after completing the steroids started having difficulty with breathing and a nonproductive cough. She states that this time and won't she's been having progressive dyspnea with exertion. Patient has been using her usual respiratory medications without much relief. She states that last night she had some PND and had to sleep in a sitting up position. She denies pedal edema or any change in urinary frequency or volume. The patient denies ever having had PFTs in the past. She has never been under care and pulmonologist. She denies any fever or chills, any nausea vomiting or diarrhea, any dizziness loss of consciousness.  Scheduled Meds:   . albuterol  5 mg Nebulization Once  . albuterol  5 mg Nebulization Q4H  . ipratropium      . methylPREDNISolone sodium succinate       Continuous Infusions:  PRN Meds:. Allergies: Clonidine derivatives; Codeine; Lisinopril; Nsaids; Penicillins; Vicodin; and Procaine Past Medical History  Diagnosis Date  . Hypertension   . COPD (chronic obstructive pulmonary disease)   . Asthma   . Arthritis     right knee, neck  . Mouth trouble     infection from tooth that was pulled approx. mid Mar. 2013   Past Surgical History  Procedure Date  . Abdominal hysterectomy   . Tonsillectomy age 66  . Tubal ligation   . Shoulder surgery in age 57's    cyst removed from Bursa socket of left shoulder   Family History  Problem Relation Age of Onset   . Dementia Mother    History   Social History  . Marital Status: Divorced    Spouse Name: N/A    Number of Children: N/A  . Years of Education: N/A   Occupational History  . Not on file.   Social History Main Topics  . Smoking status: Former Smoker -- 1.0 packs/day for 45 years    Types: Cigarettes    Quit date: 10/27/2011  . Smokeless tobacco: Never Used  . Alcohol Use: No  . Drug Use: No  . Sexually Active: No   Other Topics Concern  . Not on file   Social History Narrative  . No narrative on file   Review of Systems: A comprehensive review of systems was negative. Physical Exam: No intake or output data in the 24 hours ending 03/10/12 1709 General: Alert, awake, oriented x3, in no acute distress.  HEENT: [place]/AT PEERL, EOMI Neck: Trachea midline,  no masses, no thyromegal,y no JVD, no carotid bruit OROPHARYNX:  Moist, No exudate/ erythema/lesions.  Heart: Regular rate and rhythm, without murmurs, rubs, gallops, PMI non-displaced, no heaves or thrills on palpation.  Lungs: Clear to auscultation, no wheezing or rhonchi noted. No increased vocal fremitus resonant to percussion  Abdomen: Soft, nontender, nondistended, positive bowel sounds, no masses no hepatosplenomegaly noted..  Neuro: No focal neurological deficits noted cranial nerves II through XII grossly intact. DTRs 2+ bilaterally upper and lower extremities. Strength 5 out of 5 in bilateral upper and lower extremities. Musculoskeletal: No warm  swelling or erythema around joints, no spinal tenderness noted. Psychiatric: Patient alert and oriented x3, good insight and cognition, good recent to remote recall. Lymph node survey: No cervical axillary or inguinal lymphadenopathy noted.  Lab results:  [facility] 03/10/12 1115  NA 135  K 4.3  CL 99  CO2 23  GLUCOSE 110*  BUN 12  CREATININE 0.88  CALCIUM 9.3  MG --  PHOS --    Basename 03/10/12 1115  AST 43*  ALT 29  ALKPHOS 78  BILITOT 0.3  PROT 7.8    ALBUMIN 4.0   No results found for this basename: LIPASE:2,AMYLASE:2 in the last 72 hours  Basename 03/10/12 1115  WBC 7.5  NEUTROABS 4.8  HGB 13.1  HCT 40.4  MCV 94.6  PLT 240    Basename 03/10/12 1115  CKTOTAL --  CKMB --  CKMBINDEX --  TROPONINI <0.30   No components found with this basename: POCBNP:3 No results found for this basename: DDIMER:2 in the last 72 hours No results found for this basename: HGBA1C:2 in the last 72 hours No results found for this basename: CHOL:2,HDL:2,LDLCALC:2,TRIG:2,CHOLHDL:2,LDLDIRECT:2 in the last 72 hours No results found for this basename: TSH,T4TOTAL,FREET3,T3FREE,THYROIDAB in the last 72 hours No results found for this basename: VITAMINB12:2,FOLATE:2,FERRITIN:2,TIBC:2,IRON:2,RETICCTPCT:2 in the last 72 hours Imaging results:  Dg Chest 2 View  03/10/2012  *RADIOLOGY REPORT*  Clinical Data: Shortness of breath, cough.  CHEST - 2 VIEW  Comparison: 02/10/2012  Findings: Heart is borderline in size. There is hyperinflation of the lungs compatible with COPD.  Lungs are clear.  No effusions or acute bony abnormality.  IMPRESSION: COPD.  No active disease.  Original Report Authenticated By: Cyndie Chime, M.D.   Dg Chest Port 1 View  02/10/2012  *RADIOLOGY REPORT*  Clinical Data: Shortness of breath, cough  PORTABLE CHEST - 1 VIEW  Comparison: 11/26/2011  Findings: Normal heart size.  Slight vascular and interstitial prominence without definite edema.  No pneumonia, collapse, consolidation, effusion or pneumothorax.  Trachea midline.  Mild thoracic scoliosis.  IMPRESSION: No acute chest process.  Stable exam.  Original Report Authenticated By: Judie Petit. Ruel Favors, M.D.   Other results: EKG: Left axis deviation.   Patient Active Hospital Problem List: COPD with acute exacerbation (10/21/2011)   Assessment: Patient presents with what appears to be an exacerbation of her COPD. We'll go ahead and treat her with IV steroid, beta agonists, Spiriva and  Symbicort. I will also add Levaquin for treatment of exacerbation of COPD.    Hypertension ()   Assessment: Continue Norvasc and hydrochlorothiazide. The patient has left ventricular hypertrophy. It is unclear as to whether or not the patient may have a component of diastolic dysfunction occurring. I will check a pro BNP if it is within normal limits then I will proceed no further evidence elevated then we'll get a 2-D echocardiogram.      Lilas Diefendorf A. 03/10/2012, 5:09 PM

## 2012-03-10 NOTE — ED Notes (Signed)
Put  Pt on monitor ,O2  2 L,,EKG w as done

## 2012-03-11 DIAGNOSIS — J383 Other diseases of vocal cords: Secondary | ICD-10-CM

## 2012-03-11 DIAGNOSIS — R059 Cough, unspecified: Secondary | ICD-10-CM

## 2012-03-11 DIAGNOSIS — R05 Cough: Secondary | ICD-10-CM

## 2012-03-11 DIAGNOSIS — J45909 Unspecified asthma, uncomplicated: Secondary | ICD-10-CM

## 2012-03-11 LAB — TSH: TSH: 0.663 u[IU]/mL (ref 0.350–4.500)

## 2012-03-11 LAB — BASIC METABOLIC PANEL
Calcium: 9.4 mg/dL (ref 8.4–10.5)
GFR calc non Af Amer: 56 mL/min — ABNORMAL LOW (ref >90)
Sodium: 136 mEq/L (ref 135–145)

## 2012-03-11 MED ORDER — PANTOPRAZOLE SODIUM 40 MG PO TBEC
40.0000 mg | DELAYED_RELEASE_TABLET | Freq: Two times a day (BID) | ORAL | Status: DC
  Administered 2012-03-11 – 2012-03-12 (×3): 40 mg via ORAL
  Filled 2012-03-11 (×5): qty 1

## 2012-03-11 MED ORDER — NICOTINE 21 MG/24HR TD PT24
21.0000 mg | MEDICATED_PATCH | Freq: Every day | TRANSDERMAL | Status: DC
  Administered 2012-03-11 – 2012-03-12 (×2): 21 mg via TRANSDERMAL
  Filled 2012-03-11 (×3): qty 1

## 2012-03-11 MED ORDER — ALBUTEROL SULFATE (5 MG/ML) 0.5% IN NEBU
2.5000 mg | INHALATION_SOLUTION | RESPIRATORY_TRACT | Status: DC
  Administered 2012-03-11 – 2012-03-12 (×7): 2.5 mg via RESPIRATORY_TRACT
  Filled 2012-03-11 (×7): qty 0.5

## 2012-03-11 MED ORDER — METHYLPREDNISOLONE SODIUM SUCC 125 MG IJ SOLR
60.0000 mg | Freq: Four times a day (QID) | INTRAMUSCULAR | Status: DC
  Filled 2012-03-11 (×4): qty 0.96

## 2012-03-11 MED ORDER — METHYLPREDNISOLONE SODIUM SUCC 125 MG IJ SOLR
60.0000 mg | Freq: Two times a day (BID) | INTRAMUSCULAR | Status: DC
  Administered 2012-03-11 – 2012-03-12 (×3): 60 mg via INTRAVENOUS
  Filled 2012-03-11 (×6): qty 0.96

## 2012-03-11 MED ORDER — IPRATROPIUM BROMIDE 0.02 % IN SOLN
0.5000 mg | RESPIRATORY_TRACT | Status: DC
  Administered 2012-03-11 – 2012-03-12 (×7): 0.5 mg via RESPIRATORY_TRACT
  Filled 2012-03-11 (×7): qty 2.5

## 2012-03-11 MED ORDER — PREDNISONE 50 MG PO TABS
60.0000 mg | ORAL_TABLET | Freq: Every day | ORAL | Status: DC
  Administered 2012-03-12: 60 mg via ORAL
  Filled 2012-03-11 (×2): qty 1

## 2012-03-11 MED ORDER — TRAMADOL HCL 50 MG PO TABS
50.0000 mg | ORAL_TABLET | Freq: Four times a day (QID) | ORAL | Status: DC
  Administered 2012-03-11 – 2012-03-12 (×5): 50 mg via ORAL
  Filled 2012-03-11 (×11): qty 1

## 2012-03-11 NOTE — Progress Notes (Signed)
UR completed 

## 2012-03-11 NOTE — Consult Note (Signed)
Name: Barbara [name] MRN: [id number] DOB: 06/26/1951    LOS: 1  [place] [facility]  NOTE  Brief patient profile:  61 yobf quit smoking x 4 months pta with recurrent admit 4/3 for aecopd so pcccm svc asked to see on 4/4     Micro/sepsis markers:    Antibiotics: Levaquin (aecopd) 4/3 >>>  Tests / Events:   Hx: Patient is a 61 yobf  with a history of COPD who was recently discharged from the hospital less than a month ago. The patient states that she had a course of steroids and almost immediately after completing the steroids started having difficulty with breathing and a nonproductive cough. She states that this time and won't she's been having progressive dyspnea with exertion. Patient has been using her usual respiratory medications without much relief. She states that last night she had some PND and had to sleep in a sitting up position. She denies pedal edema or any change in urinary frequency or volume. The patient denies ever having had PFTs in the past. She has never been under care and pulmonologist.   No overt hb or sinus complaints.  Also denies any obvious fluctuation of symptoms with weather or environmental changes or other aggravating or alleviating factors except as outlined above.  ROS  At present neg for  any significant sore throat, dysphagia, dental problems, itching, sneezing,  nasal congestion or excess/ purulent secretions, ear ache,   fever, chills, sweats, unintended wt loss, pleuritic or exertional cp, hemoptysis, palpitations, orthopnea pnd or leg swelling.  Also denies presyncope, palpitations, heartburn, abdominal pain, anorexia, nausea, vomiting, diarrhea  or change in bowel or urinary habits, change in stools or urine, dysuria,hematuria,  rash, arthralgias, visual complaints, headache, numbness weakness or ataxia or problems with walking or coordination. No noted change in mood/affect or memory.                       Past Medical History  Diagnosis Date  .  Hypertension   . COPD (chronic obstructive pulmonary disease)   . Asthma   . Arthritis     right knee, neck  . Mouth trouble     infection from tooth that was pulled approx. mid Mar. 2013   Past Surgical History  Procedure Date  . Abdominal hysterectomy   . Tonsillectomy age 6  . Tubal ligation   . Shoulder surgery in age 68's    cyst removed from Bursa socket of left shoulder   Prior to Admission medications   Medication Sig Start Date End Date Taking? Authorizing Provider  albuterol (PROVENTIL HFA;VENTOLIN HFA) 108 (90 BASE) MCG/ACT inhaler Inhale 2 puffs into the lungs every 6 (six) hours as needed. For shortness of breath     Yes Historical Provider, MD  albuterol (PROVENTIL) (2.5 MG/3ML) 0.083% nebulizer solution Take 2.5 mg by nebulization every 3 (three) hours as needed. For shortness of breath   Yes Historical Provider, MD  amLODipine (NORVASC) 10 MG tablet Take 10 mg by mouth daily.     Yes Historical Provider, MD  budesonide-formoterol (SYMBICORT) 80-4.5 MCG/ACT inhaler Inhale 2 puffs into the lungs 2 (two) times daily. 10/23/11 10/22/12 Yes Ripudeep Jenna Luo, MD  hydrochlorothiazide (HYDRODIURIL) 25 MG tablet Take 25 mg by mouth daily.    Yes Historical Provider, MD  HYDROcodone-acetaminophen (VICODIN) 5-500 MG per tablet Take 1 tablet by mouth every 6 (six) hours as needed. For tooth pain   Yes Historical Provider, MD  ibuprofen (ADVIL,MOTRIN) 200 MG  tablet Take 400 mg by mouth every 6 (six) hours as needed. For toothache   Yes Historical Provider, MD  tiotropium (SPIRIVA) 18 MCG inhalation capsule Place 18 mcg into inhaler and inhale daily. For shortness of breath    Yes Historical Provider, MD   Allergies Allergies  Allergen Reactions  . Clonidine Derivatives     Coughing shortness of breath  . Codeine Other (See Comments)    Reaction: hallucination   . Lisinopril   . Nsaids   . Penicillins Hives  . Vicodin (Hydrocodone-Acetaminophen) Nausea And Vomiting  . Procaine  Rash    Family History Family History  Problem Relation Age of Onset  . Dementia Mother     Social History  reports that she quit smoking about 4 months ago. Her smoking use included Cigarettes. She has a 45 pack-year smoking history. She has never used smokeless tobacco. She reports that she does not drink alcohol or use illicit drugs.  Review Of Systems  Patient unable to provide  Vital Signs: Temp:  [97.7 F (36.5 C)-97.9 F (36.6 C)] 97.7 F (36.5 C) (04/04 0527) Pulse Rate:  [71-90] 71  (04/04 0527) Resp:  [18-25] 20  (04/04 0527) BP: (113-147)/(63-80) 114/68 mmHg (04/04 0527) SpO2:  [94 %-100 %] 96 % (04/04 0822) FiO2 (%):  [28 %] 28 % (04/03 1521) Weight:  [168 lb 8 oz (76.431 kg)] 168 lb 8 oz (76.431 kg) (04/03 1856) on 2lpm  Intake/Output Summary (Last 24 hours) at 03/11/12 1327 Last data filed at 03/11/12 0900  Gross per 24 hour  Intake    340 ml  Output      4 ml  Net    336 ml     Physical Examination: General: agitated hoarse bf moaning and wheezing in fetal position, won't answer question "what's bothering you" HEENT mild turbinate edema.  Oropharynx no thrush or excess pnd or cobblestoning.  No JVD or cervical adenopathy. Mild accessory muscle hypertrophy. Trachea midline, nl thryroid. Chest was hyperinflated by percussion with diminished breath sounds and moderate increased exp time without wheeze. Hoover sign positive at mid inspiration. Regular rate and rhythm without murmur gallop or rub or increase P2 or edema.  Abd: no hsm, nl excursion. Ext warm without cyanosis or clubbing.        Labs    Lab 03/11/12 0439 03/10/12 1115  NA 136 135  K 4.4 4.3  CL 99 99  CO2 28 23  BUN 22 12  CREATININE 1.04 0.88  GLUCOSE 137* 110*    Lab 03/10/12 1115  HGB 13.1  HCT 40.4  WBC 7.5  PLT 240        CXR 4/3: COPD. No active disease     ASSESSMENT AND PLAN    PULMONARY  Lab 03/10/12 1125  PHART 7.411*  PCO2ART 41.3  PO2ART 64.5*  HCO3  25.7*  O2SAT 92.0   DDX of  difficult airways managment all start with A and  include Adherence, Ace Inhibitors, Acid Reflux, Active Sinus Disease, Alpha 1 Antitripsin deficiency, Anxiety masquerading as Airways dz,  ABPA,  allergy(esp in young), Aspiration (esp in elderly), Adverse effects of DPI,  Active smokers, plus two Bs  = Bronchiectasis and Beta blocker use..and one C= CHF   ? Acid reflux with vcd > max gerd  ? Anxiety with same presentation > dx of exclusion typically  ? Active smoking (surreptitious) > check  Cotinine levels   ? CHF > no evidence for this   A:  CARDIOVASCULAR  Lab 03/10/12 1115  TROPONINI <0.30  LATICACIDVEN --  PROBNP 99.8   A:    Sandrea Hughs, MD Pulmonary and Critical Care Medicine [facility] Cell [phone]  If no answer call [phone]   03/11/2012, 1:27 PM

## 2012-03-11 NOTE — Progress Notes (Signed)
Subjective: Pt states that she doesn't feel significantly better than yesterday. However, she appears much improved since yesterday in terms of work of breathing and oxygenation. Pt does not identify any significant allergic component to her illness but states that her COPD exacerbations are triggered with extremes in temperature. Objective: Filed Vitals:   03/10/12 2107 03/11/12 0210 03/11/12 0527 03/11/12 0822  BP: 147/63  114/68   Pulse: 84  71   Temp: 97.9 F (36.6 C)  97.7 F (36.5 C)   TempSrc: Oral  Oral   Resp: 20  20   Height:      Weight:      SpO2: 95% 96% 94% 96%   Weight change:   Intake/Output Summary (Last 24 hours) at 03/11/12 1148 Last data filed at 03/11/12 0900  Gross per 24 hour  Intake    340 ml  Output      4 ml  Net    336 ml    General: Alert, awake, oriented x3, in no acute distress.  HEENT: [place]/AT PEERL, EOMI Neck: Trachea midline,  no masses, no thyromegal,y no JVD, no carotid bruit OROPHARYNX:  Moist, No exudate/ erythema/lesions.  Heart: Regular rate and rhythm, without murmurs, rubs, gallops, PMI non-displaced, no heaves or thrills on palpation.  Lungs: Very mild wheezing with good air entry. No increased vocal fremitus resonant to percussion  Abdomen: Soft, nontender, nondistended, positive bowel sounds, no masses no hepatosplenomegaly noted..     Lab Results:  Basename 03/11/12 0439 03/10/12 1115  NA 136 135  K 4.4 4.3  CL 99 99  CO2 28 23  GLUCOSE 137* 110*  BUN 22 12  CREATININE 1.04 0.88  CALCIUM 9.4 9.3  MG -- --  PHOS -- --    Basename 03/10/12 1115  AST 43*  ALT 29  ALKPHOS 78  BILITOT 0.3  PROT 7.8  ALBUMIN 4.0   No results found for this basename: LIPASE:2,AMYLASE:2 in the last 72 hours  Basename 03/10/12 1115  WBC 7.5  NEUTROABS 4.8  HGB 13.1  HCT 40.4  MCV 94.6  PLT 240    Basename 03/10/12 1115  CKTOTAL --  CKMB --  CKMBINDEX --  TROPONINI <0.30   No components found with this basename: POCBNP:3 No  results found for this basename: DDIMER:2 in the last 72 hours No results found for this basename: HGBA1C:2 in the last 72 hours No results found for this basename: CHOL:2,HDL:2,LDLCALC:2,TRIG:2,CHOLHDL:2,LDLDIRECT:2 in the last 72 hours  Basename 03/10/12 1115  TSH 0.663  T4TOTAL --  T3FREE --  THYROIDAB --   No results found for this basename: VITAMINB12:2,FOLATE:2,FERRITIN:2,TIBC:2,IRON:2,RETICCTPCT:2 in the last 72 hours  Micro Results: No results found for this or any previous visit (from the past 240 hour(s)).  Studies/Results: Dg Chest 2 View  03/10/2012  *RADIOLOGY REPORT*  Clinical Data: Shortness of breath, cough.  CHEST - 2 VIEW  Comparison: 02/10/2012  Findings: Heart is borderline in size. There is hyperinflation of the lungs compatible with COPD.  Lungs are clear.  No effusions or acute bony abnormality.  IMPRESSION: COPD.  No active disease.  Original Report Authenticated By: Cyndie Chime, M.D.   Dg Chest Port 1 View  02/10/2012  *RADIOLOGY REPORT*  Clinical Data: Shortness of breath, cough  PORTABLE CHEST - 1 VIEW  Comparison: 11/26/2011  Findings: Normal heart size.  Slight vascular and interstitial prominence without definite edema.  No pneumonia, collapse, consolidation, effusion or pneumothorax.  Trachea midline.  Mild thoracic scoliosis.  IMPRESSION: No acute  chest process.  Stable exam.  Original Report Authenticated By: Judie Petit. Ruel Favors, M.D.    Medications: I have reviewed the patient's current medications. Scheduled Meds:   . albuterol  2.5 mg Nebulization Q6H  . amLODipine  10 mg Oral Daily  . budesonide-formoterol  2 puff Inhalation BID  . enoxaparin  40 mg Subcutaneous Q24H  . hydrochlorothiazide  25 mg Oral Daily  . levofloxacin (LEVAQUIN) IV  500 mg Intravenous Q24H  . methylPREDNISolone (SOLU-MEDROL) injection  60 mg Intravenous Q6H  . sodium chloride  3 mL Intravenous Q12H  . tiotropium  18 mcg Inhalation Daily  . DISCONTD: albuterol  5 mg  Nebulization Q4H  . DISCONTD: sodium chloride  3 mL Intravenous Q12H   Continuous Infusions:  PRN Meds:.sodium chloride, albuterol, sodium chloride Assessment/Plan: Patient Active Hospital Problem List: COPD with acute exacerbation (10/21/2011)   Assessment: Continue solumedrol through today then if still doing okay, transition to oral prednisone. Continue Levaquin and change to by mouth route. Continue Symbicort, Spiriva and nebulized B-agonists.  I have asked Pulmonary to see patient in consultation given the frequency of her exacerbations while being compliant with appropriate therapy.  Hypertension ()   Assessment: B/P well controlled.   Plan: continue current medications   LOS: 1 day

## 2012-03-12 LAB — NICOTINE/COTININE METABOLITES: Cotinine: >100 ng/mL

## 2012-03-12 MED ORDER — LORAZEPAM 0.5 MG PO TABS
0.5000 mg | ORAL_TABLET | Freq: Once | ORAL | Status: AC
  Administered 2012-03-12: 0.5 mg via ORAL
  Filled 2012-03-12: qty 1

## 2012-03-12 MED ORDER — ALPRAZOLAM 0.5 MG PO TABS
0.5000 mg | ORAL_TABLET | Freq: Three times a day (TID) | ORAL | Status: DC | PRN
  Administered 2012-03-12: 0.5 mg via ORAL
  Filled 2012-03-12: qty 1

## 2012-03-12 MED ORDER — PANTOPRAZOLE SODIUM 40 MG PO TBEC: 40.0000 mg | DELAYED_RELEASE_TABLET | Freq: Two times a day (BID) | ORAL | Status: DC

## 2012-03-12 MED ORDER — ALPRAZOLAM 0.5 MG PO TABS: 0.5000 mg | ORAL_TABLET | Freq: Three times a day (TID) | ORAL | Status: AC | PRN

## 2012-03-12 MED ORDER — PREDNISONE 10 MG PO TABS: ORAL_TABLET | ORAL | Status: DC

## 2012-03-12 MED ORDER — TRAMADOL HCL 50 MG PO TABS: 50.0000 mg | ORAL_TABLET | Freq: Four times a day (QID) | ORAL | Status: DC

## 2012-03-12 NOTE — Consult Note (Signed)
Name: Barbara [name] MRN: [id number] DOB: Aug 20, 1951    LOS: 2  Barbara [name] PCCCM  NOTE  Brief patient profile:  61 yobf quit smoking x 4 months pta with recurrent admit 4/3 for AECOPD.  PCCM svc asked to see on 4/4     Micro/sepsis markers:    Antibiotics: Levaquin (aecopd) 4/3 >>>  Tests / Events:     Vital Signs: Temp:  [97.7 F (36.5 C)-98.3 F (36.8 C)] 97.7 F (36.5 C) (04/05 0559) Pulse Rate:  [62-73] 62  (04/05 0559) Resp:  [18-20] 18  (04/05 0559) BP: (96-113)/(57-72) 103/57 mmHg (04/05 0559) SpO2:  [93 %-100 %] 93 % (04/05 1140) on 2lpm  Intake/Output Summary (Last 24 hours) at 03/12/12 1401 Last data filed at 03/12/12 0600  Gross per 24 hour  Intake    700 ml  Output      3 ml  Net    697 ml     Physical Examination: General: wdwn female in NAD HEENT mild turbinate edema. .  No JVD or cervical adenopathy. Mild accessory muscle hypertrophy. Trachea midline, nl thryroid.  Hoarse voice (gravel like) Chest was hyperinflated by percussion with diminished breath sounds and moderate increased exp time without wheeze. Regular rate and rhythm without murmur gallop or rub or increase P2 or edema.  Abd: no hsm, nl excursion. Ext warm without cyanosis or clubbing.      Labs    Lab 03/11/12 0439 03/10/12 1115  NA 136 135  K 4.4 4.3  CL 99 99  CO2 28 23  BUN 22 12  CREATININE 1.04 0.88  GLUCOSE 137* 110*    Lab 03/10/12 1115  HGB 13.1  HCT 40.4  WBC 7.5  PLT 240    CXR 4/3: COPD. No active disease   ASSESSMENT AND PLAN    PULMONARY  Lab 03/10/12 1125  PHART 7.411*  PCO2ART 41.3  PO2ART 64.5*  HCO3 25.7*  O2SAT 92.0   DDX of  difficult airways managment all start with A and  include Adherence, Ace Inhibitors, Acid Reflux, Active Sinus Disease, Alpha 1 Antitripsin deficiency, Anxiety masquerading as Airways dz,  ABPA,  allergy(esp in young), Aspiration (esp in elderly), Adverse effects of DPI,  Active smokers, plus two Bs  = Bronchiectasis and  Beta blocker use..and one C= CHF   ? Acid reflux with vcd > max gerd  ? Anxiety with same presentation > dx of exclusion typically  ? Active smoking (surreptitious) > check  Cotinine levels   ? CHF > no evidence for this    Likely patient does have some underlying COPD given long smoking history but large component of upper airway dysfunction here with significant anxiety.          CARDIOVASCULAR  Lab 03/10/12 1115  TROPONINI <0.30  LATICACIDVEN --  PROBNP 99.8      Plan d/c per [facility] 4/5.  F/U in office with Dr. Sherene Sires as scheduled.     [name], NP-C [facility] Pgr: [phone]  Pt independently  seen and examined and available cxr's reviewed and I agree with above findings/ imp/ plan   Sandrea Hughs, MD Pulmonary and Critical Care Medicine [facility] Cell [phone]

## 2012-03-15 ENCOUNTER — Ambulatory Visit (INDEPENDENT_AMBULATORY_CARE_PROVIDER_SITE_OTHER): Payer: Self-pay | Admitting: Internal Medicine

## 2012-03-15 ENCOUNTER — Encounter: Payer: Self-pay | Admitting: Internal Medicine

## 2012-03-15 VITALS — BP 112/78 | HR 79 | Temp 98.7°F | Ht 65.0 in | Wt 172.8 lb

## 2012-03-15 DIAGNOSIS — J449 Chronic obstructive pulmonary disease, unspecified: Secondary | ICD-10-CM

## 2012-03-15 MED ORDER — MOMETASONE FURO-FORMOTEROL FUM 200-5 MCG/ACT IN AERO: INHALATION_SPRAY | RESPIRATORY_TRACT | Status: DC

## 2012-03-15 NOTE — Patient Instructions (Signed)
Work on inhaler technique:  relax and gently blow all the way out then take a nice smooth deep breath back in, triggering the inhaler at same time you start breathing in.  Hold for up to 5 seconds if you can.  Rinse and gargle with water when done   If your mouth or throat starts to bother you,   I suggest you time the inhaler to your dental care and after using the inhaler(s) brush teeth and tongue with a baking soda containing toothpaste and when you rinse this out, gargle with it first to see if this helps your mouth and throat.     Stop symbicort  Dulera 200 Take 2 puffs first thing in am and then another 2 puffs about 12 hours later.   Continue protonix 40 mg Take 30-60 min before first meal of the day   For cough use Tramadol as needed  Only use your albuterol as a rescue medication(Plan B Albuterol hfa/ventolin vs Plan C nebulizer)  to be used if you can't catch your breath by resting or doing a relaxed purse lip breathing pattern. The less you use it, the better it will work when you need it.

## 2012-03-15 NOTE — Assessment & Plan Note (Signed)
-   HFA 50% p coaching 03/15/2012     - Spirometry 03/15/2012 FEV1 1.54 (74%) ratio 65  GOLD II with apparent large reversible component ? Asthma/ ? VCD  The proper method of use, as well as anticipated side effects, of a metered-dose inhaler are discussed and demonstrated to the patient. Improved effectiveness after extensive coaching during this visit to a level of approximately  50% but not better  See instructions for specific recommendations which were reviewed directly with the patient who was given a copy with highlighter outlining the key components.

## 2012-03-15 NOTE — Progress Notes (Signed)
Subjective:     Patient ID: Barbara [name], female   DOB: December 12, 1950, 61 y.o.   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    The patient states that she had a course of steroids and almost immediately after completing the steroids started having difficulty with breathing and a nonproductive cough.   The patient denied ever having had PFTs in the past. She has never been under care and pulmonologist due to cost issues. rx as aecopd with component of ab and vcd and discharged 03/12/12  03/15/2012 f/u ov/[facility] post hosp f/u tapering prednisone down to 20 mg today and 10 mg 4/9 and stop. States fine when on prednisone unless extreme temperatures but even on "fine days" still wakes up and used alb neb. Minimal cough, hoarsness.  Sleeping ok without nocturnal  or early am exacerbation  of respiratory  c/o's or need for noct saba. Also denies any obvious fluctuation of symptoms with weather or environmental changes or other aggravating or alleviating factors except as outlined above   Review of Systems     Objective:   Physical Exam    obese mod hoarse bf nad Wt 172 03/15/2012 HEENT mild turbinate edema.  Oropharynx no thrush or excess pnd or cobblestoning.  No JVD or cervical adenopathy. Mild accessory muscle hypertrophy. Trachea midline, nl thryroid. Chest was min hyperinflated by percussion with diminished breath sounds and mild increased exp time without wheeze. Hoover sign positive at end inspiration. Regular rate and rhythm without murmur gallop or rub or increase P2 or edema.  Abd: no hsm, nl excursion. Ext warm without cyanosis or clubbing.     Assessment:          Plan:

## 2012-03-16 ENCOUNTER — Telehealth: Payer: Self-pay | Admitting: Internal Medicine

## 2012-03-16 NOTE — Telephone Encounter (Signed)
I spoke with pt and she states she is wanting to know does she stop her spiriva. I advised her according to MW d/c instructions it only says to stop the symbicort. She voiced her understanding and had no further questions

## 2012-03-31 NOTE — Discharge Summary (Signed)
Barbara [name] MRN: [id number] DOB/AGE: November 15, 1951 61 y.o.  Admit date: 03/10/2012 Discharge date: 03/31/2012  Primary Care Physician:  Barbara Junes, MD, MD   Discharge Diagnoses:   Patient Active Problem List  Diagnoses  . Hypertension  . COPD (chronic obstructive pulmonary disease)  . COPD with acute exacerbation  . Nicotine abuse    DISCHARGE MEDICATION: Medication List  As of 03/31/2012  1:04 PM   STOP taking these medications         ibuprofen 200 MG tablet         TAKE these medications         albuterol 108 (90 BASE) MCG/ACT inhaler   Commonly known as: PROVENTIL HFA;VENTOLIN HFA   Inhale 2 puffs into the lungs every 6 (six) hours as needed. For shortness of breath        albuterol (2.5 MG/3ML) 0.083% nebulizer solution   Commonly known as: PROVENTIL   Take 2.5 mg by nebulization every 3 (three) hours as needed. For shortness of breath      ALPRAZolam 0.5 MG tablet   Commonly known as: XANAX   Take 1 tablet (0.5 mg total) by mouth 3 (three) times daily as needed for anxiety.      amLODipine 10 MG tablet   Commonly known as: NORVASC   Take 10 mg by mouth daily.      budesonide-formoterol 80-4.5 MCG/ACT inhaler   Commonly known as: SYMBICORT   Inhale 2 puffs into the lungs 2 (two) times daily.      hydrochlorothiazide 25 MG tablet   Commonly known as: HYDRODIURIL   Take 25 mg by mouth daily.      pantoprazole 40 MG tablet   Commonly known as: PROTONIX   Take 1 tablet (40 mg total) by mouth 2 (two) times daily before a meal.      predniSONE 10 MG tablet   Commonly known as: DELTASONE   50 mg PO daily x 1 then, 40 mg PO daily x 1 then, 30 mg PO daily x 1 then, 20 mg PO daily x 1 then, 10 mg PO daily x 1 then stop.      tiotropium 18 MCG inhalation capsule   Commonly known as: SPIRIVA   Place 18 mcg into inhaler and inhale daily. For shortness of breath              Consults:     SIGNIFICANT DIAGNOSTIC STUDIES:  Dg Chest 2  View  03/10/2012  *RADIOLOGY REPORT*  Clinical Data: Shortness of breath, cough.  CHEST - 2 VIEW  Comparison: 02/10/2012  Findings: Heart is borderline in size. There is hyperinflation of the lungs compatible with COPD.  Lungs are clear.  No effusions or acute bony abnormality.  IMPRESSION: COPD.  No active disease.  Original Report Authenticated By: Barbara [name], M.D.          No results found for this or any previous visit (from the past 240 hour(s)).  BRIEF ADMITTING H & P: Patient is a 61 year old female with a history of COPD who was recently discharged from the hospital less than a month ago. The patient states that she had a course of steroids and almost immediately after completing the steroids started having difficulty with breathing and a nonproductive cough. She states that this time and won't she's been having progressive dyspnea with exertion. Patient has been using her usual respiratory medications without much relief. She states that last night she had some PND and had to  sleep in a sitting up position. She denies pedal edema or any change in urinary frequency or volume. The patient denies ever having had PFTs in the past. She has never been under care and pulmonologist.  She denies any fever or chills, any nausea vomiting or diarrhea, any dizziness loss of consciousness.     Hospital Course:  Present on Admission:  .Vocal Cord Dysfunction: Pt presented with complaints of difficulty in breathing which reoccurred almost immediately after discontinuing steroids. There was a suspicion of VCD and pulmonology was consulted and agreed with Vocal Cord Dysfunction. They advised to complete taper of steroids and treat any component of acid reflux and anxiety.  Marland KitchenCOPD with acute exacerbation: see above.  .Anxiety: It was felt that her anxiety contributed significantly yo her VCD. Thus patient is being sent home on scheduled Xanax.   Disposition and Follow-up:  Pt to follow up with Dr.  Sherene [name] in the clinic on 03/15/2012.  Discharge Orders    Future Appointments: Provider: Department: Dept Phone: Center:   04/14/2012 8:45 AM Barbara Cowden, MD [facility] [phone] None     Future Orders Please Complete By Expires   Diet - low sodium heart healthy      Increase activity slowly         DISCHARGE EXAM:  General: Alert, awake, oriented x3, in no acute distress.  Vital Signs:Blood pressure 117/76, pulse 68, temperature 97.7 F (36.5 C), temperature source Oral, resp. rate 18, height 5\' 5"  (1.651 m), weight 76.431 kg (168 lb 8 oz), SpO2 94.00%. HEENT: [place]/AT PEERL, EOMI  Neck: Trachea midline, no masses, no thyromegal,y no JVD, no carotid bruit  OROPHARYNX: Moist, No exudate/ erythema/lesions.  Heart: Regular rate and rhythm, without murmurs, rubs, gallops, PMI non-displaced, no heaves or thrills on palpation.  Lungs: Very mild wheezing with good air entry. No increased vocal fremitus resonant to percussion  Abdomen: Soft, nontender, nondistended, positive bowel sounds, no masses no hepatosplenomegaly noted..    No results found for this basename: NA:2,K:2,CL:2,CO2:2,GLUCOSE:2,BUN:2,CREATININE:2,CALCIUM:2,MG:2,PHOS:2 in the last 72 hours No results found for this basename: AST:2,ALT:2,ALKPHOS:2,BILITOT:2,PROT:2,ALBUMIN:2 in the last 72 hours No results found for this basename: LIPASE:2,AMYLASE:2 in the last 72 hours No results found for this basename: WBC:2,NEUTROABS:2,HGB:2,HCT:2,MCV:2,PLT:2 in the last 72 hours  Total time for discharge process including face to face time approximately 42 minutes.  Signed: Hector Venne A. 03/31/2012, 1:04 PM

## 2012-04-14 ENCOUNTER — Ambulatory Visit (INDEPENDENT_AMBULATORY_CARE_PROVIDER_SITE_OTHER): Payer: Self-pay | Admitting: Internal Medicine

## 2012-04-14 ENCOUNTER — Encounter: Payer: Self-pay | Admitting: Internal Medicine

## 2012-04-14 VITALS — BP 110/74 | HR 60 | Temp 98.5°F | Ht 65.0 in | Wt 176.8 lb

## 2012-04-14 DIAGNOSIS — R0609 Other forms of dyspnea: Secondary | ICD-10-CM

## 2012-04-14 DIAGNOSIS — J449 Chronic obstructive pulmonary disease, unspecified: Secondary | ICD-10-CM

## 2012-04-14 DIAGNOSIS — R0600 Dyspnea, unspecified: Secondary | ICD-10-CM

## 2012-04-14 DIAGNOSIS — R059 Cough, unspecified: Secondary | ICD-10-CM

## 2012-04-14 DIAGNOSIS — R05 Cough: Secondary | ICD-10-CM

## 2012-04-14 MED ORDER — FAMOTIDINE 20 MG PO TABS: ORAL_TABLET | ORAL | Status: DC

## 2012-04-14 MED ORDER — TRAMADOL HCL 50 MG PO TABS: 50.0000 mg | ORAL_TABLET | Freq: Four times a day (QID) | ORAL | Status: AC

## 2012-04-14 MED ORDER — PREDNISONE (PAK) 10 MG PO TABS: ORAL_TABLET | ORAL | Status: AC

## 2012-04-14 NOTE — Assessment & Plan Note (Addendum)
-   HFA 50% p coaching 03/15/2012 > 75% 04/14/2012     - Spirometry 03/15/2012 FEV1 1.54 (74%) ratio 65  GOLD II and much better x for cough ? Related to spiriva, which she may not need > try off  The proper method of use, as well as anticipated side effects, of a metered-dose inhaler are discussed and demonstrated to the patient. Improved effectiveness after extensive coaching during this visit to a level of approximately  75%

## 2012-04-14 NOTE — Patient Instructions (Signed)
Take delsym two tsp every 12 hours and supplement if needed with  tramadol 50 mg up to 1-2 every 4 hours to suppress the urge to cough. Swallowing water or using ice chips/non mint and menthol containing candies (such as lifesavers or sugarless [place] ranchers) are also effective.  You should rest your voice and avoid activities that you know make you cough.  Once you have eliminated the cough for 3 straight days try reducing the tramadol first,  then the delsym as tolerated.    If not improving, start Prednisone 10 mg take  4 each am x 2 days,   2 each am x 2 days,  1 each am x2days and stop  Stop spiriva  Add pepcid 20 mg one at bedtime  Please schedule a follow up office visit in 4 weeks, sooner if needed with pft's bring all meds/inhalers/neb solutions with you

## 2012-04-14 NOTE — Assessment & Plan Note (Signed)
Most c/w Upper airway cough syndrome, so named because it's frequently impossible to sort out how much is  CR/sinusitis with freq throat clearing (which can be related to primary GERD)   vs  causing  secondary (" extra esophageal")  GERD from wide swings in gastric pressure that occur with throat clearing, often  promoting self use of mint and menthol lozenges that reduce the lower esophageal sphincter tone and exacerbate the problem further in a cyclical fashion.   These are the same pts (now being labeled as having "irritable larynx syndrome" by some cough centers) who not infrequently have a history of having failed to tolerate ace inhibitors,  dry powder inhalers or biphosphonates or report having atypical reflux symptoms that don't respond to standard doses of PPI , and are easily confused as having aecopd or asthma flares by even experienced allergists/ pulmonologists.  Try eliminate cycle of gerd/cough with H2 and ultram.

## 2012-04-14 NOTE — Progress Notes (Signed)
Subjective:     Patient ID: Barbara [name], female   DOB: 26-Mar-1951, 61 y.o.   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   The patient states that she had a course of steroids and almost immediately after completing the steroids started having difficulty with breathing and a nonproductive cough.   The patient denied ever having had PFTs in the past. She has never been under care and pulmonologist due to cost issues. rx as aecopd with component of ab and vcd and discharged 03/12/12  03/15/2012 f/u ov/Janit Cutter post hosp f/u tapering prednisone down to 20 mg today and 10 mg 4/9 and stop. States fine when on prednisone unless extreme temperatures but even on "fine days" still wakes up and used alb neb. Minimal cough, hoarsness. rec Work on inhaler technique:       Stop symbicort Dulera 200 Take 2 puffs first thing in am and then another 2 puffs about 12 hours later.  Continue protonix 40 mg Take 30-60 min before first meal of the day  For cough use Tramadol as needed Only use your albuterol as a rescue medication(Plan B Albuterol hfa/ventolin vs Plan C nebulizer)      04/14/2012 f/u ov/Mallory Schaad cc much better breathing, still dry cough, no need for saba rescue.  Could not tol ppi due to stomach cramping > resolved off.  Sleeping ok without nocturnal  or early am exacerbation  of respiratory  c/o's or need for noct saba. Also denies any obvious fluctuation of symptoms with weather or environmental changes or other aggravating or alleviating factors except as outlined above   ROS  At present neg for  any significant sore throat, dysphagia, dental problems, itching, sneezing,  nasal congestion or excess/ purulent secretions, ear ache,   fever, chills, sweats, unintended wt loss, pleuritic or exertional cp, hemoptysis, palpitations, orthopnea pnd or leg swelling.   Also denies presyncope, palpitations, heartburn, abdominal pain, anorexia, nausea, vomiting, diarrhea  or change in bowel or urinary habits, change in stools or urine, dysuria,hematuria,  rash, arthralgias, visual complaints, headache, numbness weakness or ataxia or problems with walking or coordination. No noted change in mood/affect or memory.                        Objective:   Physical Exam    obese mod hoarse bf nad Wt 172 03/15/2012 > 04/14/2012  176 HEENT mild turbinate edema.  Oropharynx no thrush or excess pnd or cobblestoning.  No JVD or cervical adenopathy. Mild accessory muscle hypertrophy. Trachea midline, nl thryroid. Chest was min hyperinflated by percussion with diminished breath sounds and mild increased exp time without wheeze. Hoover sign positive at end inspiration. Regular rate and rhythm without murmur gallop or rub or increase P2 or edema.  Abd: no hsm, nl excursion. Ext warm without cyanosis or clubbing.     Assessment:          Plan:

## 2012-04-15 ENCOUNTER — Telehealth: Payer: Self-pay | Admitting: Internal Medicine

## 2012-04-15 NOTE — Telephone Encounter (Signed)
MW, this pt lives here in [place], is there a specific PCP you would rec for her? Pls advise, thanks!

## 2012-04-16 NOTE — Telephone Encounter (Signed)
i would direct her to the [place] office closest/most convenient to her home as we are all on the same computer

## 2012-04-16 NOTE — Telephone Encounter (Signed)
Called, spoke with pt.  I informed her of below per Dr. Sherene Sires.  She verbalized understanding of this but states Medicaid will not pay for [facility] Drs.  Pt states she will "see what I can do" on trying to find a PCP who accepts Medicaid.  She will call back if she has any questions for MW regarding the doctor or if anything further is needed.

## 2012-05-10 ENCOUNTER — Telehealth: Payer: Self-pay | Admitting: Internal Medicine

## 2012-05-10 MED ORDER — TRAMADOL HCL 50 MG PO TABS: 50.0000 mg | ORAL_TABLET | Freq: Four times a day (QID) | ORAL | Status: AC | PRN

## 2012-05-10 NOTE — Telephone Encounter (Signed)
Spoke with pt. She states that her cough had been doing better until she ran out of tramadol. Now the cough is back where it was, dry and hacky. She states not able to produce any sputum. Would like to know if okay to refill the tramadol until her next appt with MW on 05-24-12. Thanks!

## 2012-05-10 NOTE — Telephone Encounter (Signed)
Pt aware refill sent. Duriel Deery, CMA  

## 2012-05-10 NOTE — Telephone Encounter (Signed)
That is fine to refill tramadol 50mg  #45, same rx . 1 refill  Keep ov .  Please contact office for sooner follow up if symptoms do not improve or worsen or seek emergency care

## 2012-05-10 NOTE — Telephone Encounter (Signed)
Pt called back to add the following: she "just had another coughing jag"- also has anxiety which may also trigger this- recent loss of family member- requests to have tramadol called in asap. Hazel Sams

## 2012-05-18 NOTE — ED Provider Notes (Signed)
History     CSN: [id number]  Arrival date & time 03/10/12  1033   First MD Initiated Contact with Patient 03/10/12 1047      Chief Complaint  Patient presents with  . Respiratory Distress    (Consider location/radiation/quality/duration/timing/severity/associated sxs/prior treatment) HPI  Past Medical History  Diagnosis Date  . Hypertension   . COPD (chronic obstructive pulmonary disease)   . Asthma   . Arthritis     right knee, neck  . Mouth trouble     infection from tooth that was pulled approx. mid Mar. 2013    Past Surgical History  Procedure Date  . Abdominal hysterectomy   . Tonsillectomy age 44  . Tubal ligation   . Shoulder surgery in age 46's    cyst removed from Bursa socket of left shoulder    Family History  Problem Relation Age of Onset  . Dementia Mother     History  Substance Use Topics  . Smoking status: Former Smoker -- 1.0 packs/day for 45 years    Types: Cigarettes    Quit date: 10/27/2011  . Smokeless tobacco: Never Used  . Alcohol Use: No    OB History    Grav Para Term Preterm Abortions TAB SAB Ect Mult Living                  Review of Systems  Allergies  Clonidine derivatives; Codeine; Lisinopril; Nsaids; Penicillins; Vicodin; and Procaine  Home Medications   Current Outpatient Rx  Name Route Sig Dispense Refill  . ALBUTEROL SULFATE HFA 108 (90 BASE) MCG/ACT IN AERS Inhalation Inhale 2 puffs into the lungs every 6 (six) hours as needed. For shortness of breath      . ALBUTEROL SULFATE (2.5 MG/3ML) 0.083% IN NEBU Nebulization Take 2.5 mg by nebulization every 3 (three) hours as needed. For shortness of breath    . AMLODIPINE BESYLATE 10 MG PO TABS Oral Take 10 mg by mouth daily.      Marland Kitchen HYDROCHLOROTHIAZIDE 25 MG PO TABS Oral Take 25 mg by mouth daily.     Marland Kitchen FAMOTIDINE 20 MG PO TABS  One at bedtime 30 tablet 11  . MOMETASONE FURO-FORMOTEROL FUM 200-5 MCG/ACT IN AERO  Take 2 puffs first thing in am and then another 2 puffs  about 12 hours later.    Marland Kitchen PANTOPRAZOLE SODIUM 40 MG PO TBEC Oral Take 40 mg by mouth daily before breakfast.    . TRAMADOL HCL 50 MG PO TABS Oral Take 1 tablet (50 mg total) by mouth every 6 (six) hours as needed. 45 tablet 0    BP 117/76  Pulse 68  Temp(Src) 97.7 F (36.5 C) (Oral)  Resp 18  Ht 5\' 5"  (1.651 m)  Wt 168 lb 8 oz (76.431 kg)  BMI 28.04 kg/m2  SpO2 94%  Physical Exam  ED Course  Procedures (including critical care time)  Labs Reviewed  COMPREHENSIVE METABOLIC PANEL - Abnormal; Notable for the following:    Glucose, Bld 110 (*)    AST 43 (*)    GFR calc non Af Amer 68 (*)    GFR calc Af Amer 79 (*)    All other components within normal limits  BLOOD GAS, ARTERIAL - Abnormal; Notable for the following:    pH, Arterial 7.411 (*)    pO2, Arterial 64.5 (*)    Bicarbonate 25.7 (*)    All other components within normal limits  BASIC METABOLIC PANEL - Abnormal; Notable for the following:  Glucose, Bld 137 (*)    GFR calc non Af Amer 56 (*)    GFR calc Af Amer 65 (*)    All other components within normal limits  CBC  DIFFERENTIAL  TROPONIN I  PRO B NATRIURETIC PEPTIDE  TSH  NICOTINE/COTININE METABOLITES  LAB REPORT - SCANNED   No results found.   1. Hypertension   2. COPD (chronic obstructive pulmonary disease)       MDM  Chart not documented at time of care and I am unable to document now.  6:57 AM       Hilario Quarry, MD 05/18/12 [phone]

## 2012-05-24 ENCOUNTER — Encounter: Payer: Self-pay | Admitting: Internal Medicine

## 2012-05-24 ENCOUNTER — Ambulatory Visit (INDEPENDENT_AMBULATORY_CARE_PROVIDER_SITE_OTHER): Payer: Self-pay | Admitting: Internal Medicine

## 2012-05-24 VITALS — BP 122/80 | HR 59 | Temp 98.2°F | Ht 65.0 in | Wt 165.0 lb

## 2012-05-24 DIAGNOSIS — J449 Chronic obstructive pulmonary disease, unspecified: Secondary | ICD-10-CM

## 2012-05-24 DIAGNOSIS — R0609 Other forms of dyspnea: Secondary | ICD-10-CM

## 2012-05-24 DIAGNOSIS — R0600 Dyspnea, unspecified: Secondary | ICD-10-CM

## 2012-05-24 LAB — PULMONARY FUNCTION TEST

## 2012-05-24 NOTE — Progress Notes (Signed)
PFT done today. 

## 2012-05-24 NOTE — Progress Notes (Signed)
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   The patient states that she had a course of steroids and almost immediately after completing the steroids started having difficulty with breathing and a nonproductive cough.   The patient denied ever having had PFTs in the past. She has never been under care and pulmonologist due to cost issues. rx as aecopd with component of ab and vcd and discharged 03/12/12  03/15/2012 f/u ov/Barbara [name] post hosp f/u tapering prednisone down to 20 mg today and 10 mg 4/9 and stop. States fine when on prednisone unless extreme temperatures but even on "fine days" still wakes up and used alb neb. Minimal cough, hoarsness. rec Work on inhaler technique:       Stop symbicort Dulera 200 Take 2 puffs first thing in am and then another 2 puffs about 12 hours later.  Continue protonix 40 mg Take 30-60 min before first meal of the day  For cough use Tramadol as needed Only use your albuterol as a rescue medication(Plan B Albuterol hfa/ventolin vs Plan C nebulizer)      04/14/2012 f/u ov/Barbara [name] cc much better breathing, still dry cough, no need for saba rescue.  Could not tol ppi due to stomach cramping > resolved off. rec Take delsym two tsp every 12 hours and supplement if needed with  tramadol 50 mg up to 1-2 every 4 hours to suppress the urge to cough.     If not improving, start Prednisone 10 mg take  4 each am x 2 days,   2 each am x 2 days,  1 each am x2days and stop Stop spiriva Add pepcid 20 mg one at bedtime Please schedule a follow up office visit in 4 weeks, sooner if needed with pft's bring all meds/inhalers/neb solutions with you    05/24/2012 f/u ov/Barbara [name] cc best she's felt in a while using dulera  and min need for saba daytime and never the neb,  no purulent sputum or limiting sob, no variability to symptoms  Sleeping ok without nocturnal  or early am exacerbation  of respiratory  c/o's or need for noct saba. Also denies any obvious fluctuation of symptoms with weather or environmental changes or other aggravating or alleviating factors except as outlined above   ROS  At present neg for  any significant sore throat, dysphagia, dental problems, itching, sneezing,  nasal congestion or excess/ purulent secretions, ear ache,   fever, chills, sweats, unintended wt loss, pleuritic or exertional cp, hemoptysis, palpitations, orthopnea pnd or leg swelling.  Also denies presyncope, palpitations, heartburn, abdominal pain, anorexia, nausea, vomiting, diarrhea  or change in bowel or urinary habits, change in stools or urine, dysuria,hematuria,  rash, arthralgias, visual complaints, headache, numbness weakness or ataxia or problems with walking or coordination. No noted change in mood/affect or memory.                 Objective:   Physical Exam  obese  bf nad not hoarse Wt 172 03/15/2012 > 04/14/2012  176 > 05/24/2012  165 HEENT mild turbinate edema.  Oropharynx no thrush or excess pnd or cobblestoning.  No JVD or cervical adenopathy. Mild accessory muscle hypertrophy. Trachea midline, nl thryroid. Chest was min  hyperinflated by percussion with diminished breath sounds and mild increased exp time without wheeze. Hoover sign positive at end inspiration. Regular rate and rhythm without murmur gallop or rub or increase P2 or edema.  Abd: no hsm, nl excursion. Ext warm without cyanosis or clubbing.     Assessment:          Plan:

## 2012-05-24 NOTE — Patient Instructions (Addendum)
Work on inhaler technique:  relax and gently blow all the way out then take a nice smooth deep breath back in, triggering the inhaler at same time you start breathing in.  Hold for up to 5 seconds if you can.  Rinse and gargle with water when done   If your mouth or throat starts to bother you,   I suggest you time the inhaler to your dental care and after using the inhaler(s) brush teeth and tongue with a baking soda containing toothpaste and when you rinse this out, gargle with it first to see if this helps your mouth and throat.     If you are satisfied with your treatment plan let your doctor know and he/she can either refill your medications or you can return here when your prescription runs out.     If in any way you are not 100% satisfied,  please tell us.  If 100% better, tell your friends!  

## 2012-05-24 NOTE — Assessment & Plan Note (Addendum)
-   HFA 75% 05/24/2012      - Spirometry 03/15/2012 FEV1 1.54 (74%) ratio 65    - PFT's 05/24/2012 FEV1.57 (69%) with ratio 66 and DLC0 59 corrects to 106%  GOLD II and good control of symptoms with no tendency to aecopd and relatively low risk unless resumes smoking  I reviewed the Flethcher curve with patient that basically indicates  if you quit smoking when your best day FEV1 is still well preserved(as hers is)  it is highly unlikely you will progress to severe disease and informed the patient there was no medication on the market that has proven to change the curve or the likelihood of progression.  Therefore stopping smoking and maintaining abstinence is the most important aspect of care, not choice of inhalers or for that matter, doctors.    The proper method of use, as well as anticipated side effects, of a metered-dose inhaler are discussed and demonstrated to the patient. Improved effectiveness after extensive coaching during this visit to a level of approximately   75%

## 2012-08-17 ENCOUNTER — Other Ambulatory Visit: Payer: Self-pay | Admitting: Internal Medicine

## 2012-08-17 DIAGNOSIS — Z1231 Encounter for screening mammogram for malignant neoplasm of breast: Secondary | ICD-10-CM

## 2012-09-03 ENCOUNTER — Ambulatory Visit: Payer: Self-pay

## 2012-09-07 ENCOUNTER — Encounter: Payer: Self-pay | Admitting: Internal Medicine

## 2012-09-07 ENCOUNTER — Ambulatory Visit (INDEPENDENT_AMBULATORY_CARE_PROVIDER_SITE_OTHER): Payer: Self-pay | Admitting: Internal Medicine

## 2012-09-07 VITALS — BP 132/82 | HR 64 | Temp 98.6°F | Ht 65.0 in | Wt 161.6 lb

## 2012-09-07 DIAGNOSIS — J449 Chronic obstructive pulmonary disease, unspecified: Secondary | ICD-10-CM

## 2012-09-07 MED ORDER — TRAMADOL HCL 50 MG PO TABS: 50.0000 mg | ORAL_TABLET | Freq: Four times a day (QID) | ORAL | Status: DC | PRN

## 2012-09-07 NOTE — Progress Notes (Signed)
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   The patient states that she had a course of steroids and almost immediately after completing the steroids started having difficulty with breathing and a nonproductive cough.   The patient denied ever having had PFTs in the past. She has never been under care and pulmonologist due to cost issues. rx as aecopd with component of ab and vcd and discharged 03/12/12  03/15/2012 f/u ov/Riti Rollyson post hosp f/u tapering prednisone down to 20 mg today and 10 mg 4/9 and stop. States fine when on prednisone unless extreme temperatures but even on "fine days" still wakes up and used alb neb. Minimal cough, hoarsness. rec Work on inhaler technique:       Stop symbicort Dulera 200 Take 2 puffs first thing in am and then another 2 puffs about 12 hours later.  Continue protonix 40 mg Take 30-60 min before first meal of the day  For cough use Tramadol as needed Only use your albuterol as a rescue medication(Plan B Albuterol hfa/ventolin vs Plan C nebulizer)      04/14/2012 f/u ov/Destine Zirkle cc much better breathing, still dry cough, no need for saba rescue.  Could not tol ppi due to stomach cramping > resolved off. rec Take delsym two tsp every 12 hours and supplement if needed with  tramadol 50 mg up to 1-2 every 4 hours to suppress the urge to cough.     If not improving, start Prednisone 10 mg take  4 each am x 2 days,   2 each am x 2 days,  1 each am x2days and stop Stop spiriva Add pepcid 20 mg one at bedtime Please schedule a follow up office visit in 4 weeks, sooner if needed with pft's bring all meds/inhalers/neb solutions with you    05/24/2012 f/u ov/Torrin Crihfield cc best she's felt in a while using dulera  and min need for saba daytime and never the neb,  no purulent sputum or limiting sob, no variability to symptoms. rec Work on inhaler technique:   .     If you are satisfied with your treatment plan let your doctor know and he/she can either refill your medications or you can return here when your prescription runs out.    If in any way you are not 100% satisfied,  please tell us.  If 100% better, tell your friends!   09/07/2012 f/u ov/Hero Mccathern cc still smoking and not using dulera effectively with severe coughing fits to point of almost blacking out, much worse since ran out of xanax and tramadol x one week prior to OV . No obvious daytime variabilty or assoc purulent sputum or cp or chest tightness, subjective wheeze overt sinus or hb symptoms. No unusual exp hx    Sleeping ok without nocturnal  or early am exacerbation  of respiratory  c/o's or need for noct saba. Also denies any obvious fluctuation of symptoms with weather or environmental changes or other aggravating or alleviating factors except as outlined above   ROS  The following are not active complaints unless bolded sore throat, dysphagia, dental problems, itching, sneezing,  nasal congestion or excess/ purulent secretions, ear ache,   fever, chills, sweats, unintended wt  loss, pleuritic or exertional cp, hemoptysis,  orthopnea pnd or leg swelling, presyncope, palpitations, heartburn, abdominal pain, anorexia, nausea, vomiting, diarrhea  or change in bowel or urinary habits, change in stools or urine, dysuria,hematuria,  rash, arthralgias, visual complaints, headache, numbness weakness or ataxia or problems with walking or coordination,  change in mood/affect or memory.                     Objective:   Physical Exam  obese  bf nad not hoarse Wt 172 03/15/2012 > 04/14/2012  176 > 05/24/2012  165 > 09/07/2012  161  HEENT mild turbinate edema.  Oropharynx no thrush or excess pnd or cobblestoning.  No JVD or cervical adenopathy. Mild accessory muscle hypertrophy. Trachea midline, nl  thryroid. Chest was min hyperinflated by percussion with diminished breath sounds and mild increased exp time without wheeze. Hoover sign positive at end inspiration. Regular rate and rhythm without murmur gallop or rub or increase P2 or edema.  Abd: no hsm, nl excursion. Ext warm without cyanosis or clubbing.     Assessment:          Plan:

## 2012-09-07 NOTE — Patient Instructions (Addendum)
Work on inhaler technique:  relax and gently blow all the way out then take a nice smooth deep breath back in, triggering the inhaler at same time you start breathing in.  Hold for up to 5 seconds if you can.  Rinse and gargle with water when done   If your mouth or throat starts to bother you,   I suggest you time the inhaler to your dental care and after using the inhaler(s) brush teeth and tongue with a baking soda containing toothpaste and when you rinse this out, gargle with it first to see if this helps your mouth and throat.   The key is to stop smoking completely before smoking completely stops you!      If you are satisfied with your treatment plan let your doctor know and he/she can either refill your medications or you can return here when your prescription runs out.     If in any way you are not 100% satisfied,  please tell us.  If 100% better, tell your friends!  

## 2012-09-08 NOTE — Assessment & Plan Note (Addendum)
-   HFA 75% 09/07/2012     - Spirometry 03/15/2012 FEV1 1.54 (74%) ratio 65    - PFT's 05/24/2012 FEV1.57 (69%) with ratio 66 and no better p B2 and DLC0 59  GOLD II with resumption of smoking, poor hfa and worse cough.  The proper method of use, as well as anticipated side effects, of a metered-dose inhaler are discussed and demonstrated to the patient. Improved effectiveness after extensive coaching during this visit to a level of approximately  75%  I reviewed the Flethcher curve with patient that basically indicates  if you quit smoking when your best day FEV1 is still well preserved it is highly unlikely you will progress to severe disease and informed the patient there was no medication on the market that has proven to change the curve or the likelihood of progression.  Therefore stopping smoking and maintaining abstinence is the most important aspect of care, not choice of inhalers or for that matter, doctors.    For now resume max dulera, suppress cough with tramadol and regroup in 2 weeks if not better

## 2012-09-23 ENCOUNTER — Encounter (HOSPITAL_COMMUNITY): Payer: Self-pay | Admitting: *Deleted

## 2012-09-23 ENCOUNTER — Ambulatory Visit (INDEPENDENT_AMBULATORY_CARE_PROVIDER_SITE_OTHER): Payer: Self-pay | Admitting: Adult Health

## 2012-09-23 ENCOUNTER — Emergency Department (HOSPITAL_COMMUNITY): Payer: Self-pay

## 2012-09-23 ENCOUNTER — Encounter: Payer: Self-pay | Admitting: Adult Health

## 2012-09-23 ENCOUNTER — Other Ambulatory Visit: Payer: Self-pay

## 2012-09-23 ENCOUNTER — Inpatient Hospital Stay (HOSPITAL_COMMUNITY)
Admission: EM | Admit: 2012-09-23 | Discharge: 2012-09-26 | DRG: 193 | Disposition: A | Discharge status: Home / Self Care | Payer: MEDICAID | Attending: Internal Medicine | Admitting: Internal Medicine

## 2012-09-23 VITALS — BP 102/62 | HR 62 | Temp 97.0°F | Ht 65.0 in | Wt 169.0 lb

## 2012-09-23 DIAGNOSIS — J189 Pneumonia, unspecified organism: Principal | ICD-10-CM | POA: Diagnosis present

## 2012-09-23 DIAGNOSIS — R079 Chest pain, unspecified: Secondary | ICD-10-CM

## 2012-09-23 DIAGNOSIS — J449 Chronic obstructive pulmonary disease, unspecified: Secondary | ICD-10-CM

## 2012-09-23 DIAGNOSIS — R059 Cough, unspecified: Secondary | ICD-10-CM

## 2012-09-23 DIAGNOSIS — R0789 Other chest pain: Secondary | ICD-10-CM | POA: Diagnosis present

## 2012-09-23 DIAGNOSIS — R05 Cough: Secondary | ICD-10-CM

## 2012-09-23 DIAGNOSIS — J441 Chronic obstructive pulmonary disease with (acute) exacerbation: Secondary | ICD-10-CM

## 2012-09-23 DIAGNOSIS — Z720 Tobacco use: Secondary | ICD-10-CM

## 2012-09-23 DIAGNOSIS — D72829 Elevated white blood cell count, unspecified: Secondary | ICD-10-CM | POA: Diagnosis present

## 2012-09-23 DIAGNOSIS — N179 Acute kidney failure, unspecified: Secondary | ICD-10-CM | POA: Diagnosis present

## 2012-09-23 DIAGNOSIS — J9600 Acute respiratory failure, unspecified whether with hypoxia or hypercapnia: Secondary | ICD-10-CM | POA: Diagnosis present

## 2012-09-23 DIAGNOSIS — I10 Essential (primary) hypertension: Secondary | ICD-10-CM

## 2012-09-23 LAB — CBC
Hemoglobin: 12.5 g/dL (ref 12.0–15.0)
MCH: 29.9 pg (ref 26.0–34.0)
RBC: 4.18 MIL/uL (ref 3.87–5.11)

## 2012-09-23 LAB — COMPREHENSIVE METABOLIC PANEL
ALT: 17 U/L (ref 0–35)
Alkaline Phosphatase: 91 U/L (ref 39–117)
CO2: 26 mEq/L (ref 19–32)
Calcium: 9.3 mg/dL (ref 8.4–10.5)
Chloride: 101 mEq/L (ref 96–112)
GFR calc Af Amer: 76 mL/min — ABNORMAL LOW (ref >90)
GFR calc non Af Amer: 66 mL/min — ABNORMAL LOW (ref >90)
Glucose, Bld: 80 mg/dL (ref 70–99)
Potassium: 3.9 mEq/L (ref 3.5–5.1)
Sodium: 137 mEq/L (ref 135–145)
Total Bilirubin: 0.2 mg/dL — ABNORMAL LOW (ref 0.3–1.2)

## 2012-09-23 LAB — TROPONIN I: Troponin I: <0.3 ng/mL (ref <0.30)

## 2012-09-23 MED ORDER — ALBUTEROL SULFATE (5 MG/ML) 0.5% IN NEBU
5.0000 mg | INHALATION_SOLUTION | Freq: Once | RESPIRATORY_TRACT | Status: AC
  Administered 2012-09-23: 5 mg via RESPIRATORY_TRACT
  Filled 2012-09-23: qty 1

## 2012-09-23 MED ORDER — IPRATROPIUM BROMIDE 0.02 % IN SOLN
0.5000 mg | Freq: Once | RESPIRATORY_TRACT | Status: AC
  Administered 2012-09-23: 0.5 mg via RESPIRATORY_TRACT

## 2012-09-23 MED ORDER — SODIUM CHLORIDE 0.9 % IJ SOLN
3.0000 mL | Freq: Two times a day (BID) | INTRAMUSCULAR | Status: DC
  Administered 2012-09-24 – 2012-09-25 (×2): 3 mL via INTRAVENOUS

## 2012-09-23 MED ORDER — DEXTROSE 5 % IV SOLN
500.0000 mg | INTRAVENOUS | Status: DC
  Administered 2012-09-23 – 2012-09-25 (×3): 500 mg via INTRAVENOUS
  Filled 2012-09-23 (×4): qty 500

## 2012-09-23 MED ORDER — AMLODIPINE BESYLATE 10 MG PO TABS
10.0000 mg | ORAL_TABLET | Freq: Every day | ORAL | Status: DC
  Administered 2012-09-24 – 2012-09-26 (×3): 10 mg via ORAL
  Filled 2012-09-23 (×3): qty 1

## 2012-09-23 MED ORDER — ALBUTEROL SULFATE (5 MG/ML) 0.5% IN NEBU: 5.0000 mg | INHALATION_SOLUTION | Freq: Once | RESPIRATORY_TRACT | Status: DC

## 2012-09-23 MED ORDER — IPRATROPIUM BROMIDE 0.02 % IN SOLN
0.5000 mg | Freq: Once | RESPIRATORY_TRACT | Status: AC
  Administered 2012-09-23: 0.5 mg via RESPIRATORY_TRACT
  Filled 2012-09-23: qty 2.5

## 2012-09-23 MED ORDER — GUAIFENESIN ER 600 MG PO TB12
600.0000 mg | ORAL_TABLET | Freq: Two times a day (BID) | ORAL | Status: DC
  Administered 2012-09-23 – 2012-09-26 (×7): 600 mg via ORAL
  Filled 2012-09-23 (×8): qty 1

## 2012-09-23 MED ORDER — ALBUTEROL SULFATE (5 MG/ML) 0.5% IN NEBU: 5.0000 mg | INHALATION_SOLUTION | RESPIRATORY_TRACT | Status: DC | PRN

## 2012-09-23 MED ORDER — ONDANSETRON HCL 4 MG/2ML IJ SOLN: 4.0000 mg | Freq: Four times a day (QID) | INTRAMUSCULAR | Status: DC | PRN

## 2012-09-23 MED ORDER — PREDNISONE 50 MG PO TABS
50.0000 mg | ORAL_TABLET | Freq: Every day | ORAL | Status: DC
  Administered 2012-09-24: 50 mg via ORAL
  Filled 2012-09-23 (×3): qty 1

## 2012-09-23 MED ORDER — MORPHINE SULFATE 2 MG/ML IJ SOLN: 1.0000 mg | INTRAMUSCULAR | Status: DC | PRN

## 2012-09-23 MED ORDER — TRAMADOL HCL 50 MG PO TABS
50.0000 mg | ORAL_TABLET | Freq: Four times a day (QID) | ORAL | Status: DC | PRN
  Administered 2012-09-23 – 2012-09-25 (×3): 50 mg via ORAL
  Filled 2012-09-23 (×3): qty 1

## 2012-09-23 MED ORDER — MOMETASONE FURO-FORMOTEROL FUM 200-5 MCG/ACT IN AERO: 2.0000 | INHALATION_SPRAY | Freq: Two times a day (BID) | RESPIRATORY_TRACT | Status: DC

## 2012-09-23 MED ORDER — ALBUTEROL SULFATE (5 MG/ML) 0.5% IN NEBU: 2.5000 mg | INHALATION_SOLUTION | RESPIRATORY_TRACT | Status: DC | PRN

## 2012-09-23 MED ORDER — HYDROCHLOROTHIAZIDE 25 MG PO TABS
25.0000 mg | ORAL_TABLET | Freq: Every day | ORAL | Status: DC
  Administered 2012-09-24 – 2012-09-26 (×3): 25 mg via ORAL
  Filled 2012-09-23 (×3): qty 1

## 2012-09-23 MED ORDER — SODIUM CHLORIDE 0.9 % IV SOLN
INTRAVENOUS | Status: DC
  Administered 2012-09-23: 13:00:00 via INTRAVENOUS

## 2012-09-23 MED ORDER — PANTOPRAZOLE SODIUM 40 MG PO TBEC
40.0000 mg | DELAYED_RELEASE_TABLET | Freq: Every day | ORAL | Status: DC
  Administered 2012-09-24 – 2012-09-26 (×3): 40 mg via ORAL
  Filled 2012-09-23 (×4): qty 1

## 2012-09-23 MED ORDER — METHYLPREDNISOLONE SODIUM SUCC 125 MG IJ SOLR
125.0000 mg | Freq: Once | INTRAMUSCULAR | Status: AC
  Administered 2012-09-23: 125 mg via INTRAVENOUS
  Filled 2012-09-23: qty 2

## 2012-09-23 MED ORDER — ALBUTEROL SULFATE (5 MG/ML) 0.5% IN NEBU
2.5000 mg | INHALATION_SOLUTION | Freq: Four times a day (QID) | RESPIRATORY_TRACT | Status: DC
  Administered 2012-09-23 – 2012-09-24 (×4): 2.5 mg via RESPIRATORY_TRACT
  Filled 2012-09-23 (×4): qty 0.5

## 2012-09-23 MED ORDER — FAMOTIDINE 20 MG PO TABS
20.0000 mg | ORAL_TABLET | Freq: Every evening | ORAL | Status: DC | PRN
  Administered 2012-09-24 (×2): 20 mg via ORAL
  Filled 2012-09-23 (×4): qty 1

## 2012-09-23 MED ORDER — MOMETASONE FURO-FORMOTEROL FUM 200-5 MCG/ACT IN AERO
2.0000 | INHALATION_SPRAY | Freq: Two times a day (BID) | RESPIRATORY_TRACT | Status: DC
  Administered 2012-09-23 – 2012-09-26 (×6): 2 via RESPIRATORY_TRACT
  Filled 2012-09-23: qty 0.3

## 2012-09-23 MED ORDER — ONDANSETRON HCL 4 MG PO TABS: 4.0000 mg | ORAL_TABLET | Freq: Four times a day (QID) | ORAL | Status: DC | PRN

## 2012-09-23 MED ORDER — INFLUENZA VIRUS VACC SPLIT PF IM SUSP
0.5000 mL | INTRAMUSCULAR | Status: AC
  Administered 2012-09-24: 0.5 mL via INTRAMUSCULAR
  Filled 2012-09-23: qty 0.5

## 2012-09-23 MED ORDER — MOMETASONE FURO-FORMOTEROL FUM 200-5 MCG/ACT IN AERO
2.0000 | INHALATION_SPRAY | Freq: Two times a day (BID) | RESPIRATORY_TRACT | Status: DC
  Filled 2012-09-23 (×17): qty 0.3

## 2012-09-23 NOTE — ED Notes (Signed)
[id number]<AV> Expected date:<BR> Expected time:<BR> Means of arrival:<BR> Comments:<BR> SOB, transfer from PCP

## 2012-09-23 NOTE — ED Notes (Signed)
Pt was at her PCP office and c/o SOB for the past 3 days. MD stated that the pt had an episode of altered LOC for like a couple of min. Resp distress on adm. Pt states that she has some type of heaviness in her chest and states that she has had this before and every time evaluated it has been contributed to her COPD. 324mg  ASA given by EMS. Pt denies pain at this time.

## 2012-09-23 NOTE — Assessment & Plan Note (Signed)
Acute chest pain w/ respiratory distress ? Etiology  Pt will be transported to ER for further  evaluation and treatment.  IV was placed- 20 gauge in right wrist w/ NS at [facility]  O2 placed w/ O2 sat at 100% .  EKG w/ SB , inverted t waves  EMS at office w/ transport to [facility] ER.  Dr. Sherene Sires  Aware and w/ pt exam.

## 2012-09-23 NOTE — Patient Instructions (Signed)
Transfer to ER via EMS  

## 2012-09-23 NOTE — Progress Notes (Signed)
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   The patient states that she had a course of steroids and almost immediately after completing the steroids started having difficulty with breathing and a nonproductive cough.   The patient denied ever having had PFTs in the past. She has never been under care and pulmonologist due to cost issues. rx as aecopd with component of ab and vcd and discharged 03/12/12  03/15/2012 f/u ov/Wert post hosp f/u tapering prednisone down to 20 mg today and 10 mg 4/9 and stop. States fine when on prednisone unless extreme temperatures but even on "fine days" still wakes up and used alb neb. Minimal cough, hoarsness. rec Work on inhaler technique:       Stop symbicort Dulera 200 Take 2 puffs first thing in am and then another 2 puffs about 12 hours later.  Continue protonix 40 mg Take 30-60 min before first meal of the day  For cough use Tramadol as needed Only use your albuterol as a rescue medication(Plan B Albuterol hfa/ventolin vs Plan C nebulizer)      04/14/2012 f/u ov/Wert cc much better breathing, still dry cough, no need for saba rescue.  Could not tol ppi due to stomach cramping > resolved off. rec Take delsym two tsp every 12 hours and supplement if needed with  tramadol 50 mg up to 1-2 every 4 hours to suppress the urge to cough.     If not improving, start Prednisone 10 mg take  4 each am x 2 days,   2 each am x 2 days,  1 each am x2days and stop Stop spiriva Add pepcid 20 mg one at bedtime Please schedule a follow up office visit in 4 weeks, sooner if needed with pft's bring all meds/inhalers/neb solutions with you    05/24/2012 f/u ov/Wert cc best she's felt in a while using dulera  and min need for saba daytime and never the neb,  no purulent sputum or limiting sob, no variability to symptoms. rec Work on inhaler technique:   .     If you are satisfied with your treatment plan let your doctor know and he/she can either refill your medications or you can return here when your prescription runs out.    If in any way you are not 100% satisfied,  please tell us.  If 100% better, tell your friends!   09/07/2012 f/u ov/Wert cc still smoking and not using dulera effectively with severe coughing fits to point of almost blacking out, much worse since ran out of xanax and tramadol x one week prior to OV . No obvious daytime variabilty or assoc purulent sputum or cp or chest tightness, subjective wheeze overt sinus or hb symptoms. No unusual exp hx       >no changes   09/23/2012 Acute OV  Complains of c/o increased sob x 3 days,  Last cigarette was 09/07/12 , using nicotine patch .  Got better then cough restarted 3 days ago.  Feels severe chest tightness "elephant sitting on chest " .  +orthopnea, has had to sleep in chair for 3 nights to breathe.  No edema.  During beginning of exam pt w/ presyncopal episode with severe  dyspnea and lethargy  Complained of severe chest- mid sternal chest pain  She was placed on oxygen and IV started-IVF at [facility] .  EMS called for emergency transport.  No loss of pulse or respiration.  Maintained sat of 100%         ROS:  Constitutional:   No  weight loss, night sweats,  Fevers, chills,  +fatigue, or  lassitude.  HEENT:   No headaches,  Difficulty swallowing,  Tooth/dental problems, or  Sore throat,                No sneezing, itching, ear ache, nasal congestion, post nasal drip,   CV:  No swelling in lower extremities, anasarca, dizziness, palpitation  GI  No heartburn, indigestion, abdominal pain, nausea, vomiting, diarrhea, change in bowel habits, loss of appetite, bloody stools.   Resp:    No coughing up of blood.     No chest wall deformity  Skin: no rash or lesions.  GU: no  dysuria, change in color of urine, + urgency or frequency.  No flank pain, no hematuria   MS:  No joint pain or swelling.  No decreased range of motion.  No back pain.  Psych:  No change in mood or affect. No depression or anxiety.  No memory loss.                Objective:   Physical Exam  obese  Bf, resp distress, lethargic  Wt 172 03/15/2012 > 04/14/2012  176 > 05/24/2012  165 > 09/07/2012  161 >169 09/23/2012   GEN: A/Ox3; lethargic   HEENT:  Powers/AT,  EACs-clear, TMs-wnl, NOSE-clear, THROAT-clear, no lesions, no postnasal drip or exudate noted.   NECK:  Supple w/ fair ROM; no JVD; normal carotid impulses w/o bruits; no thyromegaly or nodules palpated; no lymphadenopathy.  RESP  Coarse BS , w/o, wheezes/ rales/ or rhonchi.no accessory muscle use, no dullness to percussion  CARD:  SB  no m/r/g  , no peripheral edema, pulses intact, no cyanosis or clubbing.  GI:   Soft & nt; nml bowel sounds; no organomegaly or masses detected.  Musco: Warm bil, no deformities or joint swelling noted.   Neuro: alert, no focal deficits noted.    Skin: Warm, no lesions or rashes      EKG : SB, HR 54 , inverted t waves    Assessment:          Plan:

## 2012-09-23 NOTE — H&P (Signed)
[facility] History and Physical  Sandhya Denherder ZOX:[id number] DOB: 1951-08-05 DOA: 09/23/2012  Referring physician: ED physician PCP: Burtis Junes, MD   Chief Complaint: Shortness of breath  HPI:  Pt is 61 yo female who was sent from Dr. Sherene Sires office for further evaluation and management of new onset shortness of breath. Pt reports history of COPD and her shortness of breath has been getting progressively worse over the past few days, associated with mid area chest pain, intermittent in nature and non radiating, 7/10 in severity when present, aggravated with deep breath and cough which is productive of yellow sputum and with no specific alleviating factors. Pt denies any fevers, chills, other systemic symptoms, no similar events in the past. She denies any abdominal or urinary concerns, no recent sick contacts or exposures.   Assessment and Plan: Shortness of breath - based on symptoms and clinical exam findings this is mostly consistent with COPD exacerbation - will admit the pt to telemetry floor - will provided nebulizers PRN and scheduled - monitor vitals per protocol - empiric antibiotics for now - prednisone taper will be ordered  Chest pain - likely musculoskeletal etiology - will monitor on telemetry - check troponin level and cycle every 8 hours - 12 lead EKG - check TSH  Code Status: Full Family Communication: Pt at bedside Disposition Plan: Admit for observation to telemetry floor   Review of Systems:  Constitutional: Negative for fever, chills and malaise/fatigue. Negative for diaphoresis.  HENT: Negative for hearing loss, ear pain, nosebleeds, congestion, sore throat, neck pain, tinnitus and ear discharge.   Eyes: Negative for blurred vision, double vision, photophobia, pain, discharge and redness.  Respiratory: Negative for hemoptysis, positive for shortness of breath, wheezing, productive cough   Cardiovascular: Positive for chest pain, negative  for palpitations, orthopnea, claudication and leg swelling.  Gastrointestinal: Negative for nausea, vomiting and abdominal pain. Negative for heartburn, constipation, blood in stool and melena.  Genitourinary: Negative for dysuria, urgency, frequency, hematuria and flank pain.  Musculoskeletal: Negative for myalgias, back pain, joint pain and falls.  Skin: Negative for itching and rash.  Neurological: Negative for dizziness and weakness. Negative for tingling, tremors, sensory change, speech change, focal weakness, loss of consciousness and headaches.  Endo/Heme/Allergies: Negative for environmental allergies and polydipsia. Does not bruise/bleed easily.  Psychiatric/Behavioral: Negative for suicidal ideas. The patient is not nervous/anxious.      Past Medical History  Diagnosis Date  . Hypertension   . COPD (chronic obstructive pulmonary disease)   . Asthma   . Arthritis     right knee, neck  . Mouth trouble     infection from tooth that was pulled approx. mid Mar. 2013    Past Surgical History  Procedure Date  . Abdominal hysterectomy   . Tonsillectomy age 73  . Tubal ligation   . Shoulder surgery in age 17's    cyst removed from Bursa socket of left shoulder    Social History:  reports that she quit smoking about 10 months ago. Her smoking use included Cigarettes. She has a 45 pack-year smoking history. She has never used smokeless tobacco. She reports that she does not drink alcohol or use illicit drugs.  Allergies  Allergen Reactions  . Nsaids Shortness Of Breath  . Clonidine Derivatives     Coughing shortness of breath  . Codeine Other (See Comments)    Reaction: hallucination   . Lisinopril Hives  . Penicillins Hives  . Vicodin (Hydrocodone-Acetaminophen) Nausea And Vomiting  . Procaine  Rash    Family History  Problem Relation Age of Onset  . Dementia Mother     Prior to Admission medications   Medication Sig Start Date End Date Taking? Authorizing Provider    albuterol (PROVENTIL HFA;VENTOLIN HFA) 108 (90 BASE) MCG/ACT inhaler Inhale 2 puffs into the lungs every 6 (six) hours as needed. For shortness of breath   Yes Historical Provider, MD  albuterol (PROVENTIL) (2.5 MG/3ML) 0.083% nebulizer solution Take 2.5 mg by nebulization every 6 (six) hours as needed. Breathing   Yes Historical Provider, MD  amLODipine (NORVASC) 10 MG tablet Take 10 mg by mouth daily.    Yes Historical Provider, MD  dextromethorphan (DELSYM) 30 MG/5ML liquid Take 60 mg by mouth as needed.    Yes Historical Provider, MD  famotidine (PEPCID) 20 MG tablet Take 20 mg by mouth at bedtime as needed. One at bedtime 04/14/12 04/14/13 Yes Nyoka Cowden, MD  hydrochlorothiazide (HYDRODIURIL) 25 MG tablet Take 25 mg by mouth daily.    Yes Historical Provider, MD  Mometasone Furo-Formoterol Fum 200-5 MCG/ACT AERO Place 2 puffs into the nose 2 (two) times daily. Take 2 puffs first thing in am and then another 2 puffs about 12 hours later. 03/15/12  Yes Nyoka Cowden, MD  pantoprazole (PROTONIX) 40 MG tablet Take 40 mg by mouth daily before breakfast. 03/12/12 03/12/13 Yes Altha Harm, MD  traMADol (ULTRAM) 50 MG tablet Take 1 tablet (50 mg total) by mouth every 6 (six) hours as needed for pain. 1 every 6 hours as needed 09/07/12 09/07/13 Yes Nyoka Cowden, MD    Physical Exam: Filed Vitals:   09/23/12 1153 09/23/12 1208 09/23/12 1340  BP:  107/44   Pulse:  54   Temp:  98.3 F (36.8 C)   TempSrc:  Oral   Resp:  28   SpO2: 100% 92% 94%    Physical Exam  Constitutional: Appears well-developed and well-nourished. No distress.  HENT: Normocephalic. External right and left ear normal. Oropharynx is clear and moist.  Eyes: Conjunctivae and EOM are normal. PERRLA, no scleral icterus.  Neck: Normal ROM. Neck supple. No JVD. No tracheal deviation. No thyromegaly.  CVS: RRR, S1/S2 +, no murmurs, no gallops, no carotid bruit.  Pulmonary: Effort and breath sounds normal, end expiratory  wheezing Abdominal: Soft. BS +,  no distension, tenderness, rebound or guarding.  Musculoskeletal: Normal range of motion. No edema and no tenderness.  Lymphadenopathy: No lymphadenopathy noted, cervical, inguinal. Neuro: Alert. Normal reflexes, muscle tone coordination. No cranial nerve deficit. Skin: Skin is warm and dry. No rash noted. Not diaphoretic. No erythema. No pallor.  Psychiatric: Normal mood and affect. Behavior, judgment, thought content normal.   Labs on Admission:  Basic Metabolic Panel:  Lab 09/23/12 1610  NA 137  K 3.9  CL 101  CO2 26  GLUCOSE 80  BUN 10  CREATININE 0.91  CALCIUM 9.3  MG --  PHOS --   Liver Function Tests:  Lab 09/23/12 1245  AST 21  ALT 17  ALKPHOS 91  BILITOT 0.2*  PROT 7.5  ALBUMIN 3.8   No results found for this basename: LIPASE:5,AMYLASE:5 in the last 168 hours No results found for this basename: AMMONIA:5 in the last 168 hours CBC:  Lab 09/23/12 1245  WBC 7.3  NEUTROABS --  HGB 12.5  HCT 38.0  MCV 90.9  PLT 168   Cardiac Enzymes:  Lab 09/23/12 1245  CKTOTAL --  CKMB --  CKMBINDEX --  TROPONINI <  0.30   BNP: No components found with this basename: POCBNP:5 CBG: No results found for this basename: GLUCAP:5 in the last 168 hours  Radiological Exams on Admission: Dg Chest 2 View  09/23/2012  *RADIOLOGY REPORT*  Clinical Data: COPD, asthma, shortness of breath, cough, chest discomfort, hypertension, smoker  CHEST - 2 VIEW  Comparison: 03/10/2012  Findings: Enlargement of cardiac silhouette. Tortuous aorta. Pulmonary vascularity normal. Lungs appear mildly emphysematous but clear. No acute infiltrate, pleural effusion or pneumothorax. Bones demineralized. Thoracolumbar biconvex scoliosis.  IMPRESSION: Enlargement of cardiac silhouette. COPD changes. No acute abnormalities.   Original Report Authenticated By: Lollie Marrow, M.D.     EKG: Normal sinus rhythm, no ST/T wave changes  Debbora Presto, MD  [facility] Pager [phone]  If 7PM-7AM, please contact night-coverage [email or web address] Password [email or web address] 09/23/2012, 2:59 PM

## 2012-09-23 NOTE — ED Notes (Signed)
Patient's EKG was given to Dr Darrin Luis.

## 2012-09-23 NOTE — Addendum Note (Signed)
Addended by: Charlott Holler on: 09/23/2012 11:43 AM   Modules accepted: Orders

## 2012-09-23 NOTE — ED Provider Notes (Signed)
History     CSN: [id number]  Arrival date & time 09/23/12  1148   First MD Initiated Contact with Patient 09/23/12 1216      Chief Complaint  Patient presents with  . Shortness of Breath    (Consider location/radiation/quality/duration/timing/severity/associated sxs/prior treatment) Patient is a 61 y.o. female presenting with shortness of breath. The history is provided by the patient.  Shortness of Breath  Associated symptoms include shortness of breath. Pertinent negatives include no chest pain and no fever.  pt w hx copd, presents from pulmonary clinic where she was seen today w c/o worsening sob x 3 days. Using albuterol regular, states compliant w other meds, but increased wob. Non productive cough. States has felt tight in chest for past few days, constant. Denies episodic or exertional cp or discomfort. No assoc nv or diaphoresis. No pleuritic pain. No leg pain or swelling. No dvt or pe hx. No personal or family hx cad. Denies orthopnea or pnd, no hx chf. Denies productive cough, sore throat or fever/chills. No recent change in meds.      Past Medical History  Diagnosis Date  . Hypertension   . COPD (chronic obstructive pulmonary disease)   . Asthma   . Arthritis     right knee, neck  . Mouth trouble     infection from tooth that was pulled approx. mid Mar. 2013    Past Surgical History  Procedure Date  . Abdominal hysterectomy   . Tonsillectomy age 17  . Tubal ligation   . Shoulder surgery in age 31's    cyst removed from Bursa socket of left shoulder    Family History  Problem Relation Age of Onset  . Dementia Mother     History  Substance Use Topics  . Smoking status: Former Smoker -- 1.0 packs/day for 45 years    Types: Cigarettes    Quit date: 10/27/2011  . Smokeless tobacco: Never Used  . Alcohol Use: No    OB History    Grav Para Term Preterm Abortions TAB SAB Ect Mult Living                  Review of Systems  Constitutional: Negative  for fever and chills.  HENT: Negative for neck pain.   Eyes: Negative for redness.  Respiratory: Positive for shortness of breath.   Cardiovascular: Negative for chest pain, palpitations and leg swelling.  Gastrointestinal: Negative for abdominal pain.  Genitourinary: Negative for flank pain.  Musculoskeletal: Negative for back pain.  Skin: Negative for rash.  Neurological: Negative for headaches.  Hematological: Does not bruise/bleed easily.  Psychiatric/Behavioral: Negative for confusion.    Allergies  Nsaids; Clonidine derivatives; Codeine; Lisinopril; Penicillins; Vicodin; and Procaine  Home Medications   Current Outpatient Rx  Name Route Sig Dispense Refill  . ALBUTEROL SULFATE HFA 108 (90 BASE) MCG/ACT IN AERS Inhalation Inhale 2 puffs into the lungs every 6 (six) hours as needed. For shortness of breath      . ALBUTEROL SULFATE (2.5 MG/3ML) 0.083% IN NEBU Nebulization Take 2.5 mg by nebulization every 6 (six) hours as needed.    Marland Kitchen AMLODIPINE BESYLATE 10 MG PO TABS Oral Take 10 mg by mouth daily.      Marland Kitchen DEXTROMETHORPHAN POLISTIREX ER 30 MG/5ML PO LQCR Oral Take 60 mg by mouth as needed.    Marland Kitchen FAMOTIDINE 20 MG PO TABS  One at bedtime 30 tablet 11  . HYDROCHLOROTHIAZIDE 25 MG PO TABS Oral Take 25 mg by mouth  daily.     . MOMETASONE FURO-FORMOTEROL FUM 200-5 MCG/ACT IN AERO  Take 2 puffs first thing in am and then another 2 puffs about 12 hours later.    Marland Kitchen PANTOPRAZOLE SODIUM 40 MG PO TBEC Oral Take 40 mg by mouth daily before breakfast.    . TRAMADOL HCL 50 MG PO TABS Oral Take 1 tablet (50 mg total) by mouth every 6 (six) hours as needed for pain. 1 every 6 hours as needed 30 tablet 0    BP 107/44  Pulse 54  Temp 98.3 F (36.8 C) (Oral)  Resp 28  SpO2 92%  Physical Exam  Nursing note and vitals reviewed. Constitutional: She is oriented to person, place, and time. She appears well-developed and well-nourished. No distress.  HENT:  Mouth/Throat: Oropharynx is clear and  moist.  Eyes: Conjunctivae normal are normal. No scleral icterus.  Neck: Neck supple. No tracheal deviation present.  Cardiovascular: Regular rhythm, normal heart sounds and intact distal pulses.  Exam reveals no gallop and no friction rub.   No murmur heard. Pulmonary/Chest: Effort normal. No respiratory distress. She has wheezes.  Abdominal: Soft. Normal appearance and bowel sounds are normal. She exhibits no distension. There is no tenderness.  Musculoskeletal: She exhibits no edema and no tenderness.  Neurological: She is alert and oriented to person, place, and time.  Skin: Skin is warm and dry. No rash noted.  Psychiatric: She has a normal mood and affect.    ED Course  Procedures (including critical care time)   Labs Reviewed  CBC  COMPREHENSIVE METABOLIC PANEL  TROPONIN I  PRO B NATRIURETIC PEPTIDE   Results for orders placed during the hospital encounter of 09/23/12  CBC      Component Value Range   WBC 7.3  4.0 - 10.5 K/uL   RBC 4.18  3.87 - 5.11 MIL/uL   Hemoglobin 12.5  12.0 - 15.0 g/dL   HCT 16.1  09.6 - 04.5 %   MCV 90.9  78.0 - 100.0 fL   MCH 29.9  26.0 - 34.0 pg   MCHC 32.9  30.0 - 36.0 g/dL   RDW 40.9  81.1 - 91.4 %   Platelets 168  150 - 400 K/uL  COMPREHENSIVE METABOLIC PANEL      Component Value Range   Sodium 137  135 - 145 mEq/L   Potassium 3.9  3.5 - 5.1 mEq/L   Chloride 101  96 - 112 mEq/L   CO2 26  19 - 32 mEq/L   Glucose, Bld 80  70 - 99 mg/dL   BUN 10  6 - 23 mg/dL   Creatinine, Ser 7.82  0.50 - 1.10 mg/dL   Calcium 9.3  8.4 - 95.6 mg/dL   Total Protein 7.5  6.0 - 8.3 g/dL   Albumin 3.8  3.5 - 5.2 g/dL   AST 21  0 - 37 U/L   ALT 17  0 - 35 U/L   Alkaline Phosphatase 91  39 - 117 U/L   Total Bilirubin 0.2 (*) 0.3 - 1.2 mg/dL   GFR calc non Af Amer 66 (*) >90 mL/min   GFR calc Af Amer 76 (*) >90 mL/min  TROPONIN I      Component Value Range   Troponin I <0.30  <0.30 ng/mL  PRO B NATRIURETIC PEPTIDE      Component Value Range   Pro B  Natriuretic peptide (BNP) 354.6 (*) 0 - 125 pg/mL   Dg Chest 2 View  09/23/2012  *  RADIOLOGY REPORT*  Clinical Data: COPD, asthma, shortness of breath, cough, chest discomfort, hypertension, smoker  CHEST - 2 VIEW  Comparison: 03/10/2012  Findings: Enlargement of cardiac silhouette. Tortuous aorta. Pulmonary vascularity normal. Lungs appear mildly emphysematous but clear. No acute infiltrate, pleural effusion or pneumothorax. Bones demineralized. Thoracolumbar biconvex scoliosis.  IMPRESSION: Enlargement of cardiac silhouette. COPD changes. No acute abnormalities.   Original Report Authenticated By: Lollie Marrow, M.D.       MDM  Iv ns. Labs. Monitor. Ecg.  Albuterol and atrovent neb. Solumedrol iv.  Reviewed nursing notes and prior charts for additional history.    Date: 09/23/2012  Rate: 52  Rhythm: sinus bradycardia  QRS Axis: normal  Intervals: normal  ST/T Wave abnormalities: nonspecific ST/T changes  Conduction Disutrbances:lvh  Narrative Interpretation:   Old EKG Reviewed: unchanged t inv inf/lat noted on prior ecg   Recheck persistent wheeze/chest tightness, albuterol and atrovent neb.     After nebs, solumedrol, persistent dyspnea, although improved from prior, improved air exchange +wheeze.   [facility] called for admit re chest pain/tightness and copd, they state obs, tele, team 4, requests 4e/w.     Suzi Roots, MD 09/23/12 [phone]

## 2012-09-24 LAB — BASIC METABOLIC PANEL
CO2: 24 mEq/L (ref 19–32)
Calcium: 9 mg/dL (ref 8.4–10.5)
Chloride: 100 mEq/L (ref 96–112)
Creatinine, Ser: 1.21 mg/dL — ABNORMAL HIGH (ref 0.50–1.10)
GFR calc Af Amer: 54 mL/min — ABNORMAL LOW (ref >90)
Sodium: 135 mEq/L (ref 135–145)

## 2012-09-24 LAB — CBC
HCT: 36.3 % (ref 36.0–46.0)
MCV: 91.2 fL (ref 78.0–100.0)
Platelets: 154 10*3/uL (ref 150–400)
RBC: 3.98 MIL/uL (ref 3.87–5.11)
RDW: 13.9 % (ref 11.5–15.5)
WBC: 10.8 10*3/uL — ABNORMAL HIGH (ref 4.0–10.5)

## 2012-09-24 LAB — TROPONIN I: Troponin I: <0.3 ng/mL (ref <0.30)

## 2012-09-24 MED ORDER — SODIUM CHLORIDE 0.9 % IV SOLN
INTRAVENOUS | Status: AC
  Administered 2012-09-24: 10:00:00 via INTRAVENOUS

## 2012-09-24 MED ORDER — IPRATROPIUM BROMIDE 0.02 % IN SOLN
0.5000 mg | RESPIRATORY_TRACT | Status: DC | PRN
  Administered 2012-09-24 – 2012-09-25 (×3): 0.5 mg via RESPIRATORY_TRACT
  Filled 2012-09-24 (×4): qty 2.5

## 2012-09-24 MED ORDER — ALBUTEROL SULFATE (5 MG/ML) 0.5% IN NEBU
2.5000 mg | INHALATION_SOLUTION | RESPIRATORY_TRACT | Status: DC | PRN
  Administered 2012-09-24 – 2012-09-25 (×3): 2.5 mg via RESPIRATORY_TRACT
  Filled 2012-09-24 (×3): qty 0.5

## 2012-09-24 MED ORDER — PREDNISONE 20 MG PO TABS
40.0000 mg | ORAL_TABLET | Freq: Every day | ORAL | Status: DC
  Administered 2012-09-25: 40 mg via ORAL
  Filled 2012-09-24 (×2): qty 2

## 2012-09-24 NOTE — Progress Notes (Signed)
Patient ID: Barbara [name], female   DOB: 1951/10/09, 61 y.o.   MRN: [id number]  [facility] PROGRESS NOTE  Barbara [name] [id number] DOB: 1951/11/06 DOA: 09/23/2012 PCP: Burtis Junes, MD  Brief narrative: HPI:  Pt is 61 yo female who was sent from Dr. Sherene Sires office for further evaluation and management of new onset shortness of breath. Pt reports history of COPD and her shortness of breath has been getting progressively worse over the past few days, associated with mid area chest pain, intermittent in nature and non radiating, 7/10 in severity when present, aggravated with deep breath and cough which is productive of yellow sputum and with no specific alleviating factors. Pt denies any fevers, chills, other systemic symptoms, no similar events in the past. She denies any abdominal or urinary concerns, no recent sick contacts or exposures.   Assessment and Plan:  Shortness of breath  - based on symptoms and clinical exam findings this is mostly consistent with acute on chronic COPD exacerbation  - will continue to provide nebulizers PRN - monitor vitals per protocol  - empiric antibiotic for now, Zithromax day #2 - prednisone taper   Chest pain  - likely musculoskeletal etiology  - will d/c telemetry - troponin x 3 within normal limits - check TSH   Leukocytosis - likely form steroid use - will continue to taper down - CBC in AM  Acute renal failure - unclear etiology - will provide gentle hydration - BMP in AM Strict I's and O's  Code Status: Full  Family Communication: Pt at bedside  Disposition Plan: D/C telemetry   Consultants:  None  Procedures/Studies: Dg Chest 2 View 09/23/2012   Enlargement of cardiac silhouette. COPD changes. No acute abnormalities.      Antibiotics:  Zithromax 09/23/2012 -->  Code Status: Full Family Communication: Pt at bedside Disposition Plan: Home when medically stable  HPI/Subjective: No events overnight.    Objective: Filed Vitals:   09/23/12 2125 09/24/12 0141 09/24/12 0529 09/24/12 0908  BP: 102/57  125/71   Pulse: 74  62   Temp: 97.6 F (36.4 C)  98 F (36.7 C)   TempSrc: Oral  Oral   Resp: 18  18   Height:      Weight:      SpO2: 100% 97% 99% 98%   No intake or output data in the 24 hours ending 09/24/12 9528  Exam:   General:  Pt is alert, follows commands appropriately, not in acute distress  Cardiovascular: Regular rate and rhythm, S1/S2, no murmurs, no rubs, no gallops  Respiratory: Clear to auscultation bilaterally, no wheezing, no crackles, no rhonchi  Abdomen: Soft, non tender, non distended, bowel sounds present, no guarding  Extremities: No edema, pulses DP and PT palpable bilaterally  Neuro: Grossly nonfocal  Data Reviewed: Basic Metabolic Panel:  Lab 09/24/12 4132 09/23/12 1245  NA 135 137  K 4.2 3.9  CL 100 101  CO2 24 26  GLUCOSE 150* 80  BUN 18 10  CREATININE 1.21* 0.91  CALCIUM 9.0 9.3  MG -- --  PHOS -- --   Liver Function Tests:  Lab 09/23/12 1245  AST 21  ALT 17  ALKPHOS 91  BILITOT 0.2*  PROT 7.5  ALBUMIN 3.8   CBC:  Lab 09/24/12 0310 09/23/12 1245  WBC 10.8* 7.3  NEUTROABS -- --  HGB 12.0 12.5  HCT 36.3 38.0  MCV 91.2 90.9  PLT 154 168   Cardiac Enzymes:  Lab 09/24/12 0310 09/23/12 2132 09/23/12 1529  09/23/12 1245  CKTOTAL -- -- -- --  CKMB -- -- -- --  CKMBINDEX -- -- -- --  TROPONINI <0.30 <0.30 <0.30 <0.30   Scheduled Meds:   . albuterol  2.5 mg Nebulization Q6H  . amLODipine  10 mg Oral Daily  . azithromycin  500 mg Intravenous Q24H  . guaiFENesin  600 mg Oral BID  . hydrochlorothiazide  25 mg Oral Daily  . ipratropium  0.5 mg Nebulization Once  . ipratropium  0.5 mg Nebulization Once  . Mometasone   2 puff Inhalation BID  . pantoprazole  40 mg Oral QAC breakfast  . predniSONE  50 mg Oral Q breakfast   Continuous Infusions:   . DISCONTD: sodium chloride 10 mL/hr at 09/23/12 1247     Barbara Presto, MD  [facility] Pager [phone]  If 7PM-7AM, please contact night-coverage [email or web address] Password [email or web address] 09/24/2012, 9:22 AM   LOS: 1 day

## 2012-09-25 LAB — CBC
HCT: 34.9 % — ABNORMAL LOW (ref 36.0–46.0)
Hemoglobin: 11.5 g/dL — ABNORMAL LOW (ref 12.0–15.0)
RBC: 3.83 MIL/uL — ABNORMAL LOW (ref 3.87–5.11)

## 2012-09-25 LAB — BASIC METABOLIC PANEL
BUN: 18 mg/dL (ref 6–23)
CO2: 26 mEq/L (ref 19–32)
Glucose, Bld: 112 mg/dL — ABNORMAL HIGH (ref 70–99)
Potassium: 3.8 mEq/L (ref 3.5–5.1)
Sodium: 137 mEq/L (ref 135–145)

## 2012-09-25 MED ORDER — PREDNISONE 20 MG PO TABS
30.0000 mg | ORAL_TABLET | Freq: Every day | ORAL | Status: DC
  Administered 2012-09-26: 30 mg via ORAL
  Filled 2012-09-25 (×2): qty 1

## 2012-09-25 MED ORDER — IPRATROPIUM BROMIDE 0.02 % IN SOLN
0.5000 mg | Freq: Three times a day (TID) | RESPIRATORY_TRACT | Status: DC
  Administered 2012-09-25 – 2012-09-26 (×2): 0.5 mg via RESPIRATORY_TRACT
  Filled 2012-09-25: qty 2.5

## 2012-09-25 MED ORDER — ALBUTEROL SULFATE (5 MG/ML) 0.5% IN NEBU
2.5000 mg | INHALATION_SOLUTION | Freq: Three times a day (TID) | RESPIRATORY_TRACT | Status: DC
  Administered 2012-09-25 – 2012-09-26 (×2): 2.5 mg via RESPIRATORY_TRACT
  Filled 2012-09-25 (×2): qty 0.5

## 2012-09-25 NOTE — Progress Notes (Signed)
Patient ID: Barbara [name], female   DOB: 1951/08/11, 61 y.o.   MRN: [id number]  [facility] PROGRESS NOTE  Ife Vitelli [id number] DOB: [birth year] DOA: 09/23/2012 PCP: Burtis Junes, MD  Brief narrative:  HPI:  Pt is 61 yo female who was sent from Dr. Sherene Sires office for further evaluation and management of new onset shortness of breath. Pt reports history of COPD and her shortness of breath has been getting progressively worse over the past few days, associated with mid area chest pain, intermittent in nature and non radiating, 7/10 in severity when present, aggravated with deep breath and cough which is productive of yellow sputum and with no specific alleviating factors. Pt denies any fevers, chills, other systemic symptoms, no similar events in the past. She denies any abdominal or urinary concerns, no recent sick contacts or exposures.   Assessment and Plan:  Shortness of breath  - based on symptoms and clinical exam findings this is mostly consistent with acute on chronic COPD exacerbation  - will continue to provide nebulizers PRN  - monitor vitals per protocol  - empiric antibiotic for now, Zithromax day #3 - prednisone taper   Chest pain  - likely musculoskeletal etiology  - will d/c telemetry  - troponin x 3 within normal limits  - TSH is within normal limits   Leukocytosis  - likely form steroid use  - will continue to taper down  - CBC in AM   Acute renal failure  - unclear etiology  - now resolved with IVF - BMP in AM  - Strict I's and O's   Code Status: Full  Family Communication: Pt at bedside  Disposition Plan: D/C in AM  Consultants:  None  Procedures/Studies:  Dg Chest 2 View  09/23/2012  Enlargement of cardiac silhouette. COPD changes. No acute abnormalities.   Antibiotics:  Zithromax 09/23/2012 -->  HPI/Subjective: No events overnight.   Objective: Filed Vitals:   09/25/12 0620 09/25/12 0738 09/25/12 1100 09/25/12 1353  BP:  118/72   106/69  Pulse: 54   56  Temp: 97.9 F (36.6 C)   97.9 F (36.6 C)  TempSrc: Oral   Oral  Resp: 20   20  Height:      Weight:      SpO2: 98% 97% 99% 99%    Intake/Output Summary (Last 24 hours) at 09/25/12 1656 Last data filed at 09/25/12 1300  Gross per 24 hour  Intake    480 ml  Output   1600 ml  Net  -1120 ml    Exam:   General:  Pt is alert, follows commands appropriately, not in acute distress  Cardiovascular: Regular rate and rhythm, S1/S2, no murmurs, no rubs, no gallops  Respiratory: Clear to auscultation bilaterally, no wheezing, no crackles, no rhonchi  Abdomen: Soft, non tender, non distended, bowel sounds present, no guarding  Extremities: No edema, pulses DP and PT palpable bilaterally  Neuro: Grossly nonfocal  Data Reviewed: Basic Metabolic Panel:  Lab 09/25/12 9562 09/24/12 0310 09/23/12 1245  NA 137 135 137  K 3.8 4.2 3.9  CL 102 100 101  CO2 26 24 26   GLUCOSE 112* 150* 80  BUN 18 18 10   CREATININE 0.88 1.21* 0.91  CALCIUM 8.8 9.0 9.3  MG -- -- --  PHOS -- -- --   Liver Function Tests:  Lab 09/23/12 1245  AST 21  ALT 17  ALKPHOS 91  BILITOT 0.2*  PROT 7.5  ALBUMIN 3.8   CBC:  Lab 09/25/12  1610 09/24/12 0310 09/23/12 1245  WBC 14.3* 10.8* 7.3  NEUTROABS -- -- --  HGB 11.5* 12.0 12.5  HCT 34.9* 36.3 38.0  MCV 91.1 91.2 90.9  PLT 166 154 168   Cardiac Enzymes:  Lab 09/24/12 0310 09/23/12 2132 09/23/12 1529 09/23/12 1245  CKTOTAL -- -- -- --  CKMB -- -- -- --  CKMBINDEX -- -- -- --  TROPONINI <0.30 <0.30 <0.30 <0.30     Scheduled Meds:   . ipratropium  0.5 mg Nebulization TID   And  . albuterol  2.5 mg Nebulization TID  . amLODipine  10 mg Oral Daily  . azithromycin  500 mg Intravenous Q24H  . guaiFENesin  600 mg Oral BID  . hydrochlorothiazide  25 mg Oral Daily  . Mometasone Furo-Formoterol Fum  2 puff Inhalation BID  . pantoprazole  40 mg Oral QAC breakfast  . predniSONE  40 mg Oral Q breakfast  . sodium  chloride  3 mL Intravenous Q12H   Continuous Infusions:   . sodium chloride 100 mL/hr at 09/24/12 1015     Debbora Presto, MD  [facility] Pager [phone]  If 7PM-7AM, please contact night-coverage [email or web address] Password [email or web address] 09/25/2012, 4:56 PM   LOS: 2 days

## 2012-09-26 DIAGNOSIS — I10 Essential (primary) hypertension: Secondary | ICD-10-CM

## 2012-09-26 LAB — CBC
HCT: 36 % (ref 36.0–46.0)
Hemoglobin: 11.8 g/dL — ABNORMAL LOW (ref 12.0–15.0)
MCH: 29.9 pg (ref 26.0–34.0)
MCHC: 32.8 g/dL (ref 30.0–36.0)
MCV: 91.1 fL (ref 78.0–100.0)
RBC: 3.95 MIL/uL (ref 3.87–5.11)

## 2012-09-26 LAB — BASIC METABOLIC PANEL
BUN: 15 mg/dL (ref 6–23)
CO2: 29 mEq/L (ref 19–32)
Calcium: 9 mg/dL (ref 8.4–10.5)
Creatinine, Ser: 0.95 mg/dL (ref 0.50–1.10)
Glucose, Bld: 90 mg/dL (ref 70–99)
Sodium: 139 mEq/L (ref 135–145)

## 2012-09-26 MED ORDER — PREDNISONE 10 MG PO TABS: ORAL_TABLET | ORAL | Status: DC

## 2012-09-26 MED ORDER — TRAMADOL HCL 50 MG PO TABS: 50.0000 mg | ORAL_TABLET | Freq: Four times a day (QID) | ORAL | Status: AC | PRN

## 2012-09-26 MED ORDER — DM-GUAIFENESIN ER 60-1200 MG PO TB12: 1.0000 | ORAL_TABLET | Freq: Two times a day (BID) | ORAL | Status: DC

## 2012-09-26 MED ORDER — ALBUTEROL SULFATE (2.5 MG/3ML) 0.083% IN NEBU: 2.5000 mg | INHALATION_SOLUTION | Freq: Four times a day (QID) | RESPIRATORY_TRACT | Status: AC | PRN

## 2012-09-26 MED ORDER — MOXIFLOXACIN HCL 400 MG PO TABS: 400.0000 mg | ORAL_TABLET | Freq: Every day | ORAL | Status: DC

## 2012-09-26 MED ORDER — IPRATROPIUM BROMIDE 0.02 % IN SOLN: 0.5000 mg | RESPIRATORY_TRACT | Status: DC | PRN

## 2012-09-26 NOTE — Discharge Summary (Signed)
Physician Discharge Summary  Barbara [name] ZOX:[id number] DOB: Apr 04, 1951 DOA: 09/23/2012  PCP: Barbara Junes, MD  Admit date: 09/23/2012 Discharge date: 09/26/2012  Recommendations for Outpatient Follow-up:  1. Pt will need to follow up with PCP in 2-3 weeks post discharge 2. Please obtain BMP to evaluate electrolytes and kidney function 3. Please also check CBC to evaluate Hg and Hct levels 4. Pt was discharge on antibiotic to complete the therapy for 5 more days 5. Pt was also discharged on Prednisone taper  Discharge Diagnoses:  Acute respiratory failure secondary to acute COPD exacerbation with PNA  Discharge Condition: Stable  Diet recommendation: Heart healthy diet discussed in details   Brief narrative:  HPI:  Pt is 61 yo female who was sent from Dr. Sherene [name] office for further evaluation and management of new onset shortness of breath. Pt reports history of COPD and her shortness of breath has been getting progressively worse over the past few days, associated with mid area chest pain, intermittent in nature and non radiating, 7/10 in severity when present, aggravated with deep breath and cough which is productive of yellow sputum and with no specific alleviating factors. Pt denies any fevers, chills, other systemic symptoms, no similar events in the past. She denies any abdominal or urinary concerns, no recent sick contacts or exposures.   Assessment and Plan:  Shortness of breath  - based on symptoms and clinical exam findings this is mostly consistent with acute on chronic COPD exacerbation  - continued to provide nebulizers PRN and scheduled along with empiric antibiotic, solumedrol initially with transition to Prednisone - monitored vitals per protocol and pt has responded well to the therapy, maintained oxygen saturations > 95 % at rest on RA and ~95% with ambulation on RA as well - pt was discharged on antibiotic and prednisone taper (antibiotic Zithromax) Chest  pain  - likely musculoskeletal etiology  - resolved, no events on telemetry over 48 hours - troponin x 3 within normal limits  - TSH is within normal limits  Leukocytosis  - likely form steroid use  - will continue to taper down  - CBC will need to be checked upon follow up with PCP Acute renal failure  - unclear etiology  - now resolved with IVF  Code Status: Full  Family Communication: Pt at bedside  Disposition Plan: D/C today  Consultants:  None  Procedures/Studies:  Dg Chest 2 View  09/23/2012  Enlargement of cardiac silhouette. COPD changes. No acute abnormalities.   Antibiotics:  Zithromax 09/23/2012 -->  Discharge Exam: Filed Vitals:   09/26/12 0600  BP: 116/77  Pulse: 44  Temp: 97.9 F (36.6 C)  Resp: 20   Filed Vitals:   09/25/12 2034 09/25/12 2200 09/26/12 0337 09/26/12 0600  BP:  101/62  116/77  Pulse:  56  44  Temp:  98.2 F (36.8 C)  97.9 F (36.6 C)  TempSrc:  Oral  Oral  Resp:  20  20  Height:      Weight:      SpO2: 95% 98% 96% 100%    General: Pt is alert, follows commands appropriately, not in acute distress Cardiovascular: Regular rate and rhythm, S1/S2 +, no murmurs, no rubs, no gallops Respiratory: Clear to auscultation bilaterally, no wheezing, no crackles, no rhonchi Abdominal: Soft, non tender, non distended, bowel sounds +, no guarding Extremities: no edema, no cyanosis, pulses palpable bilaterally DP and PT Neuro: Grossly nonfocal  Discharge Instructions  Discharge Orders    Future Orders Please Complete  By Expires   Diet - low sodium heart healthy      Increase activity slowly          Medication List     As of 09/26/2012  7:50 AM    STOP taking these medications         dextromethorphan 30 MG/5ML liquid   Commonly known as: DELSYM      TAKE these medications         albuterol 108 (90 BASE) MCG/ACT inhaler   Commonly known as: PROVENTIL HFA;VENTOLIN HFA   Inhale 2 puffs into the lungs every 6 (six) hours as  needed. For shortness of breath      albuterol (2.5 MG/3ML) 0.083% nebulizer solution   Commonly known as: PROVENTIL   Take 3 mLs (2.5 mg total) by nebulization every 6 (six) hours as needed. Breathing      amLODipine 10 MG tablet   Commonly known as: NORVASC   Take 10 mg by mouth daily.      Dextromethorphan-Guaifenesin 60-1200 MG per 12 hr tablet   Take 1 tablet by mouth every 12 (twelve) hours.      famotidine 20 MG tablet   Commonly known as: PEPCID   Take 20 mg by mouth at bedtime as needed. One at bedtime      hydrochlorothiazide 25 MG tablet   Commonly known as: HYDRODIURIL   Take 25 mg by mouth daily.      ipratropium 0.02 % nebulizer solution   Commonly known as: ATROVENT   Take 2.5 mLs (0.5 mg total) by nebulization every 4 (four) hours as needed.      Mometasone Furo-Formoterol Fum 200-5 MCG/ACT Aero   Place 2 puffs into the nose 2 (two) times daily. Take 2 puffs first thing in am and then another 2 puffs about 12 hours later.      moxifloxacin 400 MG tablet   Commonly known as: AVELOX   Take 1 tablet (400 mg total) by mouth daily.      pantoprazole 40 MG tablet   Commonly known as: PROTONIX   Take 40 mg by mouth daily before breakfast.      predniSONE 10 MG tablet   Commonly known as: DELTASONE   Take 40 mg tablet today 10/20 and one tablet 10/21, take 30 mg tablet 10/22 and  one tablet 10/23, take 20 mg tablet 10/24 and 10/25, take 10 mg tablet 10/26 and 10/27 and then stop.      traMADol 50 MG tablet   Commonly known as: ULTRAM   Take 1 tablet (50 mg total) by mouth every 6 (six) hours as needed for pain. 1 every 6 hours as needed           Follow-up Information    Follow up with Barbara Junes, MD. In 2 weeks.   Contact information:   [name] [place] [address] [phone]       Follow up with Barbara Hughs, MD. In 4 weeks.   Contact information:   [address] [place]  [address] [phone]           The results of significant diagnostics from this hospitalization (including imaging, microbiology, ancillary and laboratory) are listed below for reference.     Microbiology: No results found for this or any previous visit (from the past 240 hour(s)).   Labs: Basic Metabolic Panel:  Lab 09/26/12 9528 09/25/12 0539 09/24/12 0310 09/23/12 1245  NA 139 137 135 137  K 3.5 3.8 4.2 3.9  CL 101 102 100 101  CO2 29 26 24 26   GLUCOSE 90 112* 150* 80  BUN 15 18 18 10   CREATININE 0.95 0.88 1.21* 0.91  CALCIUM 9.0 8.8 9.0 9.3  MG -- -- -- --  PHOS -- -- -- --   Liver Function Tests:  Lab 09/23/12 1245  AST 21  ALT 17  ALKPHOS 91  BILITOT 0.2*  PROT 7.5  ALBUMIN 3.8   No results found for this basename: LIPASE:5,AMYLASE:5 in the last 168 hours No results found for this basename: AMMONIA:5 in the last 168 hours CBC:  Lab 09/26/12 0521 09/25/12 0539 09/24/12 0310 09/23/12 1245  WBC 10.9* 14.3* 10.8* 7.3  NEUTROABS -- -- -- --  HGB 11.8* 11.5* 12.0 12.5  HCT 36.0 34.9* 36.3 38.0  MCV 91.1 91.1 91.2 90.9  PLT 175 166 154 168   Cardiac Enzymes:  Lab 09/24/12 0310 09/23/12 2132 09/23/12 1529 09/23/12 1245  CKTOTAL -- -- -- --  CKMB -- -- -- --  CKMBINDEX -- -- -- --  TROPONINI <0.30 <0.30 <0.30 <0.30   BNP: BNP (last 3 results)  Basename 09/23/12 1245 03/10/12 1115 02/10/12 1824  PROBNP 354.6* 99.8 57.5   CBG: No results found for this basename: GLUCAP:5 in the last 168 hours   SIGNED: Time coordinating discharge: Over 30 minutes  Debbora Presto, MD  [facility] 09/26/2012, 7:50 AM Pager [phone]  If 7PM-7AM, please contact night-coverage [email or web address] Password [email or web address]

## 2012-09-26 NOTE — Progress Notes (Signed)
Pt ambulated length of hall and back on RA  o2 sats at 92-94%

## 2013-03-08 ENCOUNTER — Ambulatory Visit: Payer: Self-pay | Admitting: Family Medicine

## 2013-03-08 VITALS — BP 118/70 | HR 71 | Temp 98.6°F | Resp 12 | Ht 64.0 in | Wt 188.0 lb

## 2013-03-08 DIAGNOSIS — R21 Rash and other nonspecific skin eruption: Secondary | ICD-10-CM

## 2013-03-08 LAB — POCT SKIN KOH: Skin KOH, POC: NEGATIVE

## 2013-03-08 NOTE — Progress Notes (Signed)
  Subjective:    Patient ID: Barbara [name], female    DOB: [birth year], 62 y.o.   MRN: [id number]  HPI Barbara [name] is a 62 y.o. female Hx of copd, former tobacco abuse.  Here today with concerns of rash/darkened areas on neck and scalp.   Noticed 3 months ago by hairstylist.  Brown spots on scalp and then neck..  Sore in areas at times, when washing hair. Neck rash past month or two.  Noticing areas of hair thinning? Not itching. Not sore now- only initially.   No fever/chills, otherwise feels well. No other affected areas on body.   Relaxer treatment - every 6 weeks, but regular shampoo, every week, and no known new treatments.    Nicotine patch past year, but not for past few weeks.   Tx: hair oil.    Review of Systems     Objective:   Physical Exam  Vitals reviewed. Constitutional: She appears well-developed and well-nourished. No distress.  HENT:  Head: Head is without abrasion and without contusion. Hair is normal.    Pulmonary/Chest: Effort normal.    Results for orders placed in visit on 03/08/13  POCT SKIN KOH      Result Value Range   Skin KOH, POC Negative      2nd MD exam performed.     Assessment & Plan:   Barbara [name] is a 62 y.o. female Rash and nonspecific skin eruption - Plan: POCT Skin KOH, Ambulatory referral to Dermatology   Rash - scalp for few months, now into neck.  Oval patches with slight atrophic appearance centrally.  ddx includes morphea, discoid lupus, scleroderma.  Doubt tinea capitis with presentation.  Will refer to derm for eval and probable biopsy.    Patient Instructions  We will call you about the dermatologist appointment. Return to the clinic or go to the nearest emergency room if any of your symptoms worsen or new symptoms occur.

## 2013-03-08 NOTE — Patient Instructions (Signed)
We will call you about the dermatologist appointment. Return to the clinic or go to the nearest emergency room if any of your symptoms worsen or new symptoms occur.

## 2013-12-23 ENCOUNTER — Ambulatory Visit (INDEPENDENT_AMBULATORY_CARE_PROVIDER_SITE_OTHER): Payer: Medicare HMO | Admitting: Internal Medicine

## 2013-12-23 ENCOUNTER — Encounter: Payer: Self-pay | Admitting: Internal Medicine

## 2013-12-23 ENCOUNTER — Ambulatory Visit (INDEPENDENT_AMBULATORY_CARE_PROVIDER_SITE_OTHER)
Admission: RE | Admit: 2013-12-23 | Discharge: 2013-12-23 | Disposition: A | Discharge status: Home / Self Care | Payer: Medicare HMO | Source: Ambulatory Visit | Attending: Internal Medicine | Admitting: Internal Medicine

## 2013-12-23 VITALS — BP 98/60 | HR 62 | Temp 98.0°F | Ht 65.0 in | Wt 189.0 lb

## 2013-12-23 DIAGNOSIS — J449 Chronic obstructive pulmonary disease, unspecified: Secondary | ICD-10-CM

## 2013-12-23 NOTE — Progress Notes (Signed)
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   The patient states that she had a course of steroids and almost immediately after completing the steroids started having difficulty with breathing and a nonproductive cough.   The patient denied ever having had PFTs in the past. She has never been under care and pulmonologist due to cost issues. rx as aecopd with component of ab and vcd and discharged 03/12/12  03/15/2012 f/u ov/Exavior Kimmons post hosp f/u tapering prednisone down to 20 mg today and 10 mg 4/9 and stop. States fine when on prednisone unless extreme temperatures but even on "fine days" still wakes up and used alb neb. Minimal cough, hoarsness. rec Work on inhaler technique:       Stop symbicort Dulera 200 Take 2 puffs first thing in am and then another 2 puffs about 12 hours later.  Continue protonix 40 mg Take 30-60 min before first meal of the day  For cough use Tramadol as needed Only use your albuterol as a rescue medication(Plan B Albuterol hfa/ventolin vs Plan C nebulizer)      04/14/2012 f/u ov/[name] cc much better breathing, still dry cough, no need for saba rescue.  Could not tol ppi due to stomach cramping > resolved off. rec Take delsym two tsp every 12 hours and supplement if needed with  tramadol 50 mg up to 1-2 every 4 hours to suppress the urge to cough.     If not improving, start Prednisone 10 mg take  4 each am x 2 days,   2 each am x 2 days,  1 each am x2days and stop Stop spiriva Add pepcid 20 mg one at bedtime Please schedule a follow up office visit in 4 weeks, sooner if needed with pft's bring all meds/inhalers/neb solutions with you    05/24/2012 f/u ov/Kaizer Dissinger cc best she's felt in a while using dulera  and min need for saba daytime and never the neb,  no purulent sputum or limiting sob, no variability to symptoms. rec Work on inhaler technique:   .     If you are satisfied with your treatment plan let your doctor know and he/she can either refill your medications or you can return here when your prescription runs out.    If in any way you are not 100% satisfied,  please tell us.  If 100% better, tell your friends!     12/23/2013 f/u ov/[name] re: GOLD II copd Chief Complaint  Patient presents with  . Follow-up    Rov.  Pt c/o SOB with exertion, some nonprod cough.  Pt states "she is doing good".  Pt has quit smoking since lv.    On dulera 200  2bid plus ventolin prn  Up to 4 x daily "I use it expectantly"   No obvious day to day or daytime variabilty or assoc chronic cough or cp or chest tightness, subjective wheeze overt sinus or hb symptoms. No unusual exp hx or h/o childhood pna/ asthma or knowledge of premature birth.  Sleeping ok without nocturnal  or early am exacerbation  of respiratory  c/o's or need for noct saba. Also denies any obvious fluctuation of symptoms with weather or environmental changes or other  aggravating or alleviating factors except as outlined above   Current Medications, Allergies, Complete Past Medical History, Past Surgical History, Family History, and Social History were reviewed in [employer] record.  ROS  The following are not active complaints unless bolded sore throat, dysphagia, dental problems, itching, sneezing,  nasal congestion or excess/ purulent secretions, ear ache,   fever, chills, sweats, unintended wt loss, pleuritic or exertional cp, hemoptysis,  orthopnea pnd or leg swelling, presyncope, palpitations, heartburn, abdominal pain, anorexia, nausea, vomiting, diarrhea  or change in bowel or urinary habits, change in stools or urine, dysuria,hematuria,  rash, arthralgias, visual complaints, headache, numbness weakness or ataxia or problems with walking or coordination,  change in  mood/affect or memory.             Objective:   Physical Exam  obese  Bf, nad   Wt 172 03/15/2012 > 04/14/2012  176 > 05/24/2012  165 > 09/07/2012  161 >169 09/23/2012 > 12/23/2013  189      HEENT:  [place]/AT,  EACs-clear, TMs-wnl, NOSE-clear, THROAT-clear, no lesions, no postnasal drip or exudate noted.   NECK:  Supple w/ fair ROM; no JVD; normal carotid impulses w/o bruits; no thyromegaly or nodules palpated; no lymphadenopathy.  RESP  slt distant bilateral bs, no accessory muscle use, no dullness to percussion  CARD:  SB  no m/r/g  , no peripheral edema, pulses intact, no cyanosis or clubbing.  GI:   Soft & nt; nml bowel sounds; no organomegaly or masses detected.  Musco: Warm bil, no deformities or joint swelling noted.   Neuro: alert, no focal deficits noted.    Skin: Warm, no lesions or rashes   CXR  12/23/2013 :    There is mild hyperinflation which may reflect the clinically suspected COPD. There is no evidence of pneumonia nor of CHF.    Assessment:

## 2013-12-23 NOTE — Patient Instructions (Signed)
Think of your respiratory medications as multiple steps you can take to control your symptoms and avoid having to go to the ER   Plan A is your maintenance daily no matter what meds: Automatic: dulera 200 2 every 12 hours  Plan B - Backup and only if you can't catch your breath: ?Ventolin up to 2 puffs every 4 hours Plan C only use after you've used plan A and B and still can't catch your breath: use the nebulizer  Plan D(for Doctor):  If you've used A thru C and not doing a lot better    D = call the doctor for evaluation asap Plan E (for ER):  If still not able to catch your breath, even after using your nebulizer up to every 4 hours, go to ER   Please remember to go to the  x-ray department downstairs for your tests - we will call you with the results when they are available.  Congratulations on not smoking!   If you are satisfied with your treatment plan let your doctor know and he/she can either refill your medications or you can return here when your prescription runs out.     If in any way you are not 100% satisfied,  please tell us.  If 100% better, tell your friends!

## 2013-12-24 ENCOUNTER — Encounter: Payer: Self-pay | Admitting: Internal Medicine

## 2013-12-24 NOTE — Assessment & Plan Note (Signed)
-   Spirometry 03/15/2012 FEV1 1.54 (74%) ratio 65    - PFT's 05/24/2012 FEV1.57 (69%) with ratio 66 and no better p B2 and DLC0 59    The proper method of use, as well as anticipated side effects, of a metered-dose inhaler are discussed and demonstrated to the patient. Improved effectiveness after extensive coaching during this visit to a level of approximately  90%   I had an extended summary  discussion with the patient today lasting 15 to 20 minutes of a 25 minute visit on the following issues:  1) I reviewed the Flethcher curve with patient that basically indicates  if you quit smoking when your best day FEV1 is still well preserved it is highly unlikely you will progress to severe disease and informed the patient there was no medication on the market that has proven to change the curve or the likelihood of progression.  Therefore stopping smoking and maintaining abstinence is the most important aspect of care, not choice of inhalers or for that matter, doctors.    2) she only has mild dz and should not need saba so often -much of her doe is now related to wt gain from smoking  3) Each maintenance medication was reviewed in detail including most importantly the difference between maintenance and as needed and under what circumstances the prns are to be used.  Please see instructions for details which were reviewed in writing and the patient given a copy.    Pulmonary f/u can be prn new symptoms

## 2013-12-26 NOTE — Progress Notes (Signed)
Quick Note:  Spoke with pt and notified of results per Dr. Wert. Pt verbalized understanding and denied any questions.  ______ 

## 2014-10-02 ENCOUNTER — Other Ambulatory Visit: Payer: Self-pay

## 2014-10-02 DIAGNOSIS — Z1231 Encounter for screening mammogram for malignant neoplasm of breast: Secondary | ICD-10-CM

## 2014-10-26 ENCOUNTER — Encounter (INDEPENDENT_AMBULATORY_CARE_PROVIDER_SITE_OTHER): Payer: Self-pay

## 2014-10-26 ENCOUNTER — Ambulatory Visit
Admission: RE | Admit: 2014-10-26 | Discharge: 2014-10-26 | Disposition: A | Discharge status: Home / Self Care | Payer: Commercial Managed Care - HMO | Source: Ambulatory Visit

## 2014-10-26 DIAGNOSIS — Z1231 Encounter for screening mammogram for malignant neoplasm of breast: Secondary | ICD-10-CM

## 2014-12-20 DIAGNOSIS — L853 Xerosis cutis: Secondary | ICD-10-CM | POA: Diagnosis not present

## 2014-12-20 DIAGNOSIS — L930 Discoid lupus erythematosus: Secondary | ICD-10-CM | POA: Diagnosis not present

## 2014-12-28 DIAGNOSIS — M150 Primary generalized (osteo)arthritis: Secondary | ICD-10-CM | POA: Diagnosis not present

## 2014-12-28 DIAGNOSIS — M25551 Pain in right hip: Secondary | ICD-10-CM | POA: Diagnosis not present

## 2014-12-28 DIAGNOSIS — L659 Nonscarring hair loss, unspecified: Secondary | ICD-10-CM | POA: Diagnosis not present

## 2014-12-28 DIAGNOSIS — L930 Discoid lupus erythematosus: Secondary | ICD-10-CM | POA: Diagnosis not present

## 2015-01-01 ENCOUNTER — Encounter (INDEPENDENT_AMBULATORY_CARE_PROVIDER_SITE_OTHER): Payer: Self-pay

## 2015-01-01 ENCOUNTER — Ambulatory Visit (INDEPENDENT_AMBULATORY_CARE_PROVIDER_SITE_OTHER): Payer: Commercial Managed Care - HMO | Admitting: Internal Medicine

## 2015-01-01 ENCOUNTER — Encounter: Payer: Self-pay | Admitting: Internal Medicine

## 2015-01-01 VITALS — BP 118/80 | HR 62 | Ht 64.5 in | Wt 185.0 lb

## 2015-01-01 DIAGNOSIS — J449 Chronic obstructive pulmonary disease, unspecified: Secondary | ICD-10-CM

## 2015-01-01 DIAGNOSIS — K219 Gastro-esophageal reflux disease without esophagitis: Secondary | ICD-10-CM | POA: Diagnosis not present

## 2015-01-01 DIAGNOSIS — Z1389 Encounter for screening for other disorder: Secondary | ICD-10-CM | POA: Diagnosis not present

## 2015-01-01 DIAGNOSIS — I129 Hypertensive chronic kidney disease with stage 1 through stage 4 chronic kidney disease, or unspecified chronic kidney disease: Secondary | ICD-10-CM | POA: Diagnosis not present

## 2015-01-01 DIAGNOSIS — N183 Chronic kidney disease, stage 3 (moderate): Secondary | ICD-10-CM | POA: Diagnosis not present

## 2015-01-01 DIAGNOSIS — L930 Discoid lupus erythematosus: Secondary | ICD-10-CM | POA: Diagnosis not present

## 2015-01-01 MED ORDER — MOMETASONE FURO-FORMOTEROL FUM 100-5 MCG/ACT IN AERO: INHALATION_SPRAY | RESPIRATORY_TRACT | Status: DC

## 2015-01-01 NOTE — Progress Notes (Signed)
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 and CCM asked to see on that admit.   The patient states that she had a course of steroids and almost immediately after completing the steroids started having difficulty with breathing and a nonproductive cough.   The patient denied ever having had PFTs in the past. She has never been under care and pulmonologist due to cost issues. rx as aecopd with component of ab and vcd and discharged 03/12/12  03/15/2012 f/u ov/Barbara [name] post hosp f/u tapering prednisone down to 20 mg today and 10 mg 4/9 and stop. States fine when on prednisone unless extreme temperatures but even on "fine days" still wakes up and used alb neb. Minimal cough, hoarsness. rec Work on inhaler technique:       Stop symbicort Dulera 200 Take 2 puffs first thing in am and then another 2 puffs about 12 hours later.  Continue protonix 40 mg Take 30-60 min before first meal of the day  For cough use Tramadol as needed Only use your albuterol as a rescue medication(Plan B Albuterol hfa/ventolin vs Plan C nebulizer)      04/14/2012 f/u ov/Barbara [name] cc much better breathing, still dry cough, no need for saba rescue.  Could not tol ppi due to stomach cramping > resolved off. rec Take delsym two tsp every 12 hours and supplement if needed with  tramadol 50 mg up to 1-2 every 4 hours to suppress the urge to cough.     If not improving, start Prednisone 10 mg take  4 each am x 2 days,   2 each am x 2 days,  1 each am x2days and stop Stop spiriva Add pepcid 20 mg one at bedtime Please schedule a follow up office visit in 4 weeks, sooner if needed with pft's bring all meds/inhalers/neb solutions with you    05/24/2012 f/u ov/Barbara [name] cc best she's felt in a while using dulera  and min need for saba daytime and never the neb,  no purulent sputum or limiting sob, no variability to symptoms. rec Work on inhaler technique:   .     If you are satisfied with your treatment plan let your doctor know and he/she can either refill your medications or you can return here when your prescription runs out.    If in any way you are not 100% satisfied,  please tell us.  If 100% better, tell your friends!     12/23/2013 f/u ov/Barbara [name] re: GOLD II copd Chief Complaint  Patient presents with  . Follow-up    Rov.  Pt c/o SOB with exertion, some nonprod cough.  Pt states "she is doing good".  Pt has quit smoking since lv.  On dulera 200  2bid plus ventolin prn  Up to 4 x daily "I use it expectantly" rec Think of your respiratory medications as multiple steps you can take to control your symptoms and avoid having to go to the ER Plan A is your maintenance daily no matter what meds: Automatic: dulera 200 2 every 12 hours  Plan B - Backup and only if you can't catch your breath: ?Ventolin up to 2 puffs every 4 hours Plan C only use after you've used plan A and B and still can't  catch your breath: use the nebulizer  Plan D(for Doctor):  If you've used A thru C and not doing a lot better    D = call the doctor for evaluation asap Plan E (for ER):  If still not able to catch your breath, even after using your nebulizer up to every 4 hours, go to ER   lable     01/01/2015 f/u ov/Barbara [name] re: COPD GOLD II on dulera 200 2bid Chief Complaint  Patient presents with  . Follow-up    Pt states her breathing is doing well. No new co's today. She is using albuterol maybe once per wk ion average. Rarely uses neb.      Not limited by breathing from desired activities    No obvious day to day or daytime variabilty or assoc chronic cough or cp or chest tightness, subjective wheeze overt sinus or hb symptoms. No unusual exp hx or h/o childhood pna/ asthma or knowledge of premature birth.  Sleeping ok without nocturnal  or early am exacerbation  of  respiratory  c/o's or need for noct saba. Also denies any obvious fluctuation of symptoms with weather or environmental changes or other aggravating or alleviating factors except as outlined above   Current Medications, Allergies, Complete Past Medical History, Past Surgical History, Family History, and Social History were reviewed in [employer] record.  ROS  The following are not active complaints unless bolded sore throat, dysphagia, dental problems, itching, sneezing,  nasal congestion or excess/ purulent secretions, ear ache,   fever, chills, sweats, unintended wt loss, pleuritic or exertional cp, hemoptysis,  orthopnea pnd or leg swelling, presyncope, palpitations, heartburn, abdominal pain, anorexia, nausea, vomiting, diarrhea  or change in bowel or urinary habits, change in stools or urine, dysuria,hematuria,  rash, arthralgias, visual complaints, headache, numbness weakness or ataxia or problems with walking or coordination,  change in mood/affect or memory.             Objective:   Physical Exam  obese  Bf, nad   Wt 172 03/15/2012 > 04/14/2012  176 > 05/24/2012  165 > 09/07/2012  161 >169 09/23/2012 > 12/23/2013  189 > 01/01/2015 185      HEENT:  [place]/AT,  EACs-clear, TMs-wnl, NOSE-clear, THROAT-clear, no lesions, no postnasal drip or exudate noted.   NECK:  Supple w/ fair ROM; no JVD; normal carotid impulses w/o bruits; no thyromegaly or nodules palpated; no lymphadenopathy.  RESP  slt distant bilateral bs, no accessory muscle use, no dullness to percussion  CARD:  SB  no m/r/g  , no peripheral edema, pulses intact, no cyanosis or clubbing.  GI:   Soft & nt; nml bowel sounds; no organomegaly or masses detected.  Musco: Warm bil, no deformities or joint swelling noted.   Neuro: alert, no focal deficits noted.    Skin: Warm, no lesions or rashes   CXR  12/23/2013 :    There is mild hyperinflation which may reflect the clinically suspected COPD. There is no  evidence of pneumonia nor of CHF.    Assessment:    Outpatient Encounter Prescriptions as of 01/01/2015  Medication Sig  . albuterol (PROVENTIL HFA;VENTOLIN HFA) 108 (90 BASE) MCG/ACT inhaler Inhale 2 puffs into the lungs every 6 (six) hours as needed. For shortness of breath  . albuterol (PROVENTIL) (2.5 MG/3ML) 0.083% nebulizer solution Take 3 mLs (2.5 mg total) by nebulization every 6 (six) hours as needed. Breathing  . amLODipine (NORVASC) 10 MG tablet Take 10 mg by  mouth daily.   . Calcium Carbonate-Vit D-Min (CALCIUM 1200 PO) Take 1 tablet by mouth daily.  . clobetasol cream (TEMOVATE) 6.57 % Apply 1 application topically 2 (two) times daily.  Marland Kitchen Cod Liver Oil CAPS Take 1 capsule by mouth daily.  Marland Kitchen dexlansoprazole (DEXILANT) 60 MG capsule Take 60 mg by mouth daily.  Marland Kitchen Dextromethorphan-Guaifenesin 60-1200 MG per 12 hr tablet Take 1 tablet by mouth every 12 (twelve) hours. (Patient taking differently: Take 1 tablet by mouth daily. )  . famotidine (PEPCID) 20 MG tablet Take 20 mg by mouth daily. One at bedtime  . folic acid (FOLVITE) 1 MG tablet Take 1 mg by mouth daily.  . hydrochlorothiazide (HYDRODIURIL) 25 MG tablet Take 25 mg by mouth daily.   . hydroxychloroquine (PLAQUENIL) 200 MG tablet Take 200 mg by mouth daily.   . magnesium gluconate (MAGONATE) 500 MG tablet Take 500 mg by mouth daily.  . Omega-3 Fatty Acids (FISH OIL) 1200 MG CAPS Take 1 capsule by mouth daily.  . vitamin D, CHOLECALCIFEROL, 400 UNITS tablet Take 400 Units by mouth daily.  . [DISCONTINUED] Mometasone Furo-Formoterol Fum 200-5 MCG/ACT AERO Place 2 puffs into the nose 2 (two) times daily. dulera  . mometasone-formoterol (DULERA) 100-5 MCG/ACT AERO Take 2 puffs first thing in am and then another 2 puffs about 12 hours later.  . [DISCONTINUED] hydrocortisone 2.5 % cream Apply 1 application topically daily.  . [DISCONTINUED] pantoprazole (PROTONIX) 40 MG tablet Take 40 mg by mouth daily before breakfast.  .  [DISCONTINUED] traMADol (ULTRAM) 50 MG tablet Take 50 mg by mouth 2 (two) times daily.   . [DISCONTINUED] triamcinolone ointment (KENALOG) 0.1 % Apply 1 application topically 2 (two) times daily.

## 2015-01-01 NOTE — Patient Instructions (Signed)
Your technique is excellent and you should therefore be able to do just as well with the dulera 100 2 every 12 hours when the 200's run out   If you are satisfied with your treatment plan,  let your doctor know and he/she can either refill your medications or you can return here when your prescription runs out.     If in any way you are not 100% satisfied,  please tell us.  If 100% better, tell your friends!  Pulmonary follow up is as needed

## 2015-01-01 NOTE — Assessment & Plan Note (Addendum)
-   Quit smoking 2012    - Spirometry 03/15/2012 FEV1 1.54 (74%) ratio 65    - PFT's 05/24/2012 FEV1  1.57 (69%) with ratio 66 and no better p B2 and DLC0 59    - 01/01/2015 p extensive coaching HFA effectiveness =   100%    I had an summary extended discussion with the patient reviewing all relevant studies completed to date and  lasting 15 to 20 minutes of a 25 minute visit on the following ongoing concerns:   1) she is doing great with only mild copd so ok to try dulera 100 2bid if doing just as well and refills per Dr Nancy Fetter  2) She declined low dose CT screening for lung ca  3)  I reviewed the Flethcher curve with patient that basically indicates  if you quit smoking when your best day FEV1 is still well preserved (as is the case here)  it is highly unlikely you will progress to severe disease and informed the patient there was no medication on the market that has proven to change the curve or the likelihood of progression.  Therefore   maintaining abstinence from cigs is the most important aspect of care, not choice of inhalers or for that matter, doctors.    3) pulmonary f/u is prn

## 2015-01-18 DIAGNOSIS — N951 Menopausal and female climacteric states: Secondary | ICD-10-CM | POA: Diagnosis not present

## 2015-01-25 DIAGNOSIS — J449 Chronic obstructive pulmonary disease, unspecified: Secondary | ICD-10-CM | POA: Diagnosis not present

## 2015-01-25 DIAGNOSIS — J069 Acute upper respiratory infection, unspecified: Secondary | ICD-10-CM | POA: Diagnosis not present

## 2015-02-19 ENCOUNTER — Telehealth: Payer: Self-pay | Admitting: Internal Medicine

## 2015-02-19 MED ORDER — MOMETASONE FURO-FORMOTEROL FUM 200-5 MCG/ACT IN AERO: 2.0000 | INHALATION_SPRAY | Freq: Two times a day (BID) | RESPIRATORY_TRACT | Status: DC

## 2015-02-19 NOTE — Telephone Encounter (Signed)
Spoke with pt, states she tried to come down to [facility] 100 after finishing her 200 inhaler but it did not work for her.  Switched out her sample for a dulera 200 sample and d/c'ed the 100 in her med list.  Nothing further needed at this time.

## 2015-02-19 NOTE — Telephone Encounter (Signed)
Sample will be left at front desk. Pt is aware. Nothing further was needed.

## 2015-02-20 ENCOUNTER — Other Ambulatory Visit: Payer: Self-pay | Admitting: Family Medicine

## 2015-02-20 ENCOUNTER — Ambulatory Visit
Admission: RE | Admit: 2015-02-20 | Discharge: 2015-02-20 | Disposition: A | Discharge status: Home / Self Care | Payer: Commercial Managed Care - HMO | Source: Ambulatory Visit | Attending: Family Medicine | Admitting: Family Medicine

## 2015-02-20 DIAGNOSIS — S60011A Contusion of right thumb without damage to nail, initial encounter: Secondary | ICD-10-CM

## 2015-02-20 DIAGNOSIS — S62521A Displaced fracture of distal phalanx of right thumb, initial encounter for closed fracture: Secondary | ICD-10-CM | POA: Diagnosis not present

## 2015-02-20 DIAGNOSIS — S62523A Displaced fracture of distal phalanx of unspecified thumb, initial encounter for closed fracture: Secondary | ICD-10-CM | POA: Diagnosis not present

## 2015-03-06 DIAGNOSIS — S62521D Displaced fracture of distal phalanx of right thumb, subsequent encounter for fracture with routine healing: Secondary | ICD-10-CM | POA: Diagnosis not present

## 2015-03-27 DIAGNOSIS — S62521D Displaced fracture of distal phalanx of right thumb, subsequent encounter for fracture with routine healing: Secondary | ICD-10-CM | POA: Diagnosis not present

## 2015-04-02 DIAGNOSIS — S62521D Displaced fracture of distal phalanx of right thumb, subsequent encounter for fracture with routine healing: Secondary | ICD-10-CM | POA: Diagnosis not present

## 2015-04-17 DIAGNOSIS — S62521D Displaced fracture of distal phalanx of right thumb, subsequent encounter for fracture with routine healing: Secondary | ICD-10-CM | POA: Diagnosis not present

## 2015-04-19 DIAGNOSIS — S62521D Displaced fracture of distal phalanx of right thumb, subsequent encounter for fracture with routine healing: Secondary | ICD-10-CM | POA: Diagnosis not present

## 2015-04-19 DIAGNOSIS — M65332 Trigger finger, left middle finger: Secondary | ICD-10-CM | POA: Diagnosis not present

## 2015-04-24 DIAGNOSIS — M65332 Trigger finger, left middle finger: Secondary | ICD-10-CM | POA: Diagnosis not present

## 2015-04-27 DIAGNOSIS — I10 Essential (primary) hypertension: Secondary | ICD-10-CM | POA: Diagnosis not present

## 2015-04-27 DIAGNOSIS — Z0100 Encounter for examination of eyes and vision without abnormal findings: Secondary | ICD-10-CM | POA: Diagnosis not present

## 2015-05-04 DIAGNOSIS — S62521D Displaced fracture of distal phalanx of right thumb, subsequent encounter for fracture with routine healing: Secondary | ICD-10-CM | POA: Diagnosis not present

## 2015-05-15 DIAGNOSIS — M65332 Trigger finger, left middle finger: Secondary | ICD-10-CM | POA: Diagnosis not present

## 2015-05-18 DIAGNOSIS — M65332 Trigger finger, left middle finger: Secondary | ICD-10-CM | POA: Diagnosis not present

## 2015-06-01 DIAGNOSIS — M65332 Trigger finger, left middle finger: Secondary | ICD-10-CM | POA: Diagnosis not present

## 2015-06-12 DIAGNOSIS — M65332 Trigger finger, left middle finger: Secondary | ICD-10-CM | POA: Diagnosis not present

## 2015-06-13 DIAGNOSIS — L659 Nonscarring hair loss, unspecified: Secondary | ICD-10-CM | POA: Diagnosis not present

## 2015-06-13 DIAGNOSIS — L853 Xerosis cutis: Secondary | ICD-10-CM | POA: Diagnosis not present

## 2015-06-13 DIAGNOSIS — L930 Discoid lupus erythematosus: Secondary | ICD-10-CM | POA: Diagnosis not present

## 2015-06-13 DIAGNOSIS — M150 Primary generalized (osteo)arthritis: Secondary | ICD-10-CM | POA: Diagnosis not present

## 2015-06-13 DIAGNOSIS — R202 Paresthesia of skin: Secondary | ICD-10-CM | POA: Diagnosis not present

## 2015-06-13 DIAGNOSIS — M25551 Pain in right hip: Secondary | ICD-10-CM | POA: Diagnosis not present

## 2015-06-26 DIAGNOSIS — M65332 Trigger finger, left middle finger: Secondary | ICD-10-CM | POA: Diagnosis not present

## 2015-07-02 DIAGNOSIS — K219 Gastro-esophageal reflux disease without esophagitis: Secondary | ICD-10-CM | POA: Diagnosis not present

## 2015-07-02 DIAGNOSIS — I129 Hypertensive chronic kidney disease with stage 1 through stage 4 chronic kidney disease, or unspecified chronic kidney disease: Secondary | ICD-10-CM | POA: Diagnosis not present

## 2015-07-02 DIAGNOSIS — N183 Chronic kidney disease, stage 3 (moderate): Secondary | ICD-10-CM | POA: Diagnosis not present

## 2015-07-02 DIAGNOSIS — Z23 Encounter for immunization: Secondary | ICD-10-CM | POA: Diagnosis not present

## 2015-07-02 DIAGNOSIS — J449 Chronic obstructive pulmonary disease, unspecified: Secondary | ICD-10-CM | POA: Diagnosis not present

## 2015-07-06 DIAGNOSIS — M65332 Trigger finger, left middle finger: Secondary | ICD-10-CM | POA: Diagnosis not present

## 2015-09-14 DIAGNOSIS — Z23 Encounter for immunization: Secondary | ICD-10-CM | POA: Diagnosis not present

## 2015-10-10 ENCOUNTER — Other Ambulatory Visit: Payer: Self-pay

## 2015-10-10 DIAGNOSIS — Z1231 Encounter for screening mammogram for malignant neoplasm of breast: Secondary | ICD-10-CM

## 2015-11-19 ENCOUNTER — Ambulatory Visit
Admission: RE | Admit: 2015-11-19 | Discharge: 2015-11-19 | Disposition: A | Discharge status: Home / Self Care | Payer: Commercial Managed Care - HMO | Source: Ambulatory Visit

## 2015-11-19 DIAGNOSIS — Z1231 Encounter for screening mammogram for malignant neoplasm of breast: Secondary | ICD-10-CM | POA: Diagnosis not present

## 2015-11-28 DIAGNOSIS — L930 Discoid lupus erythematosus: Secondary | ICD-10-CM | POA: Diagnosis not present

## 2016-01-02 ENCOUNTER — Other Ambulatory Visit (HOSPITAL_COMMUNITY)
Admission: RE | Admit: 2016-01-02 | Discharge: 2016-01-02 | Disposition: A | Discharge status: Home / Self Care | Payer: Commercial Managed Care - HMO | Source: Ambulatory Visit | Attending: Family Medicine | Admitting: Family Medicine

## 2016-01-02 ENCOUNTER — Other Ambulatory Visit: Payer: Self-pay | Admitting: Family Medicine

## 2016-01-02 DIAGNOSIS — I129 Hypertensive chronic kidney disease with stage 1 through stage 4 chronic kidney disease, or unspecified chronic kidney disease: Secondary | ICD-10-CM | POA: Diagnosis not present

## 2016-01-02 DIAGNOSIS — Z1159 Encounter for screening for other viral diseases: Secondary | ICD-10-CM | POA: Diagnosis not present

## 2016-01-02 DIAGNOSIS — Z1211 Encounter for screening for malignant neoplasm of colon: Secondary | ICD-10-CM | POA: Diagnosis not present

## 2016-01-02 DIAGNOSIS — Z0000 Encounter for general adult medical examination without abnormal findings: Secondary | ICD-10-CM | POA: Diagnosis not present

## 2016-01-02 DIAGNOSIS — Z124 Encounter for screening for malignant neoplasm of cervix: Secondary | ICD-10-CM | POA: Insufficient documentation

## 2016-01-02 DIAGNOSIS — L930 Discoid lupus erythematosus: Secondary | ICD-10-CM | POA: Diagnosis not present

## 2016-01-02 DIAGNOSIS — N183 Chronic kidney disease, stage 3 (moderate): Secondary | ICD-10-CM | POA: Diagnosis not present

## 2016-01-02 DIAGNOSIS — K219 Gastro-esophageal reflux disease without esophagitis: Secondary | ICD-10-CM | POA: Diagnosis not present

## 2016-01-02 DIAGNOSIS — J449 Chronic obstructive pulmonary disease, unspecified: Secondary | ICD-10-CM | POA: Diagnosis not present

## 2016-01-04 LAB — CYTOLOGY - PAP

## 2016-01-07 DIAGNOSIS — R768 Other specified abnormal immunological findings in serum: Secondary | ICD-10-CM | POA: Diagnosis not present

## 2016-01-16 ENCOUNTER — Telehealth: Payer: Self-pay | Admitting: Internal Medicine

## 2016-01-16 NOTE — Telephone Encounter (Signed)
msg closed in error and then forwarded back to [facility]

## 2016-01-16 NOTE — Telephone Encounter (Signed)
If they don't cover symbicort 160 then then best next choice is advair 115 - breo is a different device entirely and I'd be happy on helping her train on it but that would require ov

## 2016-01-16 NOTE — Telephone Encounter (Signed)
Called spoke with pt and she prefers to schedule appt with Dr. Melvyn Novas to discuss med changes. appt scheduled for 2/20. Nothing further needed

## 2016-01-16 NOTE — Telephone Encounter (Signed)
Spoke with the pt  She states that [facility] is no longer covered by [facility]  Covered alternatives include Breo, advair or Breo   Pt seen in Jan 2017 and advised f/u PRN  Her PCP was willing to pick alternative, but pt feels more comfortable with MW choosing one, and if she does well with it her PCP can refill   Please advise, thanks!

## 2016-01-28 ENCOUNTER — Ambulatory Visit (INDEPENDENT_AMBULATORY_CARE_PROVIDER_SITE_OTHER): Payer: Commercial Managed Care - HMO | Admitting: Internal Medicine

## 2016-01-28 ENCOUNTER — Encounter: Payer: Self-pay | Admitting: Internal Medicine

## 2016-01-28 VITALS — BP 92/64 | HR 65 | Ht 64.5 in | Wt 178.8 lb

## 2016-01-28 DIAGNOSIS — J449 Chronic obstructive pulmonary disease, unspecified: Secondary | ICD-10-CM

## 2016-01-28 MED ORDER — BUDESONIDE-FORMOTEROL FUMARATE 160-4.5 MCG/ACT IN AERO: INHALATION_SPRAY | RESPIRATORY_TRACT | Status: DC

## 2016-01-28 NOTE — Progress Notes (Signed)
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   History of Present Illness  03/15/2012 f/u ov/Barbara [name] post hosp f/u tapering prednisone down to 20 mg today and 10 mg 4/9 and stop. States fine when on prednisone unless extreme temperatures but even on "fine days" still wakes up and used alb neb. Minimal cough, hoarsness. rec Work on inhaler technique:       Stop symbicort Dulera 200 Take 2 puffs first thing in am and then another 2 puffs about 12 hours later.  Continue protonix 40 mg Take 30-60 min before first meal of the day  For cough use Tramadol as needed Only use your albuterol as a rescue medication(Plan B Albuterol hfa/ventolin vs Plan C nebulizer)      04/14/2012 f/u ov/Barbara [name] cc much better breathing, still dry cough, no need for saba rescue.  Could not tol ppi due to stomach cramping > resolved off. rec Take delsym two tsp every 12 hours and supplement if needed with  tramadol 50 mg up to 1-2 every 4 hours to suppress the urge to cough.     If not improving, start Prednisone 10 mg take  4 each am x 2 days,   2 each am x 2 days,  1 each am x2days and stop Stop spiriva Add pepcid 20 mg one at bedtime Please schedule a follow up office visit in 4 weeks, sooner if needed with pft's bring all meds/inhalers/neb solutions with you    12/23/2013 f/u ov/Barbara [name] re: GOLD II copd Chief Complaint  Patient presents with  . Follow-up    Rov.  Pt c/o SOB with exertion, some nonprod cough.  Pt states "she is doing good".  Pt has quit smoking since lv.  On dulera 200  2bid plus ventolin prn  Up to 4 x daily "I use it expectantly" rec Think of your respiratory medications as multiple steps you can take to control your symptoms and avoid having to go to the ER Plan  A is your maintenance daily no matter what meds: Automatic: dulera 200 2 every 12 hours  Plan B - Backup and only if you can't catch your breath: ?Ventolin up to 2 puffs every 4 hours Plan C only use after you've used plan A and B and still can't catch your breath: use the nebulizer  Plan D(for Doctor):  If you've used A thru C and not doing a lot better    D = call the doctor for evaluation asap Plan E (for ER):  If still not able to catch your breath, even after using your nebulizer up to every 4 hours, go to ER   lable   01/28/2016  f/u ov/Barbara [name] re:  COPD GOLDII / dulera 200  2bid  Chief Complaint  Patient presents with  . Follow-up    Breathing is doing well. She uses albuterol inhaler 3 x per wk on average. She is here today to discuss alternative for [facility].   Not limited by breathing from desired activities    No obvious day to day or daytime variability or assoc chronic cough or cp or chest tightness, subjective wheeze or overt sinus or hb symptoms. No unusual exp hx  or h/o childhood pna/ asthma or knowledge of premature birth.  Sleeping ok without nocturnal  or early am exacerbation  of respiratory  c/o's or need for noct saba. Also denies any obvious fluctuation of symptoms with weather or environmental changes or other aggravating or alleviating factors except as outlined above   Current Medications, Allergies, Complete Past Medical History, Past Surgical History, Family History, and Social History were reviewed in [employer] record.  ROS  The following are not active complaints unless bolded sore throat, dysphagia, dental problems, itching, sneezing,  nasal congestion or excess/ purulent secretions, ear ache,   fever, chills, sweats, unintended wt loss, classically pleuritic or exertional cp, hemoptysis,  orthopnea pnd or leg swelling, presyncope, palpitations, abdominal pain, anorexia, nausea, vomiting, diarrhea  or change in bowel or bladder habits, change in  stools or urine, dysuria,hematuria,  rash, arthralgias, visual complaints, headache, numbness, weakness or ataxia or problems with walking or coordination,  change in mood/affect or memory.              Objective:   Physical Exam  obese  Bf, nad   Wt 172 03/15/2012 > 04/14/2012  176 > 05/24/2012  165 > 09/07/2012  161 >169 09/23/2012 > 12/23/2013  189 > 01/01/2015 185 > 01/28/2016 179   Vital signs reviewed      HEENT:  [place]/AT,  EACs-clear, TMs-wnl, NOSE-clear, THROAT-clear, no lesions, no postnasal drip or exudate noted/ top dentures    NECK:  Supple w/ fair ROM; no JVD; normal carotid impulses w/o bruits; no thyromegaly or nodules palpated; no lymphadenopathy.  RESP  slt distant bilateral bs, no accessory muscle use, no dullness to percussion  CARD:  SB  no m/r/g  , no peripheral edema, pulses intact, no cyanosis or clubbing.  GI:   Soft & nt; nml bowel sounds; no organomegaly or masses detected.  Musco: Warm bil, no deformities or joint swelling noted.   Neuro: alert, no focal deficits noted.    Skin: Warm, no lesions or rashes     Assessment:

## 2016-01-28 NOTE — Assessment & Plan Note (Addendum)
-   Quit smoking 2012     - Spirometry 03/15/2012 FEV1 1.54 (74%) ratio 65    - PFT's 05/24/2012 FEV1  1.57 (69%) with ratio 66 and no better p B2 and DLC0 59    - 01/28/2016 p extensive coaching HFA effectiveness =   100% > change to symbicort 160 2bid due to insurance   I had an extended final summary discussion with the patient reviewing all relevant studies completed to date and  lasting 15 to 20 minutes of a 25 minute visit on the following issues:    Formulary restrictions will be an ongoing challenge for the forseable future and I would be happy to pick an alternative if the pt will first  provide me a list of them but pt  will need to return here for training for any new device that is required eg dpi vs hfa vs respimat.    In meantime we can always provide samples so the patient never runs out of any needed respiratory medications.     I reviewed the Fletcher curve with the patient that basically indicates  if you quit smoking when your best day FEV1 is still well preserved (as is clearly  the case here)  it is highly unlikely you will progress to severe disease and informed the patient there was no medication on the market that has proven to alter the curve/ its downward trajectory  or the likelihood of progression of their disease.  Therefore stopping smoking and maintaining abstinence is the most important aspect of care, not choice of inhalers or for that matter, doctors - pulmonary f/u can be prn  Each maintenance medication was reviewed in detail including most importantly the difference between maintenance and as needed and under what circumstances the prns are to be used.  Please see instructions for details which were reviewed in writing and the patient given a copy.

## 2016-01-28 NOTE — Patient Instructions (Addendum)
Finish dulera and then start symbicort 160 Take 2 puffs first thing in am and then another 2 puffs about 12 hours later.    If you are satisfied with your treatment plan,  let your doctor know and he/she can either refill your medications or you can return here when your prescription runs out.     If in any way you are not 100% satisfied,  please tell us.  If 100% better, tell your friends!  Pulmonary follow up is as needed

## 2016-02-25 ENCOUNTER — Other Ambulatory Visit (HOSPITAL_COMMUNITY): Payer: Self-pay | Admitting: Nurse Practitioner

## 2016-02-25 DIAGNOSIS — B182 Chronic viral hepatitis C: Secondary | ICD-10-CM

## 2016-03-05 ENCOUNTER — Telehealth: Payer: Self-pay | Admitting: Internal Medicine

## 2016-03-05 NOTE — Telephone Encounter (Signed)
Pt had a question about inhaler but came to her on conclusion while we were on the phone & she states she doesn't need to speak to nurse now.

## 2016-03-12 DIAGNOSIS — Z0100 Encounter for examination of eyes and vision without abnormal findings: Secondary | ICD-10-CM | POA: Diagnosis not present

## 2016-03-24 DIAGNOSIS — Z1211 Encounter for screening for malignant neoplasm of colon: Secondary | ICD-10-CM | POA: Diagnosis not present

## 2016-03-24 DIAGNOSIS — B1920 Unspecified viral hepatitis C without hepatic coma: Secondary | ICD-10-CM | POA: Diagnosis not present

## 2016-03-24 DIAGNOSIS — K219 Gastro-esophageal reflux disease without esophagitis: Secondary | ICD-10-CM | POA: Diagnosis not present

## 2016-03-25 DIAGNOSIS — H10021 Other mucopurulent conjunctivitis, right eye: Secondary | ICD-10-CM | POA: Diagnosis not present

## 2016-03-27 ENCOUNTER — Ambulatory Visit (HOSPITAL_COMMUNITY)
Admission: RE | Admit: 2016-03-27 | Discharge: 2016-03-27 | Disposition: A | Discharge status: Home / Self Care | Payer: Commercial Managed Care - HMO | Source: Ambulatory Visit | Attending: Nurse Practitioner | Admitting: Nurse Practitioner

## 2016-03-27 DIAGNOSIS — B1920 Unspecified viral hepatitis C without hepatic coma: Secondary | ICD-10-CM | POA: Diagnosis not present

## 2016-03-27 DIAGNOSIS — B182 Chronic viral hepatitis C: Secondary | ICD-10-CM | POA: Diagnosis not present

## 2016-04-03 DIAGNOSIS — J029 Acute pharyngitis, unspecified: Secondary | ICD-10-CM | POA: Diagnosis not present

## 2016-04-03 DIAGNOSIS — R52 Pain, unspecified: Secondary | ICD-10-CM | POA: Diagnosis not present

## 2016-04-07 DIAGNOSIS — J069 Acute upper respiratory infection, unspecified: Secondary | ICD-10-CM | POA: Diagnosis not present

## 2016-04-29 ENCOUNTER — Other Ambulatory Visit: Payer: Self-pay | Admitting: Gastroenterology

## 2016-04-29 DIAGNOSIS — K5730 Diverticulosis of large intestine without perforation or abscess without bleeding: Secondary | ICD-10-CM | POA: Diagnosis not present

## 2016-04-29 DIAGNOSIS — K219 Gastro-esophageal reflux disease without esophagitis: Secondary | ICD-10-CM | POA: Diagnosis not present

## 2016-04-29 DIAGNOSIS — D126 Benign neoplasm of colon, unspecified: Secondary | ICD-10-CM | POA: Diagnosis not present

## 2016-04-29 DIAGNOSIS — D120 Benign neoplasm of cecum: Secondary | ICD-10-CM | POA: Diagnosis not present

## 2016-04-29 DIAGNOSIS — Z13810 Encounter for screening for upper gastrointestinal disorder: Secondary | ICD-10-CM | POA: Diagnosis not present

## 2016-04-29 DIAGNOSIS — Z1211 Encounter for screening for malignant neoplasm of colon: Secondary | ICD-10-CM | POA: Diagnosis not present

## 2016-05-23 DIAGNOSIS — M65332 Trigger finger, left middle finger: Secondary | ICD-10-CM | POA: Diagnosis not present

## 2016-05-29 DIAGNOSIS — L930 Discoid lupus erythematosus: Secondary | ICD-10-CM | POA: Diagnosis not present

## 2016-06-04 DIAGNOSIS — B182 Chronic viral hepatitis C: Secondary | ICD-10-CM | POA: Diagnosis not present

## 2016-06-04 DIAGNOSIS — K740 Hepatic fibrosis: Secondary | ICD-10-CM | POA: Diagnosis not present

## 2016-06-24 DIAGNOSIS — J449 Chronic obstructive pulmonary disease, unspecified: Secondary | ICD-10-CM | POA: Diagnosis not present

## 2016-06-24 DIAGNOSIS — N183 Chronic kidney disease, stage 3 (moderate): Secondary | ICD-10-CM | POA: Diagnosis not present

## 2016-06-24 DIAGNOSIS — I129 Hypertensive chronic kidney disease with stage 1 through stage 4 chronic kidney disease, or unspecified chronic kidney disease: Secondary | ICD-10-CM | POA: Diagnosis not present

## 2016-06-24 DIAGNOSIS — Z1389 Encounter for screening for other disorder: Secondary | ICD-10-CM | POA: Diagnosis not present

## 2016-07-15 DIAGNOSIS — B182 Chronic viral hepatitis C: Secondary | ICD-10-CM | POA: Diagnosis not present

## 2016-08-12 DIAGNOSIS — B182 Chronic viral hepatitis C: Secondary | ICD-10-CM | POA: Diagnosis not present

## 2016-08-13 DIAGNOSIS — K740 Hepatic fibrosis: Secondary | ICD-10-CM | POA: Diagnosis not present

## 2016-08-13 DIAGNOSIS — B182 Chronic viral hepatitis C: Secondary | ICD-10-CM | POA: Diagnosis not present

## 2016-09-08 DIAGNOSIS — Z23 Encounter for immunization: Secondary | ICD-10-CM | POA: Diagnosis not present

## 2016-09-10 DIAGNOSIS — B1920 Unspecified viral hepatitis C without hepatic coma: Secondary | ICD-10-CM | POA: Diagnosis not present

## 2016-09-10 DIAGNOSIS — B351 Tinea unguium: Secondary | ICD-10-CM | POA: Diagnosis not present

## 2016-09-10 DIAGNOSIS — J441 Chronic obstructive pulmonary disease with (acute) exacerbation: Secondary | ICD-10-CM | POA: Diagnosis not present

## 2016-10-08 ENCOUNTER — Other Ambulatory Visit: Payer: Self-pay | Admitting: Family Medicine

## 2016-10-08 DIAGNOSIS — Z1231 Encounter for screening mammogram for malignant neoplasm of breast: Secondary | ICD-10-CM

## 2016-10-28 DIAGNOSIS — L930 Discoid lupus erythematosus: Secondary | ICD-10-CM | POA: Diagnosis not present

## 2016-10-28 DIAGNOSIS — R202 Paresthesia of skin: Secondary | ICD-10-CM | POA: Diagnosis not present

## 2016-11-03 DIAGNOSIS — B182 Chronic viral hepatitis C: Secondary | ICD-10-CM | POA: Diagnosis not present

## 2016-11-06 DIAGNOSIS — B182 Chronic viral hepatitis C: Secondary | ICD-10-CM | POA: Diagnosis not present

## 2016-11-19 ENCOUNTER — Ambulatory Visit
Admission: RE | Admit: 2016-11-19 | Discharge: 2016-11-19 | Disposition: A | Discharge status: Home / Self Care | Payer: Commercial Managed Care - HMO | Source: Ambulatory Visit | Attending: Family Medicine | Admitting: Family Medicine

## 2016-11-19 DIAGNOSIS — Z1231 Encounter for screening mammogram for malignant neoplasm of breast: Secondary | ICD-10-CM | POA: Diagnosis not present

## 2017-01-05 DIAGNOSIS — Z0000 Encounter for general adult medical examination without abnormal findings: Secondary | ICD-10-CM | POA: Diagnosis not present

## 2017-01-05 DIAGNOSIS — H6123 Impacted cerumen, bilateral: Secondary | ICD-10-CM | POA: Diagnosis not present

## 2017-01-05 DIAGNOSIS — L930 Discoid lupus erythematosus: Secondary | ICD-10-CM | POA: Diagnosis not present

## 2017-01-05 DIAGNOSIS — Z1389 Encounter for screening for other disorder: Secondary | ICD-10-CM | POA: Diagnosis not present

## 2017-01-05 DIAGNOSIS — B1920 Unspecified viral hepatitis C without hepatic coma: Secondary | ICD-10-CM | POA: Diagnosis not present

## 2017-01-20 DIAGNOSIS — J449 Chronic obstructive pulmonary disease, unspecified: Secondary | ICD-10-CM | POA: Diagnosis not present

## 2017-01-20 DIAGNOSIS — E6609 Other obesity due to excess calories: Secondary | ICD-10-CM | POA: Diagnosis not present

## 2017-01-20 DIAGNOSIS — B351 Tinea unguium: Secondary | ICD-10-CM | POA: Diagnosis not present

## 2017-01-20 DIAGNOSIS — K219 Gastro-esophageal reflux disease without esophagitis: Secondary | ICD-10-CM | POA: Diagnosis not present

## 2017-01-20 DIAGNOSIS — I10 Essential (primary) hypertension: Secondary | ICD-10-CM | POA: Diagnosis not present

## 2017-01-28 ENCOUNTER — Other Ambulatory Visit: Payer: Self-pay | Admitting: Family Medicine

## 2017-01-28 ENCOUNTER — Ambulatory Visit
Admission: RE | Admit: 2017-01-28 | Discharge: 2017-01-28 | Disposition: A | Discharge status: Home / Self Care | Payer: Medicare HMO | Source: Ambulatory Visit | Attending: Family Medicine | Admitting: Family Medicine

## 2017-01-28 DIAGNOSIS — M25562 Pain in left knee: Secondary | ICD-10-CM

## 2017-01-28 DIAGNOSIS — M1712 Unilateral primary osteoarthritis, left knee: Secondary | ICD-10-CM | POA: Diagnosis not present

## 2017-02-03 DIAGNOSIS — M1712 Unilateral primary osteoarthritis, left knee: Secondary | ICD-10-CM | POA: Diagnosis not present

## 2017-02-03 DIAGNOSIS — M25562 Pain in left knee: Secondary | ICD-10-CM | POA: Diagnosis not present

## 2017-02-04 DIAGNOSIS — M1712 Unilateral primary osteoarthritis, left knee: Secondary | ICD-10-CM | POA: Diagnosis not present

## 2017-02-04 DIAGNOSIS — M25562 Pain in left knee: Secondary | ICD-10-CM | POA: Diagnosis not present

## 2017-02-10 DIAGNOSIS — M25562 Pain in left knee: Secondary | ICD-10-CM | POA: Diagnosis not present

## 2017-02-10 DIAGNOSIS — M1712 Unilateral primary osteoarthritis, left knee: Secondary | ICD-10-CM | POA: Diagnosis not present

## 2017-02-12 DIAGNOSIS — M25562 Pain in left knee: Secondary | ICD-10-CM | POA: Diagnosis not present

## 2017-02-12 DIAGNOSIS — M1712 Unilateral primary osteoarthritis, left knee: Secondary | ICD-10-CM | POA: Diagnosis not present

## 2017-02-19 DIAGNOSIS — M1712 Unilateral primary osteoarthritis, left knee: Secondary | ICD-10-CM | POA: Diagnosis not present

## 2017-02-19 DIAGNOSIS — M25562 Pain in left knee: Secondary | ICD-10-CM | POA: Diagnosis not present

## 2017-02-24 DIAGNOSIS — M25562 Pain in left knee: Secondary | ICD-10-CM | POA: Diagnosis not present

## 2017-02-24 DIAGNOSIS — M1712 Unilateral primary osteoarthritis, left knee: Secondary | ICD-10-CM | POA: Diagnosis not present

## 2017-02-25 DIAGNOSIS — E669 Obesity, unspecified: Secondary | ICD-10-CM | POA: Diagnosis not present

## 2017-02-25 DIAGNOSIS — G8929 Other chronic pain: Secondary | ICD-10-CM | POA: Diagnosis not present

## 2017-02-25 DIAGNOSIS — M25562 Pain in left knee: Secondary | ICD-10-CM | POA: Diagnosis not present

## 2017-02-25 DIAGNOSIS — M25561 Pain in right knee: Secondary | ICD-10-CM | POA: Diagnosis not present

## 2017-02-26 DIAGNOSIS — M25562 Pain in left knee: Secondary | ICD-10-CM | POA: Diagnosis not present

## 2017-02-26 DIAGNOSIS — M1712 Unilateral primary osteoarthritis, left knee: Secondary | ICD-10-CM | POA: Diagnosis not present

## 2017-03-03 DIAGNOSIS — M1712 Unilateral primary osteoarthritis, left knee: Secondary | ICD-10-CM | POA: Diagnosis not present

## 2017-03-03 DIAGNOSIS — M25562 Pain in left knee: Secondary | ICD-10-CM | POA: Diagnosis not present

## 2017-03-05 DIAGNOSIS — M25562 Pain in left knee: Secondary | ICD-10-CM | POA: Diagnosis not present

## 2017-03-05 DIAGNOSIS — M1712 Unilateral primary osteoarthritis, left knee: Secondary | ICD-10-CM | POA: Diagnosis not present

## 2017-03-10 DIAGNOSIS — M25562 Pain in left knee: Secondary | ICD-10-CM | POA: Diagnosis not present

## 2017-03-10 DIAGNOSIS — M1712 Unilateral primary osteoarthritis, left knee: Secondary | ICD-10-CM | POA: Diagnosis not present

## 2017-03-11 DIAGNOSIS — M25562 Pain in left knee: Secondary | ICD-10-CM | POA: Diagnosis not present

## 2017-03-11 DIAGNOSIS — M1712 Unilateral primary osteoarthritis, left knee: Secondary | ICD-10-CM | POA: Diagnosis not present

## 2017-03-19 ENCOUNTER — Other Ambulatory Visit: Payer: Self-pay | Admitting: Physician Assistant

## 2017-03-19 ENCOUNTER — Ambulatory Visit
Admission: RE | Admit: 2017-03-19 | Discharge: 2017-03-19 | Disposition: A | Discharge status: Home / Self Care | Payer: Medicare HMO | Source: Ambulatory Visit | Attending: Physician Assistant | Admitting: Physician Assistant

## 2017-03-19 DIAGNOSIS — J441 Chronic obstructive pulmonary disease with (acute) exacerbation: Secondary | ICD-10-CM

## 2017-03-19 DIAGNOSIS — R0602 Shortness of breath: Secondary | ICD-10-CM | POA: Diagnosis not present

## 2017-04-11 DIAGNOSIS — H2523 Age-related cataract, morgagnian type, bilateral: Secondary | ICD-10-CM | POA: Diagnosis not present

## 2017-04-11 DIAGNOSIS — Z0100 Encounter for examination of eyes and vision without abnormal findings: Secondary | ICD-10-CM | POA: Diagnosis not present

## 2017-04-11 DIAGNOSIS — I10 Essential (primary) hypertension: Secondary | ICD-10-CM | POA: Diagnosis not present

## 2017-05-26 DIAGNOSIS — B351 Tinea unguium: Secondary | ICD-10-CM | POA: Diagnosis not present

## 2017-05-26 DIAGNOSIS — Z1389 Encounter for screening for other disorder: Secondary | ICD-10-CM | POA: Diagnosis not present

## 2017-05-26 DIAGNOSIS — I10 Essential (primary) hypertension: Secondary | ICD-10-CM | POA: Diagnosis not present

## 2017-05-26 DIAGNOSIS — J449 Chronic obstructive pulmonary disease, unspecified: Secondary | ICD-10-CM | POA: Diagnosis not present

## 2017-07-03 DIAGNOSIS — L259 Unspecified contact dermatitis, unspecified cause: Secondary | ICD-10-CM | POA: Diagnosis not present

## 2017-07-22 DIAGNOSIS — M25511 Pain in right shoulder: Secondary | ICD-10-CM | POA: Diagnosis not present

## 2017-07-22 DIAGNOSIS — R21 Rash and other nonspecific skin eruption: Secondary | ICD-10-CM | POA: Diagnosis not present

## 2017-08-13 DIAGNOSIS — R202 Paresthesia of skin: Secondary | ICD-10-CM | POA: Diagnosis not present

## 2017-08-13 DIAGNOSIS — L930 Discoid lupus erythematosus: Secondary | ICD-10-CM | POA: Diagnosis not present

## 2017-10-20 ENCOUNTER — Other Ambulatory Visit: Payer: Self-pay | Admitting: Family Medicine

## 2017-10-20 DIAGNOSIS — Z139 Encounter for screening, unspecified: Secondary | ICD-10-CM

## 2017-11-13 DIAGNOSIS — J441 Chronic obstructive pulmonary disease with (acute) exacerbation: Secondary | ICD-10-CM | POA: Diagnosis not present

## 2017-11-13 DIAGNOSIS — K219 Gastro-esophageal reflux disease without esophagitis: Secondary | ICD-10-CM | POA: Diagnosis not present

## 2017-11-13 DIAGNOSIS — R0602 Shortness of breath: Secondary | ICD-10-CM | POA: Diagnosis not present

## 2017-11-18 ENCOUNTER — Ambulatory Visit: Payer: Medicare HMO

## 2017-12-17 ENCOUNTER — Other Ambulatory Visit: Payer: Self-pay | Admitting: Gastroenterology

## 2017-12-17 DIAGNOSIS — R1310 Dysphagia, unspecified: Secondary | ICD-10-CM | POA: Diagnosis not present

## 2017-12-17 DIAGNOSIS — K219 Gastro-esophageal reflux disease without esophagitis: Secondary | ICD-10-CM | POA: Diagnosis not present

## 2017-12-22 ENCOUNTER — Ambulatory Visit
Admission: RE | Admit: 2017-12-22 | Discharge: 2017-12-22 | Disposition: A | Discharge status: Home / Self Care | Payer: Medicare HMO | Source: Ambulatory Visit | Attending: Family Medicine | Admitting: Family Medicine

## 2017-12-22 DIAGNOSIS — L930 Discoid lupus erythematosus: Secondary | ICD-10-CM | POA: Diagnosis not present

## 2017-12-22 DIAGNOSIS — Z139 Encounter for screening, unspecified: Secondary | ICD-10-CM

## 2017-12-22 DIAGNOSIS — Z0000 Encounter for general adult medical examination without abnormal findings: Secondary | ICD-10-CM | POA: Diagnosis not present

## 2017-12-22 DIAGNOSIS — Z1389 Encounter for screening for other disorder: Secondary | ICD-10-CM | POA: Diagnosis not present

## 2017-12-22 DIAGNOSIS — K219 Gastro-esophageal reflux disease without esophagitis: Secondary | ICD-10-CM | POA: Diagnosis not present

## 2017-12-22 DIAGNOSIS — N183 Chronic kidney disease, stage 3 (moderate): Secondary | ICD-10-CM | POA: Diagnosis not present

## 2017-12-22 DIAGNOSIS — Z1231 Encounter for screening mammogram for malignant neoplasm of breast: Secondary | ICD-10-CM | POA: Diagnosis not present

## 2017-12-22 DIAGNOSIS — J449 Chronic obstructive pulmonary disease, unspecified: Secondary | ICD-10-CM | POA: Diagnosis not present

## 2017-12-22 DIAGNOSIS — I129 Hypertensive chronic kidney disease with stage 1 through stage 4 chronic kidney disease, or unspecified chronic kidney disease: Secondary | ICD-10-CM | POA: Diagnosis not present

## 2017-12-23 ENCOUNTER — Other Ambulatory Visit: Payer: Medicare HMO

## 2017-12-25 ENCOUNTER — Ambulatory Visit
Admission: RE | Admit: 2017-12-25 | Discharge: 2017-12-25 | Disposition: A | Discharge status: Home / Self Care | Payer: Medicare HMO | Source: Ambulatory Visit | Attending: Gastroenterology | Admitting: Gastroenterology

## 2017-12-25 DIAGNOSIS — K219 Gastro-esophageal reflux disease without esophagitis: Secondary | ICD-10-CM

## 2018-01-12 DIAGNOSIS — L930 Discoid lupus erythematosus: Secondary | ICD-10-CM | POA: Diagnosis not present

## 2018-01-12 DIAGNOSIS — R202 Paresthesia of skin: Secondary | ICD-10-CM | POA: Diagnosis not present

## 2018-03-16 DIAGNOSIS — L930 Discoid lupus erythematosus: Secondary | ICD-10-CM | POA: Diagnosis not present

## 2018-03-16 DIAGNOSIS — N183 Chronic kidney disease, stage 3 (moderate): Secondary | ICD-10-CM | POA: Diagnosis not present

## 2018-03-16 DIAGNOSIS — K219 Gastro-esophageal reflux disease without esophagitis: Secondary | ICD-10-CM | POA: Diagnosis not present

## 2018-03-16 DIAGNOSIS — B1920 Unspecified viral hepatitis C without hepatic coma: Secondary | ICD-10-CM | POA: Diagnosis not present

## 2018-05-07 DIAGNOSIS — Z0100 Encounter for examination of eyes and vision without abnormal findings: Secondary | ICD-10-CM | POA: Diagnosis not present

## 2018-05-07 DIAGNOSIS — I10 Essential (primary) hypertension: Secondary | ICD-10-CM | POA: Diagnosis not present

## 2018-06-22 DIAGNOSIS — L930 Discoid lupus erythematosus: Secondary | ICD-10-CM | POA: Diagnosis not present

## 2018-06-22 DIAGNOSIS — N183 Chronic kidney disease, stage 3 (moderate): Secondary | ICD-10-CM | POA: Diagnosis not present

## 2018-06-22 DIAGNOSIS — I129 Hypertensive chronic kidney disease with stage 1 through stage 4 chronic kidney disease, or unspecified chronic kidney disease: Secondary | ICD-10-CM | POA: Diagnosis not present

## 2018-06-22 DIAGNOSIS — J449 Chronic obstructive pulmonary disease, unspecified: Secondary | ICD-10-CM | POA: Diagnosis not present

## 2018-08-17 DIAGNOSIS — L930 Discoid lupus erythematosus: Secondary | ICD-10-CM | POA: Diagnosis not present

## 2018-09-13 DIAGNOSIS — H5789 Other specified disorders of eye and adnexa: Secondary | ICD-10-CM | POA: Diagnosis not present

## 2018-10-13 ENCOUNTER — Other Ambulatory Visit: Payer: Self-pay | Admitting: Family Medicine

## 2018-10-13 DIAGNOSIS — Z1231 Encounter for screening mammogram for malignant neoplasm of breast: Secondary | ICD-10-CM

## 2018-11-16 DIAGNOSIS — L853 Xerosis cutis: Secondary | ICD-10-CM | POA: Diagnosis not present

## 2018-11-16 DIAGNOSIS — L930 Discoid lupus erythematosus: Secondary | ICD-10-CM | POA: Diagnosis not present

## 2018-12-28 ENCOUNTER — Ambulatory Visit
Admission: RE | Admit: 2018-12-28 | Discharge: 2018-12-28 | Disposition: A | Discharge status: Home / Self Care | Payer: Medicare HMO | Source: Ambulatory Visit | Attending: Family Medicine | Admitting: Family Medicine

## 2018-12-28 DIAGNOSIS — Z1231 Encounter for screening mammogram for malignant neoplasm of breast: Secondary | ICD-10-CM | POA: Diagnosis not present

## 2019-01-04 DIAGNOSIS — Z0000 Encounter for general adult medical examination without abnormal findings: Secondary | ICD-10-CM | POA: Diagnosis not present

## 2019-01-04 DIAGNOSIS — J449 Chronic obstructive pulmonary disease, unspecified: Secondary | ICD-10-CM | POA: Diagnosis not present

## 2019-01-04 DIAGNOSIS — L930 Discoid lupus erythematosus: Secondary | ICD-10-CM | POA: Diagnosis not present

## 2019-01-04 DIAGNOSIS — I129 Hypertensive chronic kidney disease with stage 1 through stage 4 chronic kidney disease, or unspecified chronic kidney disease: Secondary | ICD-10-CM | POA: Diagnosis not present

## 2019-01-04 DIAGNOSIS — K219 Gastro-esophageal reflux disease without esophagitis: Secondary | ICD-10-CM | POA: Diagnosis not present

## 2019-01-04 DIAGNOSIS — Z23 Encounter for immunization: Secondary | ICD-10-CM | POA: Diagnosis not present

## 2019-01-04 DIAGNOSIS — N183 Chronic kidney disease, stage 3 (moderate): Secondary | ICD-10-CM | POA: Diagnosis not present

## 2019-01-04 DIAGNOSIS — Z1389 Encounter for screening for other disorder: Secondary | ICD-10-CM | POA: Diagnosis not present

## 2019-05-24 DIAGNOSIS — L930 Discoid lupus erythematosus: Secondary | ICD-10-CM | POA: Diagnosis not present

## 2019-05-25 DIAGNOSIS — L930 Discoid lupus erythematosus: Secondary | ICD-10-CM | POA: Diagnosis not present

## 2019-06-28 DIAGNOSIS — H524 Presbyopia: Secondary | ICD-10-CM | POA: Diagnosis not present

## 2019-06-28 DIAGNOSIS — Z0100 Encounter for examination of eyes and vision without abnormal findings: Secondary | ICD-10-CM | POA: Diagnosis not present

## 2019-07-05 DIAGNOSIS — L930 Discoid lupus erythematosus: Secondary | ICD-10-CM | POA: Diagnosis not present

## 2019-07-05 DIAGNOSIS — I129 Hypertensive chronic kidney disease with stage 1 through stage 4 chronic kidney disease, or unspecified chronic kidney disease: Secondary | ICD-10-CM | POA: Diagnosis not present

## 2019-07-05 DIAGNOSIS — H269 Unspecified cataract: Secondary | ICD-10-CM | POA: Diagnosis not present

## 2019-07-05 DIAGNOSIS — N183 Chronic kidney disease, stage 3 (moderate): Secondary | ICD-10-CM | POA: Diagnosis not present

## 2019-07-05 DIAGNOSIS — J449 Chronic obstructive pulmonary disease, unspecified: Secondary | ICD-10-CM | POA: Diagnosis not present

## 2019-08-24 DIAGNOSIS — Z23 Encounter for immunization: Secondary | ICD-10-CM | POA: Diagnosis not present

## 2019-10-20 DIAGNOSIS — H269 Unspecified cataract: Secondary | ICD-10-CM | POA: Diagnosis not present

## 2019-10-20 DIAGNOSIS — J449 Chronic obstructive pulmonary disease, unspecified: Secondary | ICD-10-CM | POA: Diagnosis not present

## 2019-10-20 DIAGNOSIS — M1611 Unilateral primary osteoarthritis, right hip: Secondary | ICD-10-CM | POA: Diagnosis not present

## 2019-10-20 DIAGNOSIS — I129 Hypertensive chronic kidney disease with stage 1 through stage 4 chronic kidney disease, or unspecified chronic kidney disease: Secondary | ICD-10-CM | POA: Diagnosis not present

## 2019-10-20 DIAGNOSIS — J441 Chronic obstructive pulmonary disease with (acute) exacerbation: Secondary | ICD-10-CM | POA: Diagnosis not present

## 2019-10-20 DIAGNOSIS — N183 Chronic kidney disease, stage 3 unspecified: Secondary | ICD-10-CM | POA: Diagnosis not present

## 2019-11-24 ENCOUNTER — Other Ambulatory Visit: Payer: Self-pay | Admitting: Family Medicine

## 2019-11-24 DIAGNOSIS — Z1231 Encounter for screening mammogram for malignant neoplasm of breast: Secondary | ICD-10-CM

## 2020-01-05 ENCOUNTER — Other Ambulatory Visit: Payer: Self-pay

## 2020-01-05 ENCOUNTER — Emergency Department (HOSPITAL_COMMUNITY): Payer: Medicare HMO

## 2020-01-05 ENCOUNTER — Emergency Department (HOSPITAL_COMMUNITY)
Admission: EM | Admit: 2020-01-05 | Discharge: 2020-01-06 | Disposition: A | Discharge status: Home / Self Care | Payer: Medicare HMO | Attending: Emergency Medicine | Admitting: Emergency Medicine

## 2020-01-05 ENCOUNTER — Encounter (HOSPITAL_COMMUNITY): Payer: Self-pay

## 2020-01-05 DIAGNOSIS — Z79899 Other long term (current) drug therapy: Secondary | ICD-10-CM | POA: Diagnosis not present

## 2020-01-05 DIAGNOSIS — R270 Ataxia, unspecified: Secondary | ICD-10-CM | POA: Diagnosis not present

## 2020-01-05 DIAGNOSIS — Z20822 Contact with and (suspected) exposure to covid-19: Secondary | ICD-10-CM | POA: Diagnosis not present

## 2020-01-05 DIAGNOSIS — R42 Dizziness and giddiness: Secondary | ICD-10-CM | POA: Diagnosis not present

## 2020-01-05 DIAGNOSIS — I10 Essential (primary) hypertension: Secondary | ICD-10-CM | POA: Diagnosis not present

## 2020-01-05 DIAGNOSIS — R519 Headache, unspecified: Secondary | ICD-10-CM | POA: Diagnosis not present

## 2020-01-05 DIAGNOSIS — Z87891 Personal history of nicotine dependence: Secondary | ICD-10-CM | POA: Diagnosis not present

## 2020-01-05 DIAGNOSIS — H538 Other visual disturbances: Secondary | ICD-10-CM | POA: Insufficient documentation

## 2020-01-05 DIAGNOSIS — J449 Chronic obstructive pulmonary disease, unspecified: Secondary | ICD-10-CM | POA: Insufficient documentation

## 2020-01-05 LAB — CBC WITH DIFFERENTIAL/PLATELET
Abs Immature Granulocytes: 0.02 10*3/uL (ref 0.00–0.07)
Basophils Absolute: 0 10*3/uL (ref 0.0–0.1)
Basophils Relative: 0 %
Eosinophils Absolute: 0 10*3/uL (ref 0.0–0.5)
Eosinophils Relative: 1 %
HCT: 41.6 % (ref 36.0–46.0)
Hemoglobin: 13.3 g/dL (ref 12.0–15.0)
Immature Granulocytes: 0 %
Lymphocytes Relative: 19 %
Lymphs Abs: 1.6 10*3/uL (ref 0.7–4.0)
MCH: 29.9 pg (ref 26.0–34.0)
MCHC: 32 g/dL (ref 30.0–36.0)
MCV: 93.5 fL (ref 80.0–100.0)
Monocytes Absolute: 0.6 10*3/uL (ref 0.1–1.0)
Monocytes Relative: 7 %
Neutro Abs: 6.4 10*3/uL (ref 1.7–7.7)
Neutrophils Relative %: 73 %
Platelets: 145 10*3/uL — ABNORMAL LOW (ref 150–400)
RBC: 4.45 MIL/uL (ref 3.87–5.11)
RDW: 13.3 % (ref 11.5–15.5)
WBC: 8.6 10*3/uL (ref 4.0–10.5)
nRBC: 0 % (ref 0.0–0.2)

## 2020-01-05 LAB — COMPREHENSIVE METABOLIC PANEL
ALT: 16 U/L (ref 0–44)
AST: 24 U/L (ref 15–41)
Albumin: 4.1 g/dL (ref 3.5–5.0)
Alkaline Phosphatase: 60 U/L (ref 38–126)
Anion gap: 10 (ref 5–15)
BUN: 15 mg/dL (ref 8–23)
CO2: 28 mmol/L (ref 22–32)
Calcium: 9.1 mg/dL (ref 8.9–10.3)
Chloride: 100 mmol/L (ref 98–111)
Creatinine, Ser: 0.9 mg/dL (ref 0.44–1.00)
GFR calc Af Amer: >60 mL/min (ref >60)
GFR calc non Af Amer: >60 mL/min (ref >60)
Glucose, Bld: 117 mg/dL — ABNORMAL HIGH (ref 70–99)
Potassium: 3.4 mmol/L — ABNORMAL LOW (ref 3.5–5.1)
Sodium: 138 mmol/L (ref 135–145)
Total Bilirubin: 0.4 mg/dL (ref 0.3–1.2)
Total Protein: 8 g/dL (ref 6.5–8.1)

## 2020-01-05 LAB — POC SARS CORONAVIRUS 2 AG -  ED: SARS Coronavirus 2 Ag: NEGATIVE

## 2020-01-05 MED ORDER — MECLIZINE HCL 25 MG PO TABS
25.0000 mg | ORAL_TABLET | Freq: Once | ORAL | Status: AC
  Administered 2020-01-05: 25 mg via ORAL
  Filled 2020-01-05: qty 1

## 2020-01-05 NOTE — ED Notes (Signed)
Patient transported to CT 

## 2020-01-05 NOTE — ED Provider Notes (Addendum)
[facility] DEPT Provider Note   CSN: [id number] Arrival date & time: 01/05/20  2009     History Chief Complaint  Patient presents with  . Dizziness    Barbara [name] is a 69 y.o. female.  Patient with acute onset vertigo dizziness at about 3:30 in the morning.  Hit her suddenly.  Patient denies any upper extremity or lower extremity weakness.  Was no speech problems but was concerned about a little bit of blurry vision.  Right-sided headache.  Blurry vision and headaches been intermittent for 2 days but the symptoms just started 330 this morning.  They are still there if she gets up and walks around associated with some room spinning.  Patient's past medical history is significant for hypertension COPD.  Patient's blood pressure on arrival was 144/87.  Patient denies any fevers or any upper respiratory symptoms.        Past Medical History:  Diagnosis Date  . Arthritis    right knee, neck  . Asthma   . COPD (chronic obstructive pulmonary disease) ([place])   . Hypertension   . Mouth trouble    infection from tooth that was pulled approx. mid Mar. 2013    Patient Active Problem List   Diagnosis Date Noted  . Chest pain 09/23/2012  . Cough 04/14/2012  . COPD with acute exacerbation ([place]) 10/21/2011  . Nicotine abuse 10/21/2011  . Hypertension   . COPD GOLD II     Past Surgical History:  Procedure Laterality Date  . ABDOMINAL HYSTERECTOMY    . SHOULDER SURGERY  in age 53's   cyst removed from [place] socket of left shoulder  . TONSILLECTOMY  age 48  . TUBAL LIGATION       OB History   No obstetric history on file.     Family History  Problem Relation Age of Onset  . Dementia Mother   . Kidney disease Mother   . Mental illness Brother   . Breast cancer Neg Hx     Social History   Tobacco Use  . Smoking status: Former Smoker    Packs/day: 1.00    Years: 45.00    Pack years: 45.00    Types: Cigarettes    Quit date:  10/27/2011    Years since quitting: 8.1  . Smokeless tobacco: Never Used  Substance Use Topics  . Alcohol use: No  . Drug use: No    Home Medications Prior to Admission medications   Medication Sig Start Date End Date Taking? Authorizing Provider  albuterol (PROVENTIL HFA;VENTOLIN HFA) 108 (90 BASE) MCG/ACT inhaler Inhale 2 puffs into the lungs every 6 (six) hours as needed. For shortness of breath   Yes [provider]  albuterol (PROVENTIL) (2.5 MG/3ML) 0.083% nebulizer solution Take 3 mLs (2.5 mg total) by nebulization every 6 (six) hours as needed. Breathing 09/26/12  Yes Theodis Blaze, MD  amLODipine (NORVASC) 10 MG tablet Take 10 mg by mouth daily.    Yes [provider]  budesonide-formoterol (SYMBICORT) 160-4.5 MCG/ACT inhaler Take 2 puffs first thing in am and then another 2 puffs about 12 hours later. 01/28/16  Yes Tanda Rockers, MD  Calcium Carbonate-Vit D-Min (CALCIUM 1200 PO) Take 1 tablet by mouth daily.   Yes [provider]  clobetasol cream (TEMOVATE) [id number] % Apply 1 application topically 2 (two) times daily as needed (Rash).    Yes [provider]  Cod Liver Oil CAPS Take 1 capsule by mouth daily.  Yes [provider]  famotidine (PEPCID) 20 MG tablet Take 20 mg by mouth at bedtime.  04/14/12 01/05/20 Yes Tanda Rockers, MD  hydrochlorothiazide (HYDRODIURIL) 25 MG tablet Take 25 mg by mouth daily.    Yes [provider]  hydroxychloroquine (PLAQUENIL) 200 MG tablet Take 200 mg by mouth daily as needed (Lupus).    Yes [provider]  magnesium gluconate (MAGONATE) 500 MG tablet Take 500 mg by mouth daily.   Yes [provider]  Omega-3 Fatty Acids (FISH OIL) 1200 MG CAPS Take 1 capsule by mouth daily.   Yes [provider]  vitamin D, CHOLECALCIFEROL, 400 UNITS tablet Take 400 Units by mouth daily.   Yes [provider]  Dextromethorphan-Guaifenesin 60-1200 MG per 12 hr tablet Take 1  tablet by mouth every 12 (twelve) hours. Patient not taking: Reported on 01/05/2020 09/26/12   Theodis Blaze, MD    Allergies    Nsaids, Clonidine derivatives, Codeine, Lisinopril, Penicillins, Vicodin [hydrocodone-acetaminophen], and Procaine  Review of Systems   Review of Systems  Constitutional: Negative for chills and fever.  HENT: Negative for congestion, ear pain, hearing loss, rhinorrhea and sore throat.   Eyes: Positive for visual disturbance.  Respiratory: Negative for cough and shortness of breath.   Cardiovascular: Negative for chest pain and leg swelling.  Gastrointestinal: Negative for abdominal pain, diarrhea, nausea and vomiting.  Genitourinary: Negative for dysuria.  Musculoskeletal: Negative for back pain and neck pain.  Skin: Negative for rash.  Neurological: Positive for dizziness and headaches. Negative for light-headedness.  Hematological: Does not bruise/bleed easily.  Psychiatric/Behavioral: Negative for confusion.    Physical Exam Updated Vital Signs BP 127/82   Pulse (!) 59   Temp 98.5 F (36.9 C) (Oral)   Resp 20   SpO2 100%   Physical Exam Vitals and nursing note reviewed.  Constitutional:      General: She is not in acute distress.    Appearance: Normal appearance. She is well-developed.  HENT:     Head: Normocephalic and atraumatic.  Eyes:     Extraocular Movements: Extraocular movements intact.     Conjunctiva/sclera: Conjunctivae normal.     Pupils: Pupils are equal, round, and reactive to light.  Cardiovascular:     Rate and Rhythm: Normal rate and regular rhythm.     Heart sounds: No murmur.  Pulmonary:     Effort: Pulmonary effort is normal. No respiratory distress.     Breath sounds: Normal breath sounds.  Abdominal:     Palpations: Abdomen is soft.     Tenderness: There is no abdominal tenderness.  Musculoskeletal:        General: No swelling. Normal range of motion.     Cervical back: Normal range of motion and neck supple.    Skin:    General: Skin is warm and dry.     Capillary Refill: Capillary refill takes less than 2 seconds.  Neurological:     General: No focal deficit present.     Mental Status: She is alert and oriented to person, place, and time.     Cranial Nerves: No cranial nerve deficit.     Sensory: No sensory deficit.     Motor: No weakness.     Comments: Patient does have reproducible vertigo with movement of the head left to right.     ED Results / Procedures / Treatments   Labs (all labs ordered are listed, but only abnormal results are displayed) Labs Reviewed  COMPREHENSIVE  METABOLIC PANEL - Abnormal; Notable for the following components:      Result Value   Potassium 3.4 (*)    Glucose, Bld 117 (*)    All other components within normal limits  CBC WITH DIFFERENTIAL/PLATELET - Abnormal; Notable for the following components:   Platelets 145 (*)    All other components within normal limits  SARS CORONAVIRUS 2 (TAT 6-24 HRS)    EKG EKG Interpretation  Date/Time:  Thursday January 05 2020 20:19:45 EST Ventricular Rate:  76 PR Interval:    QRS Duration: 84 QT Interval:  406 QTC Calculation: 457 R Axis:   44 Text Interpretation: Sinus rhythm Left atrial enlargement Abnrm T, consider ischemia, anterolateral lds Confirmed by Fredia Sorrow [phone]) on 01/05/2020 8:42:23 PM   Radiology CT Head Wo Contrast  Result Date: 01/05/2020 CLINICAL DATA:  Dizziness EXAM: CT HEAD WITHOUT CONTRAST TECHNIQUE: Contiguous axial images were obtained from the base of the skull through the vertex without intravenous contrast. COMPARISON:  None. FINDINGS: Brain: Chronic microvascular disease throughout the deep white matter. No acute intracranial abnormality. Specifically, no hemorrhage, hydrocephalus, mass lesion, acute infarction, or significant intracranial injury. Vascular: No hyperdense vessel or unexpected calcification. Skull: No acute calvarial abnormality. Sinuses/Orbits: Visualized paranasal  sinuses and mastoids clear. Orbital soft tissues unremarkable. Other: None IMPRESSION: Chronic microvascular disease.  No acute intracranial abnormality. Electronically Signed   By: Rolm Baptise M.D.   On: 01/05/2020 21:47    Procedures Procedures (including critical care time)  Medications Ordered in ED Medications - No data to display  ED Course  I have reviewed the triage vital signs and the nursing notes.  Pertinent labs & imaging results that were available during my care of the patient were reviewed by me and considered in my medical decision making (see chart for details).    MDM Rules/Calculators/A&P                      CT head without any acute findings.  Patient's exam consistent with vertigo of unknown cause.  Patient's labs without significant abnormalities.  Blood pressure here improved significantly without any intervention.  Feel patient needs MRI brain to rule out stroke.  We will see about transferring her to [facility] tonight to have MRI done option would be to keep her overnight to have MRI done here first thing in the morning.  Patient without symptoms concerning for COVID-19 infection but tier 3 testing done.   Spoke with charge nurse here decided we would do actually the point-of-care Covid test will make arrangements to get her over to [facility] spoke to ED physician over at [facility] patient will go there to get MRI which I have ordered.  Dr. Carlyle Basques is the ED physician.  If patient's MRI is negative for stroke she can be discharged home with its positive they will need to talk to neurology.  Final Clinical Impression(s) / ED Diagnoses Final diagnoses:  Vertigo    Rx / DC Orders ED Discharge Orders    None       Fredia Sorrow, MD 01/05/20 [id number]    Fredia Sorrow, MD 01/05/20 [id number]

## 2020-01-05 NOTE — ED Triage Notes (Signed)
Pt reports dizziness since about 0330 this morning. Denies any unilateral weakness. No slurred speech or confusion. She does report a R sided headache and some blurry vision that has been intermittent for 2 days. Equal grip noted. No facial droop.

## 2020-01-06 ENCOUNTER — Emergency Department (HOSPITAL_COMMUNITY): Payer: Medicare HMO

## 2020-01-06 ENCOUNTER — Encounter (HOSPITAL_COMMUNITY): Payer: Self-pay | Admitting: *Deleted

## 2020-01-06 DIAGNOSIS — R270 Ataxia, unspecified: Secondary | ICD-10-CM | POA: Diagnosis not present

## 2020-01-06 LAB — URINALYSIS, ROUTINE W REFLEX MICROSCOPIC
Bacteria, UA: NONE SEEN
Bilirubin Urine: NEGATIVE
Glucose, UA: NEGATIVE mg/dL
Hgb urine dipstick: NEGATIVE
Ketones, ur: NEGATIVE mg/dL
Nitrite: NEGATIVE
Protein, ur: NEGATIVE mg/dL
Specific Gravity, Urine: 1.01 (ref 1.005–1.030)
pH: 6 (ref 5.0–8.0)

## 2020-01-06 MED ORDER — ONDANSETRON 4 MG PO TBDP: 4.0000 mg | ORAL_TABLET | Freq: Four times a day (QID) | ORAL | 0 refills | Status: DC | PRN

## 2020-01-06 MED ORDER — MECLIZINE HCL 25 MG PO TABS: 25.0000 mg | ORAL_TABLET | Freq: Three times a day (TID) | ORAL | 0 refills | Status: DC | PRN

## 2020-01-06 NOTE — ED Notes (Signed)
Off unit, going for MRI

## 2020-01-06 NOTE — Discharge Instructions (Signed)
Your labs, urine and MRI today showed no acute abnormality.  You are not having a stroke.

## 2020-01-06 NOTE — ED Provider Notes (Signed)
1:40 AM  Pt here for dizziness.  Transferred from [place] for MRI of the brain to rule out stroke.  Received meclizine prior to arrival.  Labs unremarkable.  Urine shows no obvious infection.  Patient an MRI prior to my evaluation.  Will reassess when she has returned.  2:45 AM  Pt's MRI shows no acute abnormality.  Patient reports still having vertigo whenever she moves.  She is unsure of meclizine given to her earlier has helped that she has been lying still.  Will p.o. challenge and attempt to ambulate in the ED.  3:20 AM  Pt has been able to ambulate with a steady gait.  Still feeling symptomatic but states feels that her symptoms are not so severe that she would need hospitalization.  Able to tolerate p.o.  States she has family that can help her at home.  Will discharge with meclizine, Zofran and ENT follow-up if symptoms or not improving with medical management at home.  Patient comfortable with this plan.  Recommended that she not drive until her vertiginous symptoms have completely resolved.   At this time, I do not feel there is any life-threatening condition present. I have reviewed, interpreted and discussed all results (EKG, imaging, lab, urine as appropriate) and exam findings with patient/family. I have reviewed nursing notes and appropriate previous records.  I feel the patient is safe to be discharged home without further emergent workup and can continue workup as an outpatient as needed. Discussed usual and customary return precautions. Patient/family verbalize understanding and are comfortable with this plan.  Outpatient follow-up has been provided as needed. All questions have been answered.    Jimmy Stipes, Delice Bison, DO 01/06/20 [phone]

## 2020-01-06 NOTE — ED Notes (Signed)
Patient arrived with [facility] from [facility] , she is here for MRI , patient reports mild dizziness at arrival , alert and oriented , respirations unlabored / denies pain .

## 2020-01-10 DIAGNOSIS — K219 Gastro-esophageal reflux disease without esophagitis: Secondary | ICD-10-CM | POA: Diagnosis not present

## 2020-01-10 DIAGNOSIS — Z0000 Encounter for general adult medical examination without abnormal findings: Secondary | ICD-10-CM | POA: Diagnosis not present

## 2020-01-10 DIAGNOSIS — Z1389 Encounter for screening for other disorder: Secondary | ICD-10-CM | POA: Diagnosis not present

## 2020-01-10 DIAGNOSIS — J449 Chronic obstructive pulmonary disease, unspecified: Secondary | ICD-10-CM | POA: Diagnosis not present

## 2020-01-10 DIAGNOSIS — R42 Dizziness and giddiness: Secondary | ICD-10-CM | POA: Diagnosis not present

## 2020-01-10 DIAGNOSIS — I129 Hypertensive chronic kidney disease with stage 1 through stage 4 chronic kidney disease, or unspecified chronic kidney disease: Secondary | ICD-10-CM | POA: Diagnosis not present

## 2020-01-10 DIAGNOSIS — L930 Discoid lupus erythematosus: Secondary | ICD-10-CM | POA: Diagnosis not present

## 2020-01-10 DIAGNOSIS — N183 Chronic kidney disease, stage 3 unspecified: Secondary | ICD-10-CM | POA: Diagnosis not present

## 2020-01-11 ENCOUNTER — Other Ambulatory Visit: Payer: Self-pay

## 2020-01-11 ENCOUNTER — Ambulatory Visit
Admission: RE | Admit: 2020-01-11 | Discharge: 2020-01-11 | Disposition: A | Discharge status: Home / Self Care | Payer: Medicare HMO | Source: Ambulatory Visit | Attending: Family Medicine | Admitting: Family Medicine

## 2020-01-11 DIAGNOSIS — Z1231 Encounter for screening mammogram for malignant neoplasm of breast: Secondary | ICD-10-CM

## 2020-01-12 ENCOUNTER — Other Ambulatory Visit: Payer: Self-pay | Admitting: Family Medicine

## 2020-01-12 DIAGNOSIS — E2839 Other primary ovarian failure: Secondary | ICD-10-CM

## 2020-01-19 ENCOUNTER — Other Ambulatory Visit: Payer: Medicare HMO

## 2020-01-25 DIAGNOSIS — Z87891 Personal history of nicotine dependence: Secondary | ICD-10-CM | POA: Diagnosis not present

## 2020-01-25 DIAGNOSIS — H938X3 Other specified disorders of ear, bilateral: Secondary | ICD-10-CM | POA: Diagnosis not present

## 2020-01-25 DIAGNOSIS — R42 Dizziness and giddiness: Secondary | ICD-10-CM | POA: Diagnosis not present

## 2020-01-27 DIAGNOSIS — R42 Dizziness and giddiness: Secondary | ICD-10-CM | POA: Insufficient documentation

## 2020-02-02 ENCOUNTER — Other Ambulatory Visit: Payer: Medicare HMO

## 2020-02-03 ENCOUNTER — Other Ambulatory Visit: Payer: Medicare HMO

## 2020-02-10 ENCOUNTER — Other Ambulatory Visit: Payer: Self-pay

## 2020-02-10 ENCOUNTER — Ambulatory Visit
Admission: RE | Admit: 2020-02-10 | Discharge: 2020-02-10 | Disposition: A | Discharge status: Home / Self Care | Payer: Medicare HMO | Source: Ambulatory Visit | Attending: Family Medicine | Admitting: Family Medicine

## 2020-02-10 DIAGNOSIS — M8588 Other specified disorders of bone density and structure, other site: Secondary | ICD-10-CM | POA: Diagnosis not present

## 2020-02-10 DIAGNOSIS — E2839 Other primary ovarian failure: Secondary | ICD-10-CM

## 2020-02-10 DIAGNOSIS — Z780 Asymptomatic menopausal state: Secondary | ICD-10-CM | POA: Diagnosis not present

## 2020-02-20 DIAGNOSIS — R42 Dizziness and giddiness: Secondary | ICD-10-CM | POA: Diagnosis not present

## 2020-04-11 DIAGNOSIS — H269 Unspecified cataract: Secondary | ICD-10-CM | POA: Diagnosis not present

## 2020-04-11 DIAGNOSIS — M1611 Unilateral primary osteoarthritis, right hip: Secondary | ICD-10-CM | POA: Diagnosis not present

## 2020-04-11 DIAGNOSIS — I129 Hypertensive chronic kidney disease with stage 1 through stage 4 chronic kidney disease, or unspecified chronic kidney disease: Secondary | ICD-10-CM | POA: Diagnosis not present

## 2020-04-11 DIAGNOSIS — N1831 Chronic kidney disease, stage 3a: Secondary | ICD-10-CM | POA: Diagnosis not present

## 2020-04-11 DIAGNOSIS — J449 Chronic obstructive pulmonary disease, unspecified: Secondary | ICD-10-CM | POA: Diagnosis not present

## 2020-06-19 DIAGNOSIS — L930 Discoid lupus erythematosus: Secondary | ICD-10-CM | POA: Diagnosis not present

## 2020-06-27 DIAGNOSIS — L930 Discoid lupus erythematosus: Secondary | ICD-10-CM | POA: Diagnosis not present

## 2020-06-27 DIAGNOSIS — Z79899 Other long term (current) drug therapy: Secondary | ICD-10-CM | POA: Diagnosis not present

## 2020-07-09 DIAGNOSIS — J449 Chronic obstructive pulmonary disease, unspecified: Secondary | ICD-10-CM | POA: Diagnosis not present

## 2020-07-09 DIAGNOSIS — N1831 Chronic kidney disease, stage 3a: Secondary | ICD-10-CM | POA: Diagnosis not present

## 2020-07-09 DIAGNOSIS — I129 Hypertensive chronic kidney disease with stage 1 through stage 4 chronic kidney disease, or unspecified chronic kidney disease: Secondary | ICD-10-CM | POA: Diagnosis not present

## 2020-07-16 DIAGNOSIS — J449 Chronic obstructive pulmonary disease, unspecified: Secondary | ICD-10-CM | POA: Diagnosis not present

## 2020-07-16 DIAGNOSIS — I129 Hypertensive chronic kidney disease with stage 1 through stage 4 chronic kidney disease, or unspecified chronic kidney disease: Secondary | ICD-10-CM | POA: Diagnosis not present

## 2020-07-16 DIAGNOSIS — H269 Unspecified cataract: Secondary | ICD-10-CM | POA: Diagnosis not present

## 2020-07-16 DIAGNOSIS — M1611 Unilateral primary osteoarthritis, right hip: Secondary | ICD-10-CM | POA: Diagnosis not present

## 2020-07-16 DIAGNOSIS — K219 Gastro-esophageal reflux disease without esophagitis: Secondary | ICD-10-CM | POA: Diagnosis not present

## 2020-07-19 DIAGNOSIS — I129 Hypertensive chronic kidney disease with stage 1 through stage 4 chronic kidney disease, or unspecified chronic kidney disease: Secondary | ICD-10-CM | POA: Diagnosis not present

## 2020-07-24 DIAGNOSIS — M25572 Pain in left ankle and joints of left foot: Secondary | ICD-10-CM | POA: Diagnosis not present

## 2020-07-31 DIAGNOSIS — M25552 Pain in left hip: Secondary | ICD-10-CM | POA: Diagnosis not present

## 2020-07-31 DIAGNOSIS — M25562 Pain in left knee: Secondary | ICD-10-CM | POA: Diagnosis not present

## 2020-09-11 ENCOUNTER — Ambulatory Visit: Payer: Medicare HMO | Attending: Internal Medicine

## 2020-09-11 DIAGNOSIS — Z23 Encounter for immunization: Secondary | ICD-10-CM

## 2020-09-11 NOTE — Progress Notes (Signed)
° °  Covid-19 Vaccination Clinic  Name:  Barbara [name]    MRN: [id number] DOB: 1951/10/05  09/11/2020  Barbara [name] was observed post Covid-19 immunization for 15 minutes without incident. She was provided with Vaccine Information Sheet and instruction to access the V-Safe system.   Barbara [name] was instructed to call 911 with any severe reactions post vaccine:  Difficulty breathing   Swelling of face and throat   A fast heartbeat   A bad rash all over body   Dizziness and weakness

## 2020-09-26 DIAGNOSIS — Z23 Encounter for immunization: Secondary | ICD-10-CM | POA: Diagnosis not present

## 2020-10-31 DIAGNOSIS — J449 Chronic obstructive pulmonary disease, unspecified: Secondary | ICD-10-CM | POA: Diagnosis not present

## 2020-10-31 DIAGNOSIS — N1831 Chronic kidney disease, stage 3a: Secondary | ICD-10-CM | POA: Diagnosis not present

## 2020-10-31 DIAGNOSIS — K219 Gastro-esophageal reflux disease without esophagitis: Secondary | ICD-10-CM | POA: Diagnosis not present

## 2020-10-31 DIAGNOSIS — M1611 Unilateral primary osteoarthritis, right hip: Secondary | ICD-10-CM | POA: Diagnosis not present

## 2020-10-31 DIAGNOSIS — H269 Unspecified cataract: Secondary | ICD-10-CM | POA: Diagnosis not present

## 2020-10-31 DIAGNOSIS — I129 Hypertensive chronic kidney disease with stage 1 through stage 4 chronic kidney disease, or unspecified chronic kidney disease: Secondary | ICD-10-CM | POA: Diagnosis not present

## 2021-01-14 DIAGNOSIS — Z0000 Encounter for general adult medical examination without abnormal findings: Secondary | ICD-10-CM | POA: Diagnosis not present

## 2021-01-14 DIAGNOSIS — N183 Chronic kidney disease, stage 3 unspecified: Secondary | ICD-10-CM | POA: Diagnosis not present

## 2021-01-14 DIAGNOSIS — J449 Chronic obstructive pulmonary disease, unspecified: Secondary | ICD-10-CM | POA: Diagnosis not present

## 2021-01-14 DIAGNOSIS — M8588 Other specified disorders of bone density and structure, other site: Secondary | ICD-10-CM | POA: Diagnosis not present

## 2021-01-14 DIAGNOSIS — I129 Hypertensive chronic kidney disease with stage 1 through stage 4 chronic kidney disease, or unspecified chronic kidney disease: Secondary | ICD-10-CM | POA: Diagnosis not present

## 2021-01-14 DIAGNOSIS — L930 Discoid lupus erythematosus: Secondary | ICD-10-CM | POA: Diagnosis not present

## 2021-01-14 DIAGNOSIS — Z86010 Personal history of colonic polyps: Secondary | ICD-10-CM | POA: Diagnosis not present

## 2021-01-14 DIAGNOSIS — Z1389 Encounter for screening for other disorder: Secondary | ICD-10-CM | POA: Diagnosis not present

## 2021-01-14 DIAGNOSIS — K219 Gastro-esophageal reflux disease without esophagitis: Secondary | ICD-10-CM | POA: Diagnosis not present

## 2021-01-22 ENCOUNTER — Other Ambulatory Visit: Payer: Self-pay | Admitting: Family Medicine

## 2021-01-22 DIAGNOSIS — Z1231 Encounter for screening mammogram for malignant neoplasm of breast: Secondary | ICD-10-CM

## 2021-01-23 DIAGNOSIS — Z20822 Contact with and (suspected) exposure to covid-19: Secondary | ICD-10-CM | POA: Diagnosis not present

## 2021-01-30 DIAGNOSIS — H269 Unspecified cataract: Secondary | ICD-10-CM | POA: Diagnosis not present

## 2021-01-30 DIAGNOSIS — M1611 Unilateral primary osteoarthritis, right hip: Secondary | ICD-10-CM | POA: Diagnosis not present

## 2021-01-30 DIAGNOSIS — N1831 Chronic kidney disease, stage 3a: Secondary | ICD-10-CM | POA: Diagnosis not present

## 2021-01-30 DIAGNOSIS — K219 Gastro-esophageal reflux disease without esophagitis: Secondary | ICD-10-CM | POA: Diagnosis not present

## 2021-01-30 DIAGNOSIS — I129 Hypertensive chronic kidney disease with stage 1 through stage 4 chronic kidney disease, or unspecified chronic kidney disease: Secondary | ICD-10-CM | POA: Diagnosis not present

## 2021-01-30 DIAGNOSIS — J449 Chronic obstructive pulmonary disease, unspecified: Secondary | ICD-10-CM | POA: Diagnosis not present

## 2021-01-30 DIAGNOSIS — I10 Essential (primary) hypertension: Secondary | ICD-10-CM | POA: Diagnosis not present

## 2021-02-13 DIAGNOSIS — R202 Paresthesia of skin: Secondary | ICD-10-CM | POA: Diagnosis not present

## 2021-02-13 DIAGNOSIS — M7062 Trochanteric bursitis, left hip: Secondary | ICD-10-CM | POA: Diagnosis not present

## 2021-03-12 ENCOUNTER — Other Ambulatory Visit: Payer: Self-pay

## 2021-03-12 ENCOUNTER — Ambulatory Visit
Admission: RE | Admit: 2021-03-12 | Discharge: 2021-03-12 | Disposition: A | Discharge status: Home / Self Care | Payer: Medicare HMO | Source: Ambulatory Visit | Attending: Family Medicine | Admitting: Family Medicine

## 2021-03-12 DIAGNOSIS — Z1231 Encounter for screening mammogram for malignant neoplasm of breast: Secondary | ICD-10-CM | POA: Diagnosis not present

## 2021-03-13 ENCOUNTER — Encounter: Payer: Self-pay | Admitting: Internal Medicine

## 2021-03-13 ENCOUNTER — Ambulatory Visit: Payer: Medicare HMO | Admitting: Internal Medicine

## 2021-03-13 VITALS — BP 140/78 | HR 80 | Temp 97.0°F | Ht 65.0 in | Wt 190.0 lb

## 2021-03-13 DIAGNOSIS — J449 Chronic obstructive pulmonary disease, unspecified: Secondary | ICD-10-CM | POA: Diagnosis not present

## 2021-03-13 DIAGNOSIS — K219 Gastro-esophageal reflux disease without esophagitis: Secondary | ICD-10-CM

## 2021-03-13 DIAGNOSIS — J209 Acute bronchitis, unspecified: Secondary | ICD-10-CM

## 2021-03-13 MED ORDER — PANTOPRAZOLE SODIUM 40 MG PO TBEC: 40.0000 mg | DELAYED_RELEASE_TABLET | Freq: Every day | ORAL | 5 refills | Status: DC

## 2021-03-13 NOTE — Patient Instructions (Addendum)
The patient should have follow up scheduled with myself in 1 months.   Prior to next visit patient should have:  Spirometry/Feno  Start gargling with alcohol based mouthwash every time you take symbicort. (Equate brand is perfect.) Take the lozenges for a few days until the thrush resolves.   Keep taking spiriva for now.   Stop taking pepcid. I am changing you to protonix.    By learning about asthma and how it can be controlled, you take an important step toward managing this disease. Work closely with your asthma care team to learn all you can about your asthma, how to avoid triggers, what your medications do, and how to take them correctly. With proper care, you can live free of asthma symptoms and maintain a normal, healthy lifestyle.   What is asthma? Asthma is a chronic disease that affects the airways of the lungs. During normal breathing, the bands of muscle that surround the airways are relaxed and air moves freely. During an asthma episode or "attack," there are three main changes that stop air from moving easily through the airways:  The bands of muscle that surround the airways tighten and make the airways narrow. This tightening is called bronchospasm.   The lining of the airways becomes swollen or inflamed.   The cells that line the airways produce more mucus, which is thicker than normal and clogs the airways.  These three factors - bronchospasm, inflammation, and mucus production - cause symptoms such as difficulty breathing, wheezing, and coughing.  What are the most common symptoms of asthma? Asthma symptoms are not the same for everyone. They can even change from episode to episode in the same person. Also, you may have only one symptom of asthma, such as cough, but another person may have all the symptoms of asthma. It is important to know all the symptoms of asthma and to be aware that your asthma can present in any of these ways at any time. The most common symptoms  include: . Coughing, especially at night  . Shortness of breath  . Wheezing  . Chest tightness, pain, or pressure   Who is affected by asthma? Asthma affects 22 million Americans; about 6 million of these are children under age 60. People who have a family history of asthma have an increased risk of developing the disease. Asthma is also more common in people who have allergies or who are exposed to tobacco smoke. However, anyone can develop asthma at any time. Some people may have asthma all of their lives, while others may develop it as adults.  What causes asthma? The airways in a person with asthma are very sensitive and react to many things, or "triggers." Contact with these triggers causes asthma symptoms. One of the most important parts of asthma control is to identify your triggers and then avoid them when possible. The only trigger you do not want to avoid is exercise. Pre-treatment with medicines before exercise can allow you to stay active yet avoid asthma symptoms. Common asthma triggers include: 1. Infections (colds, viruses, flu, sinus infections)  2. Exercise  3. Weather (changes in temperature and/or humidity, cold air)  4. Tobacco smoke  5. Allergens (dust mites, pollens, pets, mold spores, cockroaches, and sometimes foods)  6. Irritants (strong odors from cleaning products, perfume, wood smoke, air pollution)  7. Strong emotions such as crying or laughing hard  8. Some medications   How is asthma diagnosed? To diagnose asthma, your doctor will first review your  medical history, family history, and symptoms. Your doctor will want to know any past history of breathing problems you may have had, as well as a family history of asthma, allergies, eczema (a bumpy, itchy skin rash caused by allergies), or other lung disease. It is important that you describe your symptoms in detail (cough, wheeze, shortness of breath, chest tightness), including when and how often they occur. The  doctor will perform a physical examination and listen to your heart and lungs. He or she may also order breathing tests, allergy tests, blood tests, and chest and sinus X-rays. The tests will find out if you do have asthma and if there are any other conditions that are contributing factors.  How is asthma treated? Asthma can be controlled, but not cured. It is not normal to have frequent symptoms, trouble sleeping, or trouble completing tasks. Appropriate asthma care will prevent symptoms and visits to the emergency room and hospital. Asthma medicines are one of the mainstays of asthma treatment. The drugs used to treat asthma are explained below.  Anti-inflammatories: These are the most important drugs for most people with asthma. Anti-inflammatory drugs reduce swelling and mucus production in the airways. As a result, airways are less sensitive and less likely to react to triggers. These medications need to be taken daily and may need to be taken for several weeks before they begin to control asthma. Anti-inflammatory medicines lead to fewer symptoms, better airflow, less sensitive airways, less airway damage, and fewer asthma attacks. If taken every day, they CONTROL or prevent asthma symptoms.   Bronchodilators: These drugs relax the muscle bands that tighten around the airways. This action opens the airways, letting more air in and out of the lungs and improving breathing. Bronchodilators also help clear mucus from the lungs. As the airways open, the mucus moves more freely and can be coughed out more easily. In short-acting forms, bronchodilators RELIEVE or stop asthma symptoms by quickly opening the airways and are very helpful during an asthma episode. In long-acting forms, bronchodilators provide CONTROL of asthma symptoms and prevent asthma episodes.  Asthma drugs can be taken in a variety of ways. Inhaling the medications by using a metered dose inhaler, dry powder inhaler, or nebulizer is one way  of taking asthma medicines. Oral medicines (pills or liquids you swallow) may also be prescribed.  Asthma severity Asthma is classified as either "intermittent" (comes and goes) or "persistent" (lasting). Persistent asthma is further described as being mild, moderate, or severe. The severity of asthma is based on how often you have symptoms both during the day and night, as well as by the results of lung function tests and by how well you can perform activities. The "severity" of asthma refers to how "intense" or "strong" your asthma is.  Asthma control Asthma control is the goal of asthma treatment. Regardless of your asthma severity, it may or may not be controlled. Asthma control means: . You are able to do everything you want to do at work and home  . You have no (or minimal) asthma symptoms  . You do not wake up from your sleep or earlier than usual in the morning due to asthma  . You rarely need to use your reliever medicine (inhaler)  Another major part of your treatment is that you are happy with your asthma care and believe your asthma is controlled.  Monitoring symptoms A key part of treatment is keeping track of how well your lungs are working. Monitoring your symptoms  what they are, how and when they happen, and how severe they are  is an important part of being able to control your asthma.  Sometimes asthma is monitored using a peak flow meter. A peak flow (PF) meter measures how fast the air comes out of your lungs. It can help you know when your asthma is getting worse, sometimes even before you have symptoms. By taking daily peak flow readings, you can learn when to adjust medications to keep asthma under good control. It is also used to create your asthma action plan (see below). Your doctor can use your peak flow readings to adjust your treatment plan in some cases.  Asthma Action Plan Based on your history and asthma severity, you and your doctor will develop a care plan  called an "asthma action plan." The asthma action plan describes when and how to use your medicines, actions to take when asthma worsens, and when to seek emergency care. Make sure you understand this plan. If you do not, ask your asthma care provider any questions you may have. Your asthma action plan is one of the keys to controlling asthma. Keep it readily available to remind you of what you need to do every day to control asthma and what you need to do when symptoms occur.  Goals of asthma therapy These are the goals of asthma treatment: . Live an active, normal life  . Prevent chronic and troublesome symptoms  . Attend work or school every day  . Perform daily activities without difficulty  . Stop urgent visits to the doctor, emergency department, or hospital  . Use and adjust medications to control asthma with few or no side effects

## 2021-03-13 NOTE — Progress Notes (Signed)
KETARA CAVNESS    [id number]    21-Jan-1951  Primary Care Physician:Sun, Gari Crown, MD  Referring Physician: Donald Prose, MD [address],  [place] [address] Reason for Consultation: shortness of breath Date of Consultation: 03/13/2021  Chief complaint:   Chief Complaint  Patient presents with  . Shortness of Breath    Reports dyspnea x3 months that hasn't improved. States she uses albuterol inhaler several times a day. Also reports she uses cleaning products to take care of a relative and has noticed a little improvement when not using the products     HPI: TANICIA WOLAVER is a 70 y.o. woman with former tobacco use who presents for cough and shortness of breath.  Diagnosed with COPD 10 years ago. Has been on symbicort for cough and shortness of breath. She feels it isn't helping as much as it used to. Added spiriva a few months ago and initially she thought it might be helping, but now she is back to using ventolin frequently again. Symptoms are cough, chest tightness, wheezing. Currently taking ventolin 5-6 times/day with relief. Has not taken any prednisone for her breathing. No hospitalizations for pneumonia or bronchitis. She denies childhood respiratory disease. However she developed asthma when she was 17.  Sister also has it.    She also has a chronic cough with reflux and has been on pepcid since 2013.  Having breakthrough reflux and waking up at night coughing.  Current Regimen: spiriva, symbicort, albuterol, zyrtec Asthma Triggers: cleaning solutions, seasonal allergies, heat Exacerbations in the last year: none requiring prednisone History of hospitalization or intubation: none Allergy Testing: as a teenager, doesn't recall results but was "allergic to everything" GERD: yes on pepcid, not well controlled Allergic Rhinitis: yes ACT: No flowsheet data found. FeNO: doesn't have.   Social history:  Occupation: Exposures: Smoking  history:  Social History   Occupational History  . Not on file  Tobacco Use  . Smoking status: Former Smoker    Packs/day: 1.00    Years: 45.00    Pack years: 45.00    Types: Cigarettes    Quit date: 10/27/2011    Years since quitting: 9.3  . Smokeless tobacco: Never Used  Substance and Sexual Activity  . Alcohol use: No  . Drug use: No  . Sexual activity: Never    Relevant family history: Family History  Problem Relation Age of Onset  . Dementia Mother   . Kidney disease Mother   . Asthma Sister   . Mental illness Brother   . Breast cancer Neg Hx     Past Medical History:  Diagnosis Date  . Arthritis    right knee, neck  . Asthma   . COPD (chronic obstructive pulmonary disease) ([facility])   . Hypertension   . Mouth trouble    infection from tooth that was pulled approx. mid Mar. 2013    Past Surgical History:  Procedure Laterality Date  . ABDOMINAL HYSTERECTOMY    . SHOULDER SURGERY  in age 79's   cyst removed from [place] socket of left shoulder  . TONSILLECTOMY  age 51  . TUBAL LIGATION      Physical Exam: Blood pressure 140/78, pulse 80, temperature (!) 97 F (36.1 C), height 5\' 5"  (1.651 m), weight 190 lb (86.2 kg), SpO2 97 %. Gen:      No acute distress, breathy voice, hoarse voice ENT:   Mmm, no polyps, thrush Lungs:  No increased respiratory effort, symmetric chest wall excursion, clear to auscultation bilaterally, no wheezes or crackles CV:         Regular rate and rhythm; soft systolic murmur. Abd:      + bowel sounds; soft, non-tender; no distension MSK: no acute synovitis of DIP or PIP joints, no mechanics hands.  Skin:      Warm and dry; no rashes Neuro: normal speech, no focal facial asymmetry Psych: alert and oriented x3, normal mood and affect   Data Reviewed/Medical Decision Making:  Independent interpretation of tests: Imaging: . Review of patient's chest xray 2018  images revealed no acute cardiopulmonary process. The patient's images  have been independently reviewed by me.    PFTs: None on file  Labs:  Lab Results  Component Value Date   WBC 8.6 01/05/2020   HGB 13.3 01/05/2020   HCT 41.6 01/05/2020   MCV 93.5 01/05/2020   PLT 145 (L) 01/05/2020   Lab Results  Component Value Date   NA 138 01/05/2020   K 3.4 (L) 01/05/2020   CL 100 01/05/2020   CO2 28 01/05/2020     Immunization status:  Immunization History  Administered Date(s) Administered  . Influenza Split 10/22/2011, 09/24/2012, 09/22/2013, 09/07/2014, 09/08/2015  . PFIZER(Purple Top)SARS-COV-2 Vaccination 09/11/2020  . Pneumococcal Polysaccharide-23 10/22/2011    . I reviewed prior external note(s) from Dr. Nancy Fetter, primary care . I reviewed the result(s) of the labs and imaging as noted above.  . I have ordered PFT    Assessment:  Asthma COPD Overlap Syndrome, poor control History of tobacco use disorder GERD, not well controlled Allergic Rhinitis, not well controlled  Plan/Recommendations: Suspect worsening asthma/copd control.  Will obtain repeat spirometry with exhaled NO to evaluate further.  Continue albuterol prn symbicort and spiriva for now. Will change pepcid to PPI for optimal reflux control. Start zyrtec for rhinitis right now. Will see her back after PFTs. Consider escalation of therpy at that time.   We discussed disease management and progression at length today for asthma.  Return to Care: Return in about 4 weeks (around 04/10/2021).  Lenice Llamas, MD [facility] [place]  CC: Donald Prose, MD

## 2021-04-02 IMAGING — MR MR HEAD W/O CM
12 of 13 series · 43 of 48 positions shown · non-contrast
Comparison: Prior head CT from 01/05/2020.

CLINICAL DATA: Initial evaluation for acute ataxia, stroke
suspected.

EXAM:
MRI HEAD WITHOUT CONTRAST
TECHNIQUE: Multiplanar, multiecho pulse sequences of the brain and surrounding
structures were obtained without intravenous contrast.

[Series 5: DWI · axial · 3.0mm · 0.88mm/px · z∈[-67,+68]mm · 6 of 92 slices shown (1 of 4)]
[im 1/92]
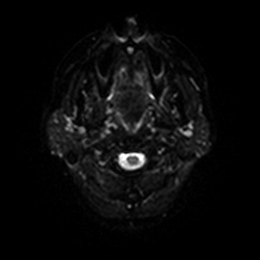
[im 19/92]
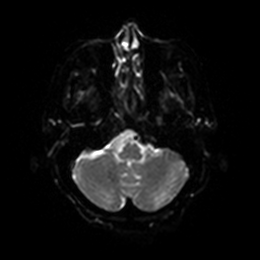
[im 37/92]
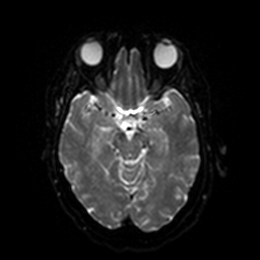
[im 55/92]
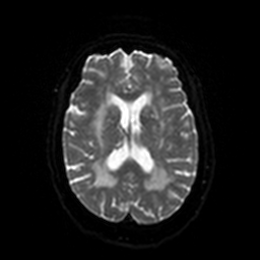
[im 73/92]
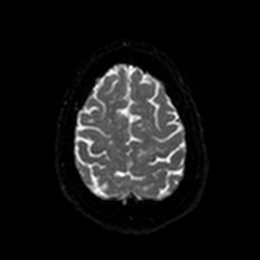
[im 92/92]
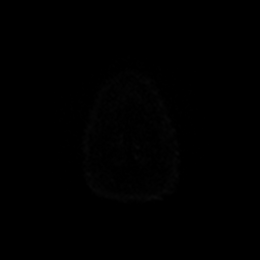

[Series 6: DWI · axial · 3.0mm · 0.88mm/px · z∈[-67,+68]mm · 2 of 46 slices shown (2 of 4)]
[im 1/46]
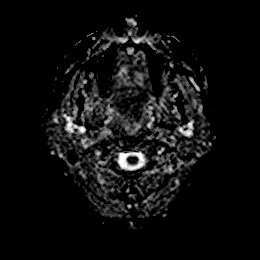
[im 46/46]
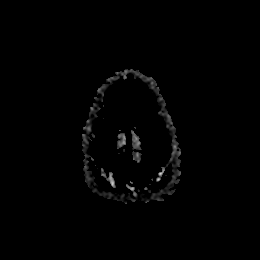

[Series 7: DWI · coronal · 4.0mm · 0.88mm/px · 5 of 70 slices shown (3 of 4)]
[im 1/70]
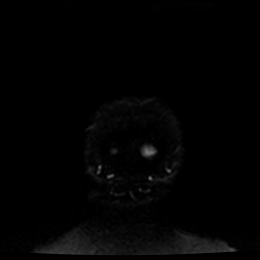
[im 18/70]
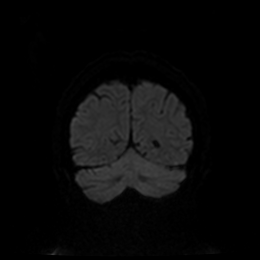
[im 35/70]
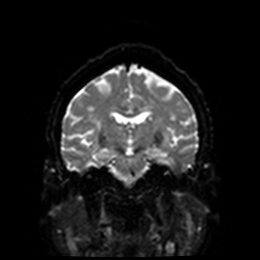
[im 52/70]
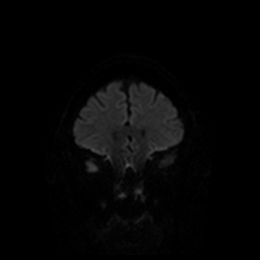
[im 70/70]
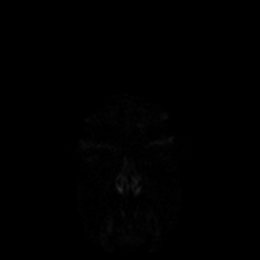

[Series 8: DWI · coronal · 4.0mm · 0.88mm/px · 3 of 35 slices shown (4 of 4)]
[im 1/35]
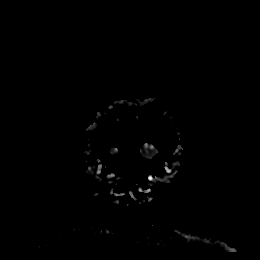
[im 18/35]
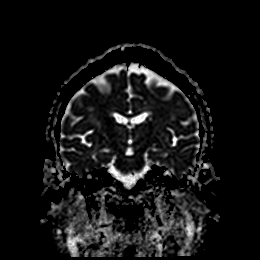
[im 35/35]
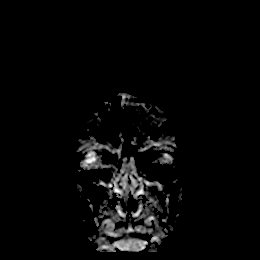

[Series 9: T1 · sagittal · 5.0mm · 0.75mm/px · 2 of 23 slices shown]
[im 1/23]
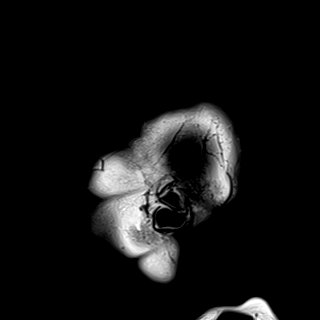
[im 23/23]
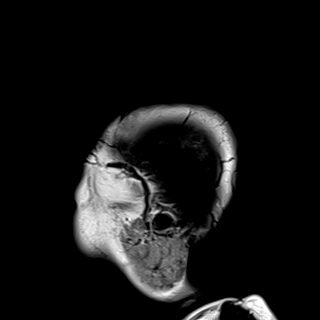

[Series 10: T2 · axial · 5.0mm · 0.72mm/px · z∈[-72,+71]mm · 2 of 25 slices shown (1 of 2)]
[im 1/25]
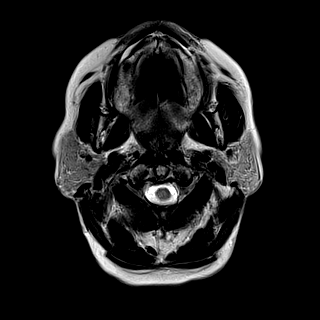
[im 25/25]
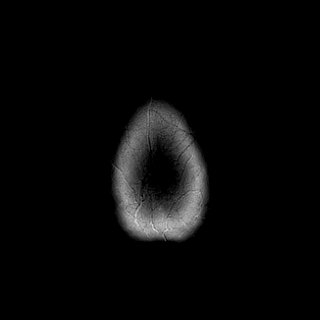

[Series 11: FLAIR · axial · 5.0mm · 0.45mm/px · z∈[-73,+71]mm · 2 of 25 slices shown]
[im 1/25]
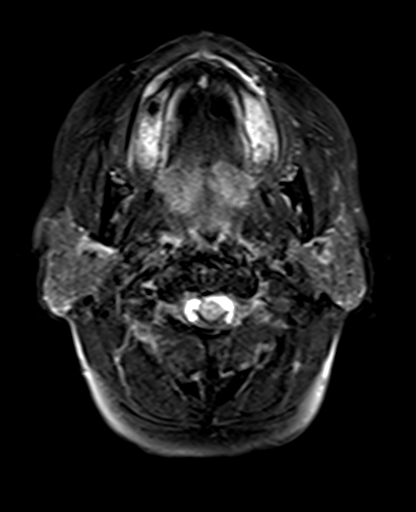
[im 25/25]
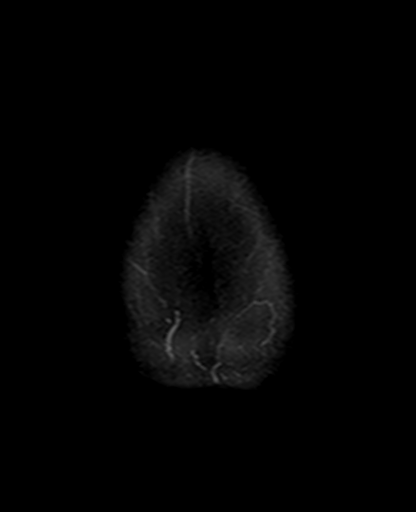

[Series 12: mag_images · axial · 3.0mm · 0.90mm/px · z∈[-83,+94]mm · 5 of 60 slices shown]
[im 1/60]
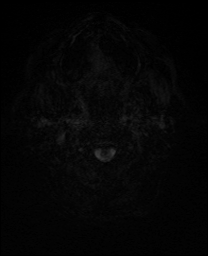
[im 15/60]
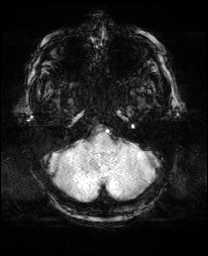
[im 30/60]
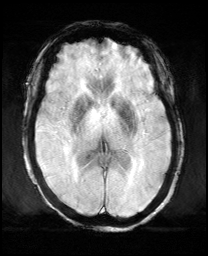
[im 45/60]
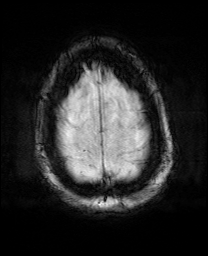
[im 60/60]
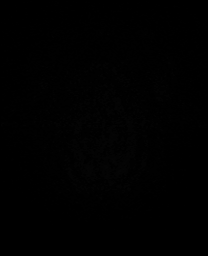

[Series 13: pha_images · axial · 3.0mm · 0.90mm/px · z∈[-83,+94]mm · 5 of 60 slices shown]
[im 1/60]
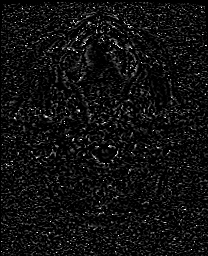
[im 15/60]
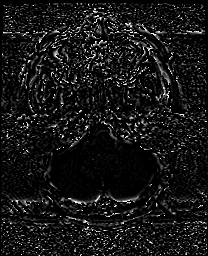
[im 30/60]
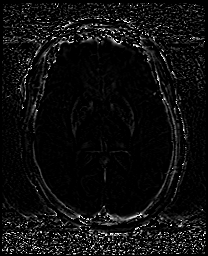
[im 45/60]
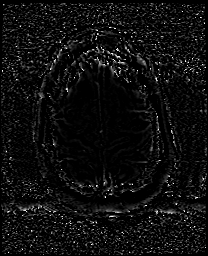
[im 60/60]
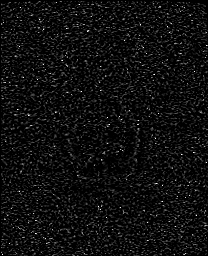

[Series 14: swi_images · axial · 3.0mm · 0.90mm/px · z∈[-83,+94]mm · 5 of 60 slices shown]
[im 1/60]
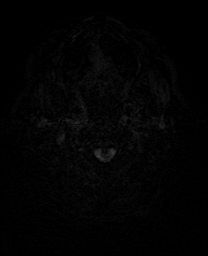
[im 15/60]
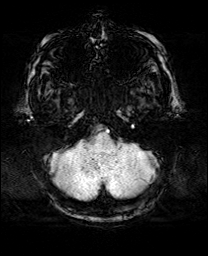
[im 30/60]
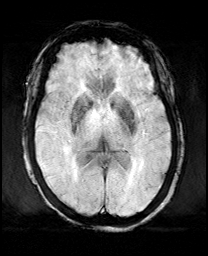
[im 45/60]
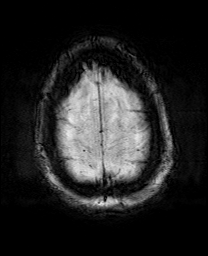
[im 60/60]
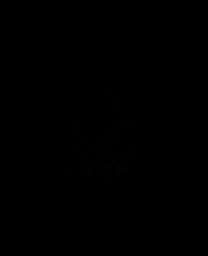

[Series 15: mip_images(sw) · axial · 24.0mm · 0.90mm/px · z∈[-72,+83]mm · 4 of 53 slices shown]
[im 1/53]
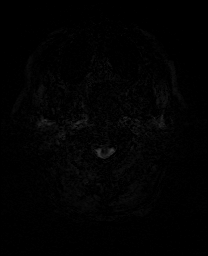
[im 18/53]
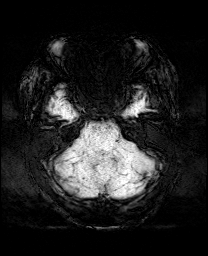
[im 35/53]
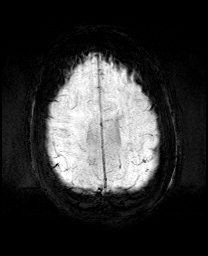
[im 53/53]
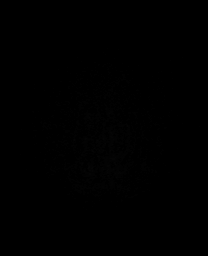

[Series 17: T2 · coronal · 5.0mm · 0.72mm/px · 2 of 28 slices shown (2 of 2)]
[im 1/28]
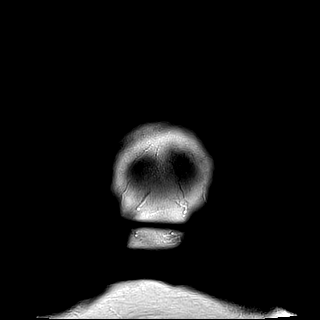
[im 28/28]
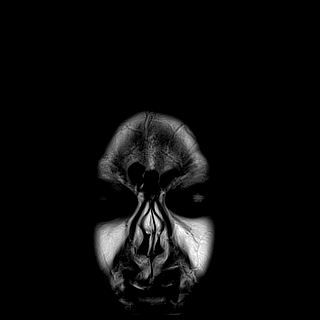

[43 of 48 positions shown; findings below may reference images not displayed]

FINDINGS: Brain: Generalized age-related cerebral atrophy. Patchy and
confluent T2/FLAIR hyperintensity seen within the periventricular
deep white matter both cerebral hemispheres as well as the pons,
most consistent with chronic microvascular ischemic disease,
moderate in nature.

No abnormal foci of restricted diffusion to suggest acute or
subacute ischemia. Gray-white matter differentiation maintained. No
encephalomalacia to suggest chronic cortical infarction. No foci of
susceptibility artifact to suggest acute or chronic intracranial
hemorrhage.

No mass lesion, midline shift or mass effect. No hydrocephalus. No
extra-axial fluid collection. Pituitary gland and suprasellar region
normal. Midline structures intact.

Vascular: Major intracranial vascular flow voids are maintained.

Skull and upper cervical spine: Craniocervical junction normal.
Upper cervical spine within normal limits. Bone marrow signal
intensity normal. No scalp soft tissue abnormality.

Sinuses/Orbits: Globes and orbital soft tissues within normal
limits. Paranasal sinuses and mastoid air cells are clear. Inner ear
structures grossly normal.

Other: None.
IMPRESSION: 1. No acute intracranial abnormality.
2. Generalized age-related cerebral atrophy with moderate chronic
microvascular ischemic disease.

## 2021-04-03 DIAGNOSIS — M1611 Unilateral primary osteoarthritis, right hip: Secondary | ICD-10-CM | POA: Diagnosis not present

## 2021-04-03 DIAGNOSIS — H269 Unspecified cataract: Secondary | ICD-10-CM | POA: Diagnosis not present

## 2021-04-03 DIAGNOSIS — N1831 Chronic kidney disease, stage 3a: Secondary | ICD-10-CM | POA: Diagnosis not present

## 2021-04-03 DIAGNOSIS — K219 Gastro-esophageal reflux disease without esophagitis: Secondary | ICD-10-CM | POA: Diagnosis not present

## 2021-04-03 DIAGNOSIS — I129 Hypertensive chronic kidney disease with stage 1 through stage 4 chronic kidney disease, or unspecified chronic kidney disease: Secondary | ICD-10-CM | POA: Diagnosis not present

## 2021-04-03 DIAGNOSIS — J449 Chronic obstructive pulmonary disease, unspecified: Secondary | ICD-10-CM | POA: Diagnosis not present

## 2021-04-15 ENCOUNTER — Other Ambulatory Visit (HOSPITAL_COMMUNITY): Payer: Medicare HMO

## 2021-04-15 ENCOUNTER — Other Ambulatory Visit (HOSPITAL_COMMUNITY)
Admission: RE | Admit: 2021-04-15 | Discharge: 2021-04-15 | Disposition: A | Discharge status: Home / Self Care | Payer: Medicare HMO | Source: Ambulatory Visit | Attending: Internal Medicine | Admitting: Internal Medicine

## 2021-04-15 DIAGNOSIS — Z20822 Contact with and (suspected) exposure to covid-19: Secondary | ICD-10-CM | POA: Diagnosis not present

## 2021-04-15 DIAGNOSIS — Z01812 Encounter for preprocedural laboratory examination: Secondary | ICD-10-CM | POA: Diagnosis not present

## 2021-04-16 LAB — SARS CORONAVIRUS 2 (TAT 6-24 HRS): SARS Coronavirus 2: NEGATIVE

## 2021-04-17 ENCOUNTER — Other Ambulatory Visit: Payer: Self-pay

## 2021-04-17 ENCOUNTER — Ambulatory Visit: Payer: Medicare HMO | Admitting: Internal Medicine

## 2021-04-17 ENCOUNTER — Encounter: Payer: Self-pay | Admitting: Internal Medicine

## 2021-04-17 ENCOUNTER — Ambulatory Visit (INDEPENDENT_AMBULATORY_CARE_PROVIDER_SITE_OTHER): Payer: Medicare HMO | Admitting: Internal Medicine

## 2021-04-17 VITALS — BP 138/76 | HR 72 | Ht 65.0 in | Wt 192.0 lb

## 2021-04-17 DIAGNOSIS — M1610 Unilateral primary osteoarthritis, unspecified hip: Secondary | ICD-10-CM | POA: Insufficient documentation

## 2021-04-17 DIAGNOSIS — J4550 Severe persistent asthma, uncomplicated: Secondary | ICD-10-CM

## 2021-04-17 DIAGNOSIS — J209 Acute bronchitis, unspecified: Secondary | ICD-10-CM

## 2021-04-17 DIAGNOSIS — J4489 Other specified chronic obstructive pulmonary disease: Secondary | ICD-10-CM

## 2021-04-17 DIAGNOSIS — L930 Discoid lupus erythematosus: Secondary | ICD-10-CM | POA: Insufficient documentation

## 2021-04-17 DIAGNOSIS — B1920 Unspecified viral hepatitis C without hepatic coma: Secondary | ICD-10-CM | POA: Insufficient documentation

## 2021-04-17 DIAGNOSIS — F418 Other specified anxiety disorders: Secondary | ICD-10-CM | POA: Insufficient documentation

## 2021-04-17 DIAGNOSIS — I129 Hypertensive chronic kidney disease with stage 1 through stage 4 chronic kidney disease, or unspecified chronic kidney disease: Secondary | ICD-10-CM | POA: Insufficient documentation

## 2021-04-17 DIAGNOSIS — Z860100 Personal history of colon polyps, unspecified: Secondary | ICD-10-CM | POA: Insufficient documentation

## 2021-04-17 DIAGNOSIS — M8588 Other specified disorders of bone density and structure, other site: Secondary | ICD-10-CM | POA: Insufficient documentation

## 2021-04-17 DIAGNOSIS — E6609 Other obesity due to excess calories: Secondary | ICD-10-CM | POA: Insufficient documentation

## 2021-04-17 DIAGNOSIS — H269 Unspecified cataract: Secondary | ICD-10-CM | POA: Insufficient documentation

## 2021-04-17 DIAGNOSIS — J449 Chronic obstructive pulmonary disease, unspecified: Secondary | ICD-10-CM | POA: Diagnosis not present

## 2021-04-17 DIAGNOSIS — N1831 Chronic kidney disease, stage 3a: Secondary | ICD-10-CM | POA: Insufficient documentation

## 2021-04-17 DIAGNOSIS — K219 Gastro-esophageal reflux disease without esophagitis: Secondary | ICD-10-CM | POA: Insufficient documentation

## 2021-04-17 DIAGNOSIS — J440 Chronic obstructive pulmonary disease with acute lower respiratory infection: Secondary | ICD-10-CM | POA: Diagnosis not present

## 2021-04-17 LAB — PULMONARY FUNCTION TEST
FEF 25-75 Post: 0.77 L/sec
FEF 25-75 Pre: 0.5 L/sec
FEF2575-%Change-Post: 54 %
FEF2575-%Pred-Post: 47 %
FEF2575-%Pred-Pre: 30 %
FEV1-%Change-Post: 14 %
FEV1-%Pred-Post: 63 %
FEV1-%Pred-Pre: 55 %
FEV1-Post: 1.13 L
FEV1-Pre: 0.99 L
FEV1FVC-%Change-Post: 4 %
FEV1FVC-%Pred-Pre: 81 %
FEV6-%Change-Post: 8 %
FEV6-%Pred-Post: 77 %
FEV6-%Pred-Pre: 71 %
FEV6-Post: 1.71 L
FEV6-Pre: 1.58 L
FEV6FVC-%Change-Post: 0 %
FEV6FVC-%Pred-Post: 104 %
FEV6FVC-%Pred-Pre: 104 %
FVC-%Change-Post: 9 %
FVC-%Pred-Post: 74 %
FVC-%Pred-Pre: 68 %
FVC-Post: 1.72 L
FVC-Pre: 1.58 L
Post FEV1/FVC ratio: 66 %
Post FEV6/FVC ratio: 100 %
Pre FEV1/FVC ratio: 63 %
Pre FEV6/FVC Ratio: 100 %

## 2021-04-17 LAB — POCT EXHALED NITRIC OXIDE: FeNO level (ppb): 137

## 2021-04-17 MED ORDER — FLUTICASONE-SALMETEROL 230-21 MCG/ACT IN AERO: 2.0000 | INHALATION_SPRAY | Freq: Two times a day (BID) | RESPIRATORY_TRACT | 3 refills | Status: DC

## 2021-04-17 MED ORDER — MONTELUKAST SODIUM 10 MG PO TABS
10.0000 mg | ORAL_TABLET | Freq: Every day | ORAL | 11 refills | Status: DC
Start: 2021-04-17 — End: 2021-05-06

## 2021-04-17 NOTE — Progress Notes (Signed)
Barbara [name]    [id number]    [birth year]  Primary Care Physician:Sun, Gari Crown, MD Date of Appointment: 04/17/2021 Established Patient Visit  Chief complaint:   Chief Complaint  Patient presents with  . Asthma    PFT and Feno     HPI: Barbara [name] is a 70 y.o. woman with Asthma COPD overlap syndrome.  Interval Updates: Here for follow up. Reflux worse with spicy food, doing better since changing medications last time to PPI.  Rhinitis better with claritin  Current Regimen: symbicort 2 puffs BID, spiriva, prn albuterol Asthma Triggers: allergies, being outside, exertion, cleaning solutions, heat Exacerbations in the last year: non requiring prednisone History of hospitalization or intubation:none Allergy Testing: never had.  GERD: yes on PPI Allergic Rhinitis: on cetirizine ACT:  Asthma Control Test ACT Total Score  04/17/2021 12   FeNO: 137 ppb   I have reviewed the patient's family social and past medical history and updated as appropriate.   Past Medical History:  Diagnosis Date  . Arthritis    right knee, neck  . Asthma   . COPD (chronic obstructive pulmonary disease) ([facility])   . Hypertension   . Mouth trouble    infection from tooth that was pulled approx. mid Mar. 2013    Past Surgical History:  Procedure Laterality Date  . ABDOMINAL HYSTERECTOMY    . SHOULDER SURGERY  in age 86's   cyst removed from [place] socket of left shoulder  . TONSILLECTOMY  age 19  . TUBAL LIGATION      Family History  Problem Relation Age of Onset  . Dementia Mother   . Kidney disease Mother   . Asthma Sister   . Mental illness Brother   . Breast cancer Neg Hx     Social History   Occupational History  . Not on file  Tobacco Use  . Smoking status: Former Smoker    Packs/day: 1.00    Years: 45.00    Pack years: 45.00    Types: Cigarettes    Quit date: 10/27/2011    Years since quitting: 9.4  . Smokeless tobacco: Never Used  Substance and Sexual  Activity  . Alcohol use: No  . Drug use: No  . Sexual activity: Never     Physical Exam: Blood pressure 138/76, pulse 72, height 5\' 5"  (1.651 m), weight 192 lb (87.1 kg), SpO2 94 %.  Gen:      No acute distress ENT:  no nasal polyps, mucus membranes moist Lungs:    Diminished bilaterally, no wheezes or crackles, occasional expiratory wheeze over the neck CV:         Regular rate and rhythm; no murmurs, rubs, or gallops.  No pedal edema   Data Reviewed: Imaging: I have personally reviewed the chest xray April 2018 - mild cardiomegaly  PFTs:  PFT Results Latest Ref Rng & Units 04/17/2021  FVC-Pre L 1.58  FVC-Predicted Pre % 68  FVC-Post L 1.72  FVC-Predicted Post % 74  Pre FEV1/FVC % % 63  Post FEV1/FCV % % 66  FEV1-Pre L 0.99  FEV1-Predicted Pre % 55  FEV1-Post L 1.13   I have personally reviewed the patient's PFTs and moderately severe airflow limitation  Labs:  Immunization status: Immunization History  Administered Date(s) Administered  . Hepatitis A, Adult 03/12/2016, 09/08/2016  . Influenza Split 10/22/2011, 09/24/2012, 09/22/2013, 10/06/2013, 09/07/2014, 09/08/2015, 09/04/2017, 09/03/2018  . PFIZER(Purple Top)SARS-COV-2 Vaccination 01/02/2020, 01/23/2020, 09/11/2020  . Pneumococcal  Conjugate-13 07/02/2015  . Pneumococcal Polysaccharide-23 10/22/2011, 01/04/2019  . Tdap 01/02/2016  . Zoster 01/02/2016, 10/21/2017, 12/22/2017    Assessment:  Asthma COPD Overlap Syndrome History of tobacco use disorder GERD Allergic Rhinitis - not well controlled   Plan/Recommendations: She has uncontrolled asthma with allergic phenotype. Feno was 137 ppb!  Get blood work today - IgE, Eos, region 2 allergy panel Switch symbicort to advair for higher dose ICS. Continue spiriva Continue albuterol Continue PPI BID Continue claritin, add singulair for rhinitis.  May need biologic agents pending blood work.   Return to Care: Return in about 2 months (around  06/17/2021).   Lenice Llamas, MD [facility] [place]

## 2021-04-17 NOTE — Progress Notes (Signed)
Spirometry pre and post done today. 

## 2021-04-17 NOTE — Patient Instructions (Addendum)
The patient should have follow up scheduled with myself in 2 months.   Stop symbicort. Switch to advair 2 puffs twice a day. Gargle after use  Get some blood work today.   Start taking singulair once a day for allergies with claritin.

## 2021-04-17 NOTE — Progress Notes (Signed)
The patient has been prescribed the inhaler advair HFA. Inhaler technique was demonstrated to patient. The patient subsequently demonstrated correct technique.

## 2021-04-18 LAB — RESPIRATORY ALLERGY PROFILE REGION II ~~LOC~~
Allergen, A. alternata, m6: <0.1 kU/L
Allergen, Cedar tree, t12: <0.1 kU/L
Allergen, Comm Silver Birch, t9: <0.1 kU/L
Allergen, Cottonwood, t14: <0.1 kU/L
Allergen, D pternoyssinus,d7: 3.33 kU/L — ABNORMAL HIGH
Allergen, Mouse Urine Protein, e78: 4.77 kU/L — ABNORMAL HIGH
Allergen, Mulberry, t76: <0.1 kU/L
Allergen, Oak,t7: <0.1 kU/L
Allergen, P. notatum, m1: <0.1 kU/L
Aspergillus fumigatus, m3: <0.1 kU/L
Bermuda Grass: <0.1 kU/L
Box Elder IgE: <0.1 kU/L
CLADOSPORIUM HERBARUM (M2) IGE: <0.1 kU/L
COMMON RAGWEED (SHORT) (W1) IGE: <0.1 kU/L
Cat Dander: >100 kU/L — ABNORMAL HIGH
Class: 0
Class: 0
Class: 0
Class: 0
Class: 0
Class: 0
Class: 0
Class: 0
Class: 0
Class: 0
Class: 0
Class: 0
Class: 0
Class: 0
Class: 0
Class: 0
Class: 1
Class: 2
Class: 3
Class: 3
Class: 3
Class: 6
Cockroach: 0.47 kU/L — ABNORMAL HIGH
D. farinae: 4.38 kU/L — ABNORMAL HIGH
Dog Dander: 9.57 kU/L — ABNORMAL HIGH
Elm IgE: <0.1 kU/L
IgE (Immunoglobulin E), Serum: 1061 kU/L — ABNORMAL HIGH (ref <=114)
Johnson Grass: <0.1 kU/L
Pecan/Hickory Tree IgE: <0.1 kU/L
Rough Pigweed  IgE: <0.1 kU/L
Sheep Sorrel IgE: 0.1 kU/L — ABNORMAL HIGH
Timothy Grass: 0.12 kU/L — ABNORMAL HIGH

## 2021-04-18 LAB — EOSINOPHIL COUNT
Eosinophils Absolute: 261 cells/uL (ref 15–500)
Eosinophils Relative: 3.9 %
WBC: 6.7 10*3/uL (ref 3.8–10.8)

## 2021-04-18 LAB — INTERPRETATION:

## 2021-04-25 ENCOUNTER — Other Ambulatory Visit: Payer: Self-pay | Admitting: Internal Medicine

## 2021-04-25 MED ORDER — PANTOPRAZOLE SODIUM 40 MG PO TBEC: 40.0000 mg | DELAYED_RELEASE_TABLET | Freq: Every day | ORAL | 1 refills | Status: DC

## 2021-04-26 ENCOUNTER — Telehealth: Payer: Self-pay | Admitting: Internal Medicine

## 2021-04-26 NOTE — Telephone Encounter (Signed)
Attempted to initiate PA on [email or web address] to see what alternative meds are covered, but per [employer] Advair is a preferred drug.  [facility] mail order pharmacy to see why pt is being told rx isn't covered, was advised by [facility] pharmacy tech that Advair is covered with a $0 copay and was mailed 5/16. Called and spoke with pt to make aware. She has not received rx mailed on 5/16, I advised her to contact pharmacy directly to follow up. Nothing further needed at this time- will close encounter.

## 2021-05-06 ENCOUNTER — Other Ambulatory Visit: Payer: Self-pay | Admitting: Internal Medicine

## 2021-05-06 MED ORDER — MONTELUKAST SODIUM 10 MG PO TABS: 10.0000 mg | ORAL_TABLET | Freq: Every day | ORAL | 3 refills | Status: DC

## 2021-05-06 NOTE — Telephone Encounter (Signed)
Received form from [facility] requesting that the montelukast be sent into them for refills per pts request.  This has been completed. Nothing further is needed.

## 2021-06-18 ENCOUNTER — Ambulatory Visit: Payer: Medicare HMO | Admitting: Internal Medicine

## 2021-06-20 ENCOUNTER — Ambulatory Visit: Payer: Medicare HMO | Admitting: Internal Medicine

## 2021-06-20 ENCOUNTER — Encounter: Payer: Self-pay | Admitting: Internal Medicine

## 2021-06-20 ENCOUNTER — Other Ambulatory Visit: Payer: Self-pay

## 2021-06-20 VITALS — BP 110/64 | HR 70 | Ht 65.0 in | Wt 194.6 lb

## 2021-06-20 DIAGNOSIS — J449 Chronic obstructive pulmonary disease, unspecified: Secondary | ICD-10-CM | POA: Diagnosis not present

## 2021-06-20 DIAGNOSIS — R768 Other specified abnormal immunological findings in serum: Secondary | ICD-10-CM

## 2021-06-20 DIAGNOSIS — J4550 Severe persistent asthma, uncomplicated: Secondary | ICD-10-CM | POA: Diagnosis not present

## 2021-06-20 DIAGNOSIS — J301 Allergic rhinitis due to pollen: Secondary | ICD-10-CM

## 2021-06-20 NOTE — Progress Notes (Signed)
Barbara [name]    [id number]    [birth year]  Primary Care Physician:Sun, Gari Crown, MD Date of Appointment: 06/20/2021 Established Patient Visit  Chief complaint:   Chief Complaint  Patient presents with   Follow-up    Follow up on Asthma-COPD, she reports doing better      HPI: Barbara [name] is a 70 y.o. woman with Asthma COPD overlap syndrome.  Interval Updates: Here for follow up. Symptoms improved since switching to advair. Still taking other mediations. No exacerbation or ED visits since last appointment. Thinks singulair is helping too.   Current Regimen: advair 230 2 puffs BID, spiriva, prn albuterol Asthma Triggers: allergies, being outside, exertion, cleaning solutions, heat Exacerbations in the last year: non requiring prednisone History of hospitalization or intubation:none Allergy Testing: never had.  GERD: yes on PPI Allergic Rhinitis: on cetirizine, singulair ACT:  Asthma Control Test ACT Total Score  06/20/2021 21  04/17/2021 12   FeNO: 137 ppb  I have reviewed the patient's family social and past medical history and updated as appropriate.   Past Medical History:  Diagnosis Date   Arthritis    right knee, neck   Asthma    COPD (chronic obstructive pulmonary disease) (HCC)    Hypertension    Mouth trouble    infection from tooth that was pulled approx. mid Mar. 2013    Past Surgical History:  Procedure Laterality Date   ABDOMINAL HYSTERECTOMY     SHOULDER SURGERY  in age 67's   cyst removed from [place] socket of left shoulder   TONSILLECTOMY  age 68   TUBAL LIGATION      Family History  Problem Relation Age of Onset   Dementia Mother    Kidney disease Mother    Asthma Sister    Mental illness Brother    Breast cancer Neg Hx     Social History   Occupational History   Not on file  Tobacco Use   Smoking status: Former Smoker    Packs/day: 1.00    Years: 45.00    Pack years: 45.00    Types: Cigarettes    Quit date:  10/27/2011    Years since quitting: 9.4   Smokeless tobacco: Never Used  Substance and Sexual Activity   Alcohol use: No   Drug use: No   Sexual activity: Never     Physical Exam: Blood pressure 110/64, pulse 70, height 5\' 5"  (1.651 m), weight 194 lb 9.6 oz (88.3 kg), SpO2 96 %.  Gen:      No acute distress ENT:  no nasal polyps, mucus membranes moist Lungs:    Diminished bilaterally, no wheezes or crackles, occasional expiratory wheeze over the neck CV:         Regular rate and rhythm; no murmurs, rubs, or gallops.  No pedal edema   Data Reviewed: Imaging: I have personally reviewed the chest xray April 2018 - mild cardiomegaly  PFTs:  PFT Results Latest Ref Rng & Units 04/17/2021  FVC-Pre L 1.58  FVC-Predicted Pre % 68  FVC-Post L 1.72  FVC-Predicted Post % 74  Pre FEV1/FVC % % 63  Post FEV1/FCV % % 66  FEV1-Pre L 0.99  FEV1-Predicted Pre % 55  FEV1-Post L 1.13   I have personally reviewed the patient's PFTs and moderately severe airflow limitation  Labs: IgE elevated 1,061 Sensitive to dust mites, cats, dogs, grass, cockroach, mouse  Immunization status: Immunization History  Administered Date(s) Administered  Hepatitis A, Adult 03/12/2016, 09/08/2016   Influenza Split 10/22/2011, 09/24/2012, 09/22/2013, 10/06/2013, 09/07/2014, 09/08/2015, 09/04/2017, 09/03/2018   PFIZER(Purple Top)SARS-COV-2 Vaccination 01/02/2020, 01/23/2020, 09/11/2020   Pneumococcal Conjugate-13 07/02/2015   Pneumococcal Polysaccharide-23 10/22/2011, 01/04/2019   Tdap 01/02/2016   Zoster, Live 01/02/2016, 10/21/2017, 12/22/2017    Assessment:  Asthma COPD Overlap Syndrome, improved control History of tobacco use disorder GERD Allergic Rhinitis - improved control   Plan/Recommendations: Continue Advair, spiriva, albuterol Continue PPI BID Continue claritin, and  singulair for rhinitis.  Her symptoms are much improved. Discussed results from her blood work. Dust mite precautions.  Consider biologic therapy if no sustained improvement. She does have a pet cat at home that she does not want to get rid of.    Return to Care: Return in about 4 months (around 10/21/2021).   Lenice Llamas, MD Pulmonary and [place]

## 2021-06-20 NOTE — Patient Instructions (Signed)
Please schedule follow up scheduled with myself in 4 months.  If my schedule is not open yet, we will contact you with a reminder closer to that time.  Continue spiriva and advair, continue albuterol as needed.      How Can I Prevent Allergic Reactions to Dust Mites?  Remember, having dust mites doesn't mean your house isn't clean. In most areas of the world, these creatures live in every home, no matter how clean. But, you can reduce their effects. There are many changes you can make to your home to reduce the numbers of these unwanted "guests."  Studies show that more dust mites live in your bedroom than anywhere else in your home. So this is the best place to start.  Cover mattresses and pillows in zippered dust-proof covers. These covers are made of a material with pores too small to let dust mites and their waste product through. They are also called allergen-impermeable. Plastic or vinyl covers are the least expensive, but some people find them uncomfortable. You can buy other fabric allergen-impermeable covers from many regular bedding stores.  Wash your sheets and blankets weekly in hot water. You have to wash them in water that's at least 130 degrees Fahrenheit or more to kill dust mites.  Get rid of all types of fabric that mites love and that you cannot easily wash regularly in hot water. Avoid wall-to-wall carpeting, curtains, blinds, upholstered furniture and down-filled covers and pillows in the bedroom. Put roll-type shades on your windows instead of curtains.  Have someone without a dust mite allergy clean your bedroom. If this is not possible, wear a filtering mask when dusting or vacuuming. Many drug stores carry these items. Dusting and vacuuming stir up dust. So try to do these chores when you can stay out of the bedroom for a while afterward.  Special CERTIFIED filter vacuum cleaners can help to keep mites and mite waste from getting back into the air. CERTIFIED vacuums are  scientifically tested and verified as more suitable for making your home healthier.  Vacuuming is not enough to remove all dust mites and their waste. A large amount of the dust mite population may remain because they live deep inside the stuffing of sofas, chairs, mattresses, pillows and carpeting. Treat other rooms in your house like your bedroom. Here are more tips:  Avoid wall-to-wall carpeting, if possible. If you do use carpeting, mites don't like the type with a short, tight pile as much. Use washable throw rugs over regularly damp-mopped wood, linoleum or tiled floors.  Wash rugs in hot water whenever possible. Cold water isn't as effective. Dry cleaning kills all dust mites and is also good for removing dust mites from living in fabrics. Keep the humidity in your home less than 50 percent. Use a dehumidifier and/or air conditioner to do this.  Use a CERTIFIED filter with your central furnace and air conditioning unit. This can help trap dust mites from your entire home. Freestanding air cleaners only filter air in a limited area. Avoid devices that treat air with heat, electrostatic ions or ozone.  Websites for allergy bedding covers:  Https://www.asthmaandallergyfriendly.com/  https://www.natlallergy.com/

## 2021-07-15 DIAGNOSIS — J449 Chronic obstructive pulmonary disease, unspecified: Secondary | ICD-10-CM | POA: Diagnosis not present

## 2021-07-15 DIAGNOSIS — N1831 Chronic kidney disease, stage 3a: Secondary | ICD-10-CM | POA: Diagnosis not present

## 2021-07-15 DIAGNOSIS — M25552 Pain in left hip: Secondary | ICD-10-CM | POA: Diagnosis not present

## 2021-07-15 DIAGNOSIS — I129 Hypertensive chronic kidney disease with stage 1 through stage 4 chronic kidney disease, or unspecified chronic kidney disease: Secondary | ICD-10-CM | POA: Diagnosis not present

## 2021-07-15 DIAGNOSIS — K219 Gastro-esophageal reflux disease without esophagitis: Secondary | ICD-10-CM | POA: Diagnosis not present

## 2021-07-15 DIAGNOSIS — R200 Anesthesia of skin: Secondary | ICD-10-CM | POA: Diagnosis not present

## 2021-07-17 DIAGNOSIS — I129 Hypertensive chronic kidney disease with stage 1 through stage 4 chronic kidney disease, or unspecified chronic kidney disease: Secondary | ICD-10-CM | POA: Diagnosis not present

## 2021-07-17 DIAGNOSIS — N1831 Chronic kidney disease, stage 3a: Secondary | ICD-10-CM | POA: Diagnosis not present

## 2021-08-01 DIAGNOSIS — L930 Discoid lupus erythematosus: Secondary | ICD-10-CM | POA: Diagnosis not present

## 2021-08-01 DIAGNOSIS — R202 Paresthesia of skin: Secondary | ICD-10-CM | POA: Diagnosis not present

## 2021-08-14 DIAGNOSIS — H269 Unspecified cataract: Secondary | ICD-10-CM | POA: Diagnosis not present

## 2021-08-14 DIAGNOSIS — J449 Chronic obstructive pulmonary disease, unspecified: Secondary | ICD-10-CM | POA: Diagnosis not present

## 2021-08-14 DIAGNOSIS — M1611 Unilateral primary osteoarthritis, right hip: Secondary | ICD-10-CM | POA: Diagnosis not present

## 2021-08-14 DIAGNOSIS — I129 Hypertensive chronic kidney disease with stage 1 through stage 4 chronic kidney disease, or unspecified chronic kidney disease: Secondary | ICD-10-CM | POA: Diagnosis not present

## 2021-08-14 DIAGNOSIS — K219 Gastro-esophageal reflux disease without esophagitis: Secondary | ICD-10-CM | POA: Diagnosis not present

## 2021-08-14 DIAGNOSIS — H259 Unspecified age-related cataract: Secondary | ICD-10-CM | POA: Diagnosis not present

## 2021-08-14 DIAGNOSIS — N1831 Chronic kidney disease, stage 3a: Secondary | ICD-10-CM | POA: Diagnosis not present

## 2021-09-11 DIAGNOSIS — Z23 Encounter for immunization: Secondary | ICD-10-CM | POA: Diagnosis not present

## 2021-09-30 DIAGNOSIS — I129 Hypertensive chronic kidney disease with stage 1 through stage 4 chronic kidney disease, or unspecified chronic kidney disease: Secondary | ICD-10-CM | POA: Diagnosis not present

## 2021-09-30 DIAGNOSIS — M1611 Unilateral primary osteoarthritis, right hip: Secondary | ICD-10-CM | POA: Diagnosis not present

## 2021-09-30 DIAGNOSIS — H269 Unspecified cataract: Secondary | ICD-10-CM | POA: Diagnosis not present

## 2021-09-30 DIAGNOSIS — H259 Unspecified age-related cataract: Secondary | ICD-10-CM | POA: Diagnosis not present

## 2021-09-30 DIAGNOSIS — K219 Gastro-esophageal reflux disease without esophagitis: Secondary | ICD-10-CM | POA: Diagnosis not present

## 2021-09-30 DIAGNOSIS — J449 Chronic obstructive pulmonary disease, unspecified: Secondary | ICD-10-CM | POA: Diagnosis not present

## 2021-09-30 DIAGNOSIS — J4550 Severe persistent asthma, uncomplicated: Secondary | ICD-10-CM | POA: Diagnosis not present

## 2021-09-30 DIAGNOSIS — N1831 Chronic kidney disease, stage 3a: Secondary | ICD-10-CM | POA: Diagnosis not present

## 2021-10-08 ENCOUNTER — Encounter: Payer: Self-pay | Admitting: Internal Medicine

## 2021-10-08 ENCOUNTER — Other Ambulatory Visit: Payer: Self-pay

## 2021-10-08 ENCOUNTER — Ambulatory Visit: Payer: Medicare HMO | Admitting: Internal Medicine

## 2021-10-08 VITALS — BP 110/70 | HR 63 | Temp 98.6°F | Ht 65.0 in | Wt 165.4 lb

## 2021-10-08 DIAGNOSIS — J310 Chronic rhinitis: Secondary | ICD-10-CM | POA: Diagnosis not present

## 2021-10-08 DIAGNOSIS — R768 Other specified abnormal immunological findings in serum: Secondary | ICD-10-CM

## 2021-10-08 DIAGNOSIS — K219 Gastro-esophageal reflux disease without esophagitis: Secondary | ICD-10-CM | POA: Diagnosis not present

## 2021-10-08 DIAGNOSIS — J449 Chronic obstructive pulmonary disease, unspecified: Secondary | ICD-10-CM

## 2021-10-08 NOTE — Progress Notes (Signed)
Barbara [name]    [id number]    18-Aug-1951  Primary Care Physician:Sun, Gari Crown, MD Date of Appointment: 10/08/2021 Established Patient Visit  Chief complaint:   Chief Complaint  Patient presents with   Follow-up    Pt states she has been doing well since last visit and denies any complaints.     HPI: Barbara [name] is a 70 y.o. woman with Asthma COPD overlap syndrome.  Interval Updates: Feeling good no issues no interval ed visits, hospital stays. Minimal rescue inhaler use. Nebulizer used only once.   Current Regimen: advair 230 2 puffs BID, spiriva, prn albuterol Asthma Triggers: allergies, being outside, exertion, cleaning solutions, heat Exacerbations in the last year: non requiring prednisone History of hospitalization or intubation:none Allergy Testing: never had.  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   I have reviewed the patient's family social and past medical history and updated as appropriate.   Past Medical History:  Diagnosis Date   Arthritis    right knee, neck   Asthma    COPD (chronic obstructive pulmonary disease) (HCC)    Hypertension    Mouth trouble    infection from tooth that was pulled approx. mid Mar. 2013    Past Surgical History:  Procedure Laterality Date   ABDOMINAL HYSTERECTOMY     SHOULDER SURGERY  in age 61's   cyst removed from [place] socket of left shoulder   TONSILLECTOMY  age 61   TUBAL LIGATION      Family History  Problem Relation Age of Onset   Dementia Mother    Kidney disease Mother    Asthma Sister    Mental illness Brother    Breast cancer Neg Hx     Social History   Occupational History   Not on file  Tobacco Use   Smoking status:  Former    Packs/day: 1.00    Years: 45.00    Pack years: 45.00    Types: Cigarettes    Quit date: 10/27/2011    Years since quitting: 9.9   Smokeless tobacco: Never  Substance and Sexual Activity   Alcohol use: No   Drug use: No   Sexual activity: Never     Physical Exam: Blood pressure 110/70, pulse 63, temperature 98.6 F (37 C), temperature source Oral, height 5\' 5"  (1.651 m), weight 165 lb 6.4 oz (75 kg), SpO2 97 %.  Gen:      No acute distress Lungs:    RRR no mrg, no wheezes or crackles CV:         Regular rate and rhythm; no murmurs, rubs, or gallops.  No pedal edema   Data Reviewed: Imaging: I have personally reviewed the chest xray April 2018 - mild cardiomegaly  PFTs:   PFT Results Latest Ref Rng & Units 04/17/2021  FVC-Pre L 1.58  FVC-Predicted Pre % 68  FVC-Post L 1.72  FVC-Predicted Post % 74  Pre FEV1/FVC % % 63  Post FEV1/FCV % % 66  FEV1-Pre L 0.99  FEV1-Predicted Pre % 55  FEV1-Post L 1.13   I have personally reviewed the patient's PFTs and moderately severe airflow limitation  Labs:  IgE elevated 1,061 Sensitive to dust mites, cats, dogs, grass, cockroach, mouse  Immunization status: Immunization History  Administered Date(s) Administered   Hepatitis A, Adult 03/12/2016, 09/08/2016   Influenza Split 10/22/2011, 09/24/2012, 09/22/2013, 10/06/2013, 09/07/2014, 09/08/2015, 09/04/2017, 09/03/2018   Influenza, High Dose Seasonal PF 08/08/2021   PFIZER(Purple Top)SARS-COV-2 Vaccination 01/02/2020, 01/23/2020, 03/20/2021, 08/16/2021   Pneumococcal Conjugate-13 07/02/2015   Pneumococcal Polysaccharide-23 10/22/2011, 01/04/2019   Tdap 01/02/2016   Zoster, Live 01/02/2016, 10/21/2017, 12/22/2017    Assessment:  Asthma COPD Overlap Syndrome, controlled  History of tobacco use disorder GERD controlled Allergic Rhinitis controlled   Plan/Recommendations: continue Advair, spiriva, albuterol continue PPI once daily continue claritin, and   singulair for rhinitis.  She already got her flu shot.   Return to Care: Return in about 6 months (around 04/07/2022).   Lenice Llamas, MD Pulmonary and [place]

## 2021-10-08 NOTE — Patient Instructions (Signed)
Please schedule follow up scheduled with myself in 6 months.  If my schedule is not open yet, we will contact you with a reminder closer to that time.  Keep up the good work! Continue advair, spiriva, singulair, albuterol and claritin. Continue your acid reflux medication If you have any issues with your breathing, please call me sooner so we can help you get it under control.

## 2021-10-09 DIAGNOSIS — H2513 Age-related nuclear cataract, bilateral: Secondary | ICD-10-CM | POA: Diagnosis not present

## 2021-10-09 DIAGNOSIS — H25013 Cortical age-related cataract, bilateral: Secondary | ICD-10-CM | POA: Diagnosis not present

## 2021-10-09 DIAGNOSIS — H35033 Hypertensive retinopathy, bilateral: Secondary | ICD-10-CM | POA: Diagnosis not present

## 2021-10-09 DIAGNOSIS — H524 Presbyopia: Secondary | ICD-10-CM | POA: Diagnosis not present

## 2021-10-09 DIAGNOSIS — H35361 Drusen (degenerative) of macula, right eye: Secondary | ICD-10-CM | POA: Diagnosis not present

## 2021-10-09 DIAGNOSIS — H52213 Irregular astigmatism, bilateral: Secondary | ICD-10-CM | POA: Diagnosis not present

## 2021-11-11 ENCOUNTER — Other Ambulatory Visit: Payer: Self-pay | Admitting: Internal Medicine

## 2021-12-24 DIAGNOSIS — H2512 Age-related nuclear cataract, left eye: Secondary | ICD-10-CM | POA: Diagnosis not present

## 2021-12-24 DIAGNOSIS — H25812 Combined forms of age-related cataract, left eye: Secondary | ICD-10-CM | POA: Diagnosis not present

## 2022-01-15 DIAGNOSIS — J449 Chronic obstructive pulmonary disease, unspecified: Secondary | ICD-10-CM | POA: Diagnosis not present

## 2022-01-15 DIAGNOSIS — N183 Chronic kidney disease, stage 3 unspecified: Secondary | ICD-10-CM | POA: Diagnosis not present

## 2022-01-15 DIAGNOSIS — L930 Discoid lupus erythematosus: Secondary | ICD-10-CM | POA: Diagnosis not present

## 2022-01-15 DIAGNOSIS — M8588 Other specified disorders of bone density and structure, other site: Secondary | ICD-10-CM | POA: Diagnosis not present

## 2022-01-15 DIAGNOSIS — K219 Gastro-esophageal reflux disease without esophagitis: Secondary | ICD-10-CM | POA: Diagnosis not present

## 2022-01-15 DIAGNOSIS — Z0000 Encounter for general adult medical examination without abnormal findings: Secondary | ICD-10-CM | POA: Diagnosis not present

## 2022-01-15 DIAGNOSIS — I129 Hypertensive chronic kidney disease with stage 1 through stage 4 chronic kidney disease, or unspecified chronic kidney disease: Secondary | ICD-10-CM | POA: Diagnosis not present

## 2022-01-15 DIAGNOSIS — R42 Dizziness and giddiness: Secondary | ICD-10-CM | POA: Diagnosis not present

## 2022-01-15 DIAGNOSIS — Z1389 Encounter for screening for other disorder: Secondary | ICD-10-CM | POA: Diagnosis not present

## 2022-01-17 ENCOUNTER — Encounter: Payer: Self-pay | Admitting: Neurology

## 2022-01-27 ENCOUNTER — Other Ambulatory Visit: Payer: Self-pay | Admitting: Internal Medicine

## 2022-01-27 DIAGNOSIS — J449 Chronic obstructive pulmonary disease, unspecified: Secondary | ICD-10-CM

## 2022-01-27 DIAGNOSIS — J4550 Severe persistent asthma, uncomplicated: Secondary | ICD-10-CM

## 2022-02-12 DIAGNOSIS — H2511 Age-related nuclear cataract, right eye: Secondary | ICD-10-CM | POA: Diagnosis not present

## 2022-02-12 DIAGNOSIS — H25011 Cortical age-related cataract, right eye: Secondary | ICD-10-CM | POA: Diagnosis not present

## 2022-02-19 ENCOUNTER — Other Ambulatory Visit: Payer: Self-pay | Admitting: Internal Medicine

## 2022-03-18 DIAGNOSIS — H25011 Cortical age-related cataract, right eye: Secondary | ICD-10-CM | POA: Diagnosis not present

## 2022-03-18 DIAGNOSIS — H2511 Age-related nuclear cataract, right eye: Secondary | ICD-10-CM | POA: Diagnosis not present

## 2022-03-18 DIAGNOSIS — H25811 Combined forms of age-related cataract, right eye: Secondary | ICD-10-CM | POA: Diagnosis not present

## 2022-04-08 ENCOUNTER — Encounter: Payer: Self-pay | Admitting: Internal Medicine

## 2022-04-08 ENCOUNTER — Ambulatory Visit: Payer: Medicare HMO | Admitting: Internal Medicine

## 2022-04-08 VITALS — BP 118/64 | HR 60 | Temp 98.2°F | Ht 65.0 in | Wt 171.8 lb

## 2022-04-08 DIAGNOSIS — J449 Chronic obstructive pulmonary disease, unspecified: Secondary | ICD-10-CM

## 2022-04-08 NOTE — Patient Instructions (Signed)
Please schedule follow up scheduled with myself in 6 months.  If my schedule is not open yet, we will contact you with a reminder closer to that time. Please call [phone] if you haven't heard from [place] a month before.  ? ?Continue advair, spiriva and albuterol as needed. ? ?Continue your allergy meds ? ?Continue your PPI for your reflux.  ? ?Call me if any questions or concerns about your breathing before our follow up. ? ?Glad you are doing great! ?

## 2022-04-08 NOTE — Progress Notes (Signed)
? ?      ?Barbara [name]    [id number]    [birth year] ? ?Primary Care Physician:Sun, Gari Crown, MD ?Date of Appointment: 04/08/2022 ?Established Patient Visit ? ?Chief complaint:   ?Chief Complaint  ?Patient presents with  ? Follow-up  ?  Pt states that her breathing is doing well since last visit. She is on Advair, and Spriva. Pt states inhalers are working well.   ? ? ? ?HPI: ?[name] is a 71 y.o. woman with Asthma COPD overlap syndrome. ? ?Interval Updates: ?Here for 6 month follow up. Doing well on advair and spiriva.  ? ?Albuterol rescue inhaler use is minimal less than once a month. She uses towards the end of the month when her advair is getting low. When she has 20 or less doses less of advair she feels its less effective. Heat also triggers her breathing.  ? ?Current Regimen: advair 230 2 puffs BID, spiriva, prn albuterol ?Asthma Triggers: allergies, being outside, exertion, cleaning solutions, heat ?Exacerbations in the last year: non requiring prednisone ?History of hospitalization or intubation:none ?Allergy Testing: never had.  ?GERD: yes on PPI once daily ?Allergic Rhinitis: on cetirizine, singulair ?ACT:  ?Asthma Control Test ACT Total Score  ?04/08/2022 ? 1:31 PM 20  ?10/08/2021 ? 1:52 PM 21  ?06/20/2021 ? 1:53 PM 21  ? ?FeNO: 137 ppb ? 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  ? ? ?I have reviewed the patient's family social and past medical history and updated as appropriate.  ? ?Past Medical History:  ?Diagnosis Date  ? Arthritis   ? right knee, neck  ? Asthma   ? COPD (chronic obstructive pulmonary disease) ([place])   ? Hypertension   ? Mouth trouble   ? infection from tooth that was pulled approx. mid Mar. 2013  ? ? ?Past Surgical History:  ?Procedure Laterality Date  ? ABDOMINAL HYSTERECTOMY    ? SHOULDER SURGERY  in age 54's  ? cyst removed from [place] socket of left shoulder  ? TONSILLECTOMY  age 26  ? TUBAL  LIGATION    ? ? ?Family History  ?Problem Relation Age of Onset  ? Dementia Mother   ? Kidney disease Mother   ? Asthma Sister   ? Mental illness Brother   ? Breast cancer Neg Hx   ? ? ?Social History  ? ?Occupational History  ? Not on file  ?Tobacco Use  ? Smoking status: Former  ?  Packs/day: 1.00  ?  Years: 45.00  ?  Pack years: 45.00  ?  Types: Cigarettes  ?  Quit date: 10/27/2011  ?  Years since quitting: 10.4  ? Smokeless tobacco: Never  ?Substance and Sexual Activity  ? Alcohol use: No  ? Drug use: No  ? Sexual activity: Never  ? ? ? ?Physical Exam: ?Blood pressure 118/64, pulse 60, temperature 98.2 ?F (36.8 ?C), temperature source Oral, height '5\' 5"'$  (1.651 m), weight 171 lb 12.8 oz (77.9 kg), SpO2 97 %. ? ?Gen:      No acute distress ?Lungs:    CTAB no wheezes or crackles ?CV:         RRR no mrg ? ? ?Data Reviewed: ?Imaging: ?I have personally reviewed the chest xray April 2018 - mild cardiomegaly ? ?PFTs: ? ? ? ?  Latest Ref Rng & Units 04/17/2021  ?  2:55 PM  ?PFT Results  ?  FVC-Pre L 1.58    ?FVC-Predicted Pre % 68    ?FVC-Post L 1.72    ?FVC-Predicted Post % 74    ?Pre FEV1/FVC % % 63    ?Post FEV1/FCV % % 66    ?FEV1-Pre L 0.99    ?FEV1-Predicted Pre % 55    ?FEV1-Post L 1.13    ? ?I have personally reviewed the patient's PFTs and moderately severe airflow limitation ? ?Labs: ?IgE elevated 1,061 ?Sensitive to dust mites, cats, dogs, grass, cockroach, mouse ? ?Immunization status: ?Immunization History  ?Administered Date(s) Administered  ? Hepatitis A, Adult 03/12/2016, 09/08/2016  ? Influenza Split 10/22/2011, 09/24/2012, 09/22/2013, 10/06/2013, 09/07/2014, 09/08/2015, 09/04/2017, 09/03/2018  ? Influenza, High Dose Seasonal PF 08/08/2021  ? PFIZER(Purple Top)SARS-COV-2 Vaccination 01/02/2020, 01/23/2020, 03/20/2021, 08/16/2021  ? Pneumococcal Conjugate-13 07/02/2015  ? Pneumococcal Polysaccharide-23 10/22/2011, 01/04/2019  ? Tdap 01/02/2016  ? Zoster, Live 01/02/2016, 10/21/2017, 12/22/2017   ? ? ?Assessment:  ?Asthma COPD Overlap Syndrome, controlled  ?History of tobacco use disorder, in remission.  ?GERD controlled ?Allergic Rhinitis controlled ? ? ?Plan/Recommendations: ?continue Advair, spiriva, albuterol ?continue PPI once daily ?continue claritin, and  singulair for rhinitis.  ? ? ?Return to Care: ?Return in about 6 months (around 10/09/2022). ? ? ?Lenice Llamas, MD ?Pulmonary and Critical Care Medicine ?[place] ?Office:[phone] ? ? ? ? ? ?

## 2022-04-14 NOTE — Progress Notes (Signed)
? ?NEUROLOGY CONSULTATION NOTE ? ?[name] ?MRN: [id number] ?DOB: January 06, 1951 ? ?Referring provider: Donald Prose, MD ?Primary care provider: Donald Prose, MD ? ?Reason for consult:  dizziness, tinglling ? ?Assessment/Plan:  ? ?Episodic dizziness  ?Right hand numbness - possible carpal tunnel syndrome ?Left meralgia paresthetica  ? ?1  If she has recurrence of dizzy spells, would send to vestibular rehab ?2  Consider wearing a wrist splint at night ?3  Follow up as needed. ? ? ?Subjective:  ?Barbara [name] is a 71 year old female with HTN, COPD, asthma, CKD, SLE and arthritis who presents for dizziness and tingling.  History supplemented by referring provider's note. ? ?Every January since 2021, she has had episodes of dizziness.  It is a sensation of wooziness but not spinning.  It may occur spontaneously but usually occurs with change in position such as bending over or getting out of bed.  Lasts less than 30 seconds.  No associated headache, visual disturbance, numbness or weakness.  It would occur several times and typically last just a month. MRI of brain without contrast on 01/06/2020 showed generalized age-related cerebral atrophy and moderate chronic small vessel ischemic changes in the cerebral hemispheres and pons.  She saw ENT in March 2021.  Workup, including audiometric testing and Dix-Hallpike were negative.   It was severe in 2021, however it has not been as severe in 2022 and 2023.  However, this past year, symptoms persisted past January.  It did not subside until mid-April.  She may feel slight dizziness if she bends over or gets up in the morning but nothing significant.   ? ?She reports intermittent numbness in the right hand.  Usually notices it first thing in the morning when she wakes up in bed.  Shakes it out and it resolves after a minute.  She plays piano and organ.  She also sometimes has tingling and soreness along the left lateral thigh.   ? ? ?PAST MEDICAL HISTORY: ?Past Medical  History:  ?Diagnosis Date  ? Arthritis   ? right knee, neck  ? Asthma   ? COPD (chronic obstructive pulmonary disease) ([place])   ? Hypertension   ? Mouth trouble   ? infection from tooth that was pulled approx. mid Mar. 2013  ? ? ?PAST SURGICAL HISTORY: ?Past Surgical History:  ?Procedure Laterality Date  ? ABDOMINAL HYSTERECTOMY    ? SHOULDER SURGERY  in age 72's  ? cyst removed from [place] socket of left shoulder  ? TONSILLECTOMY  age 89  ? TUBAL LIGATION    ? ? ?MEDICATIONS: ?Current Outpatient Medications on File Prior to Visit  ?Medication Sig Dispense Refill  ? acetaminophen (TYLENOL) 500 MG tablet Take 1,000 mg by mouth in the morning and at bedtime.    ? albuterol (PROVENTIL HFA;VENTOLIN HFA) 108 (90 BASE) MCG/ACT inhaler Inhale 2 puffs into the lungs every 6 (six) hours as needed. For shortness of breath    ? albuterol (PROVENTIL) (2.5 MG/3ML) 0.083% nebulizer solution Take 3 mLs (2.5 mg total) by nebulization every 6 (six) hours as needed. Breathing 75 mL 3  ? amLODipine (NORVASC) 10 MG tablet Take 10 mg by mouth daily.    ? Calcium Carbonate-Vit D-Min (CALCIUM 1200 PO) Take 1 tablet by mouth daily.    ? Cholecalciferol (VITAMIN D3) 50 MCG (2000 UT) TABS Take by mouth.    ? Cod Liver Oil CAPS Take 1 capsule by mouth daily.    ? Cyanocobalamin (VITAMIN B 12 PO) Take 250  mcg by mouth daily.    ? fluticasone-salmeterol (ADVAIR HFA) 230-21 MCG/ACT inhaler INHALE 2 PUFFS TWICE DAILY 36 g 3  ? hydrochlorothiazide (HYDRODIURIL) 25 MG tablet Take 25 mg by mouth daily.    ? loratadine (CLARITIN) 10 MG tablet Take 10 mg by mouth daily.    ? montelukast (SINGULAIR) 10 MG tablet TAKE 1 TABLET (10 MG TOTAL) BY MOUTH AT BEDTIME. 90 tablet 3  ? Omega-3 Fatty Acids (FISH OIL) 1200 MG CAPS Take 1 capsule by mouth daily.    ? pantoprazole (PROTONIX) 40 MG tablet TAKE 1 TABLET (40 MG TOTAL) BY MOUTH DAILY. 90 tablet 1  ? prednisoLONE acetate (PRED FORTE) 1 % ophthalmic suspension SMARTSIG:In Eye(s)    ?  Pseudoephedrine-Guaifenesin [phone] MG TB12 Take 1,200 mg by mouth daily.    ? SPIRIVA RESPIMAT 2.5 MCG/ACT AERS     ? TURMERIC CURCUMIN PO Take 1 capsule by mouth daily.    ? ?No current facility-administered medications on file prior to visit.  ? ? ?ALLERGIES: ?Allergies  ?Allergen Reactions  ? Nsaids Shortness Of Breath  ? Clonidine Derivatives   ?  Coughing shortness of breath  ? Codeine Other (See Comments)  ?  Reaction: hallucination   ? Ibuprofen   ?  Other reaction(s): "NSAIDS" took breath away  ? Lisinopril Hives  ? Penicillins Hives  ? Vicodin [Hydrocodone-Acetaminophen] Nausea And Vomiting  ? Procaine Rash  ? ? ?FAMILY HISTORY: ?Family History  ?Problem Relation Age of Onset  ? Dementia Mother   ? Kidney disease Mother   ? Asthma Sister   ? Mental illness Brother   ? Breast cancer Neg Hx   ? ? ?Objective:  ?Blood pressure 134/74, pulse 60, height '5\' 5"'$  (1.651 m), weight 173 lb (78.5 kg), SpO2 97 %. ?General: No acute distress.  Patient appears well-groomed.   ?Head:  Normocephalic/atraumatic ?Eyes:  fundi examined but not visualized ?Neck: supple, no paraspinal tenderness, full range of motion ?Heart: regular rate and rhythm ?Neurological Exam: ?Mental status: alert and oriented to person, place, and time, recent and remote memory intact, fund of knowledge intact, attention and concentration intact, speech fluent and not dysarthric, language intact. ?Cranial nerves: ?CN I: not tested ?CN II: pupils equal, round and reactive to light, visual fields intact ?CN III, IV, VI:  full range of motion, no nystagmus, no ptosis ?CN V: facial sensation intact. ?CN VII: upper and lower face symmetric ?CN VIII: hearing intact ?CN IX, X: gag intact, uvula midline ?CN XI: sternocleidomastoid and trapezius muscles intact ?CN XII: tongue midline ?Bulk & Tone: normal, no fasciculations. ?Motor:  muscle strength 5/5 throughout ?Sensation:  Temperature and vibratory sensation intact. ?Deep Tendon Reflexes:  2+ throughout,   toes downgoing.   ?Finger to nose testing:  Without dysmetria.   ?Heel to shin:  Without dysmetria.   ?Gait:  Normal station and stride.  Romberg negative. ? ? ? ?Thank you for allowing me to take part in the care of this patient. ? ?Metta Clines, DO ? ?CC: Donald Prose, MD ? ? ? ? ?

## 2022-04-15 ENCOUNTER — Encounter: Payer: Self-pay | Admitting: Neurology

## 2022-04-15 ENCOUNTER — Ambulatory Visit: Payer: Medicare HMO | Admitting: Neurology

## 2022-04-15 VITALS — BP 134/74 | HR 60 | Ht 65.0 in | Wt 173.0 lb

## 2022-04-15 DIAGNOSIS — R42 Dizziness and giddiness: Secondary | ICD-10-CM

## 2022-04-15 DIAGNOSIS — G5712 Meralgia paresthetica, left lower limb: Secondary | ICD-10-CM

## 2022-04-15 DIAGNOSIS — G5601 Carpal tunnel syndrome, right upper limb: Secondary | ICD-10-CM

## 2022-04-15 NOTE — Patient Instructions (Signed)
If dizzy spells become an increased problem again, contact me and I can send you to physical therapy ?Try wearing a wrist splint on the right wrist at night to see if it prevents numbness in the mornings ?Otherwise follow up as needed. ?

## 2022-04-24 ENCOUNTER — Telehealth: Payer: Self-pay | Admitting: Neurology

## 2022-04-24 DIAGNOSIS — R42 Dizziness and giddiness: Secondary | ICD-10-CM

## 2022-04-24 NOTE — Telephone Encounter (Signed)
Patient needs a referral for physical therapy. Her and jaffe talked about it at her visit.

## 2022-04-24 NOTE — Telephone Encounter (Signed)
Send for vSend for vestibular rehab for dizziness.  Referral added

## 2022-05-02 DIAGNOSIS — I129 Hypertensive chronic kidney disease with stage 1 through stage 4 chronic kidney disease, or unspecified chronic kidney disease: Secondary | ICD-10-CM | POA: Diagnosis not present

## 2022-05-02 DIAGNOSIS — J449 Chronic obstructive pulmonary disease, unspecified: Secondary | ICD-10-CM | POA: Diagnosis not present

## 2022-05-02 DIAGNOSIS — N1831 Chronic kidney disease, stage 3a: Secondary | ICD-10-CM | POA: Diagnosis not present

## 2022-05-02 DIAGNOSIS — K219 Gastro-esophageal reflux disease without esophagitis: Secondary | ICD-10-CM | POA: Diagnosis not present

## 2022-05-22 DIAGNOSIS — H35033 Hypertensive retinopathy, bilateral: Secondary | ICD-10-CM | POA: Diagnosis not present

## 2022-05-22 DIAGNOSIS — Z961 Presence of intraocular lens: Secondary | ICD-10-CM | POA: Diagnosis not present

## 2022-05-22 DIAGNOSIS — H3581 Retinal edema: Secondary | ICD-10-CM | POA: Diagnosis not present

## 2022-07-13 ENCOUNTER — Other Ambulatory Visit: Payer: Self-pay | Admitting: Internal Medicine

## 2022-07-14 DIAGNOSIS — M25562 Pain in left knee: Secondary | ICD-10-CM | POA: Diagnosis not present

## 2022-07-14 DIAGNOSIS — J449 Chronic obstructive pulmonary disease, unspecified: Secondary | ICD-10-CM | POA: Diagnosis not present

## 2022-07-14 DIAGNOSIS — J4520 Mild intermittent asthma, uncomplicated: Secondary | ICD-10-CM | POA: Diagnosis not present

## 2022-07-14 DIAGNOSIS — K219 Gastro-esophageal reflux disease without esophagitis: Secondary | ICD-10-CM | POA: Diagnosis not present

## 2022-07-14 DIAGNOSIS — I129 Hypertensive chronic kidney disease with stage 1 through stage 4 chronic kidney disease, or unspecified chronic kidney disease: Secondary | ICD-10-CM | POA: Diagnosis not present

## 2022-07-14 DIAGNOSIS — N1831 Chronic kidney disease, stage 3a: Secondary | ICD-10-CM | POA: Diagnosis not present

## 2022-07-29 DIAGNOSIS — R202 Paresthesia of skin: Secondary | ICD-10-CM | POA: Diagnosis not present

## 2022-07-29 DIAGNOSIS — L930 Discoid lupus erythematosus: Secondary | ICD-10-CM | POA: Diagnosis not present

## 2022-07-29 DIAGNOSIS — L853 Xerosis cutis: Secondary | ICD-10-CM | POA: Diagnosis not present

## 2022-08-06 DIAGNOSIS — L930 Discoid lupus erythematosus: Secondary | ICD-10-CM | POA: Diagnosis not present

## 2022-09-02 ENCOUNTER — Telehealth: Payer: Self-pay | Admitting: Internal Medicine

## 2022-09-02 DIAGNOSIS — N1831 Chronic kidney disease, stage 3a: Secondary | ICD-10-CM | POA: Diagnosis not present

## 2022-09-02 DIAGNOSIS — J449 Chronic obstructive pulmonary disease, unspecified: Secondary | ICD-10-CM | POA: Diagnosis not present

## 2022-09-02 DIAGNOSIS — I129 Hypertensive chronic kidney disease with stage 1 through stage 4 chronic kidney disease, or unspecified chronic kidney disease: Secondary | ICD-10-CM | POA: Diagnosis not present

## 2022-09-02 DIAGNOSIS — K219 Gastro-esophageal reflux disease without esophagitis: Secondary | ICD-10-CM | POA: Diagnosis not present

## 2022-09-02 NOTE — Telephone Encounter (Signed)
FYI that pt is increasing albuterol usage. 2-3 times daily, over the past few weeks. Increased SOB and chest tightness. Pt has OV on 10/10. Because of increased usage of albuterol her blood pressure, anxiety and heart rate have increased as well. Still usuing advair and spiriva as prescribed.

## 2022-09-05 NOTE — Telephone Encounter (Signed)
Called patient but she did not answer. Left message for her to call back.  

## 2022-09-05 NOTE — Telephone Encounter (Signed)
Primary Pulmonologist: [name] Last office visit and with whom: 04/08/2022 What do we see them for (pulmonary problems): Asthma-COPD overlap syndrome Last OV assessment/plan:   Assessment:  Asthma COPD Overlap Syndrome, controlled  History of tobacco use disorder, in remission.  GERD controlled Allergic Rhinitis controlled     Plan/Recommendations: continue Advair, spiriva, albuterol continue PPI once daily continue claritin, and  singulair for rhinitis.      Return to Care: Return in about 6 months (around 10/09/2022).     Barbara Llamas, MD Pulmonary and [place]                Patient Instructions by [name], MD at 04/08/2022 1:45 PM  Author: [name], MD Author Type: Physician Filed: 04/08/2022  1:50 PM  Note Status: Signed Cosign: Cosign Not Required Encounter Date: 04/08/2022  Editor: [name], MD (Physician)               Please schedule follow up scheduled with myself in 6 months.  If my schedule is not open yet, we will contact you with a reminder closer to that time. Please call [phone] if you haven't heard from [place] a month before.    Continue advair, spiriva and albuterol as needed.   Continue your allergy meds   Continue your PPI for your reflux.    Call me if any questions or concerns about your breathing before our follow up.   Glad you are doing great!       Orthostatic Vitals Recorded in This Encounter   04/08/2022  1333     Patient Position: Sitting  BP Location: Left Arm  Cuff Size: Normal   Instructions    Return in about 6 months (around 10/09/2022).  Please schedule follow up scheduled with myself in 6 months.  If my schedule is not open yet, we will contact you with a reminder closer to that time. Please call [phone] if you haven't heard from [place] a month before.    Continue advair, spiriva and albuterol as needed.   Continue your allergy meds    Continue your PPI for your reflux.    Call me if any questions or concerns about your breathing before our follow up.   Glad you are doing great!      Was appointment offered to patient (explain)?  She has an appointment scheduled on 10/10 with Dr. [name]   Reason for call: She usually does not have to use her rescue medication at all.  Her usage went up with the hot weather in July/August.  She was using Albuterol inhaler 4-5 times per day, and some days it is 1 time per day.  This started in July/August.  Has some cough/wheezing.  She has used her inhaler mostly and used her nebulizer 1-2 times.  She is using the Advair as prescribed.  She is also using spiriva.  Cough is dry.  She is taking Mucinex.  She gets sob with walking and talking.  She was sob while she was speaking to me on the phone.  She said she was at [facility].  She has an appointment with Dr. [name] on 09/16/22.  Sarah, please advise if any changes need to be made before her f/u with Dr. [name].  Thank you.    (examples of things to ask: : When did symptoms start? Fever? Cough? Productive? Color to sputum? More sputum than usual? Wheezing? Have you needed increased oxygen? Are you  taking your respiratory medications? What over the counter measures have you tried?)  Allergies  Allergen Reactions   Nsaids Shortness Of Breath   Clonidine Derivatives     Coughing shortness of breath   Codeine Other (See Comments)    Reaction: hallucination    Ibuprofen     Other reaction(s): "NSAIDS" took breath away   Lisinopril Hives   Penicillins Hives   Vicodin [Hydrocodone-Acetaminophen] Nausea And Vomiting   Procaine Rash    Immunization History  Administered Date(s) Administered   Hepatitis A, Adult 03/12/2016, 09/08/2016   Influenza Split 10/22/2011, 09/24/2012, 09/22/2013, 10/06/2013, 09/07/2014, 09/08/2015, 09/04/2017, 09/03/2018   Influenza, High Dose Seasonal PF 08/08/2021   PFIZER(Purple Top)SARS-COV-2 Vaccination  01/02/2020, 01/23/2020, 03/20/2021, 08/16/2021   Pneumococcal Conjugate-13 07/02/2015   Pneumococcal Polysaccharide-23 10/22/2011, 01/04/2019   Tdap 01/02/2016   Zoster, Live 01/02/2016, 10/21/2017, 12/22/2017

## 2022-09-08 DIAGNOSIS — J449 Chronic obstructive pulmonary disease, unspecified: Secondary | ICD-10-CM | POA: Diagnosis not present

## 2022-09-08 DIAGNOSIS — Z0100 Encounter for examination of eyes and vision without abnormal findings: Secondary | ICD-10-CM | POA: Diagnosis not present

## 2022-09-08 DIAGNOSIS — I10 Essential (primary) hypertension: Secondary | ICD-10-CM | POA: Diagnosis not present

## 2022-09-16 ENCOUNTER — Ambulatory Visit: Payer: Medicare HMO | Admitting: Internal Medicine

## 2022-09-18 ENCOUNTER — Encounter: Payer: Self-pay | Admitting: Internal Medicine

## 2022-09-18 ENCOUNTER — Telehealth: Payer: Self-pay | Admitting: Internal Medicine

## 2022-09-18 ENCOUNTER — Ambulatory Visit: Payer: Medicare HMO | Admitting: Internal Medicine

## 2022-09-18 VITALS — BP 112/70 | HR 64 | Temp 98.3°F | Ht 65.0 in | Wt 185.0 lb

## 2022-09-18 DIAGNOSIS — R0600 Dyspnea, unspecified: Secondary | ICD-10-CM

## 2022-09-18 DIAGNOSIS — J4489 Other specified chronic obstructive pulmonary disease: Secondary | ICD-10-CM

## 2022-09-18 LAB — POCT EXHALED NITRIC OXIDE: FeNO level (ppb): 24

## 2022-09-18 MED ORDER — PEAK FLOW METER DEVI: 0 refills | Status: DC

## 2022-09-18 NOTE — Telephone Encounter (Signed)
Order has been placed for peak flow meter.

## 2022-09-18 NOTE — Progress Notes (Signed)
Barbara [name]    [id number]    1951-03-01  Primary Care Physician:Sun, Gari Crown, MD Date of Appointment: 09/18/2022 Established Patient Visit  Chief complaint:   Chief Complaint  Patient presents with   Follow-up    More sob for post 1-2 months.  Still has wheezing after using Advair and has had to use Albuterol.     HPI: Barbara [name] is a 71 y.o. woman with Asthma COPD overlap syndrome.  Interval Updates: Here for 6 month follow up. Notes worsening shortness of breath over the last 1-2 months. Using her albuterol more frequently now 1-2 times/day previously less than once/month. Also having nightime use with wheezing. Feels that her symptoms are responsive to albuterol.   Denies worsening reflux, allergy symptoms, LE edema   Current Regimen: advair 230 2 puffs BID, spiriva, prn albuterol Asthma Triggers: allergies, being outside, exertion, cleaning solutions, heat Exacerbations in the last year: non requiring prednisone History of hospitalization or intubation:none Allergy Testing: never had.  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   I have reviewed the patient's family social and past medical history and updated as appropriate.   Past Medical History:  Diagnosis Date   Arthritis    right knee, neck   Asthma    COPD (chronic obstructive pulmonary disease) (HCC)    Hypertension    Mouth trouble    infection from tooth that was pulled approx. mid Mar. 2013    Past Surgical History:  Procedure Laterality Date   ABDOMINAL HYSTERECTOMY     SHOULDER SURGERY  in age 74's   cyst removed from [place] socket of left shoulder   TONSILLECTOMY  age 63    TUBAL LIGATION      Family History  Problem Relation Age of Onset   Dementia Mother    Kidney disease Mother    Asthma Sister    Mental illness Brother    Breast cancer Neg Hx     Social History   Occupational History   Not on file  Tobacco Use   Smoking status: Former    Packs/day: 1.00    Years: 45.00    Total pack years: 45.00    Types: Cigarettes    Quit date: 10/27/2011    Years since quitting: 10.9   Smokeless tobacco: Never  Substance and Sexual Activity   Alcohol use: No   Drug use: No   Sexual activity: Never     Physical Exam: Blood pressure 112/70, pulse 64, temperature 98.3 F (36.8 C), temperature source Oral, height '5\' 5"'$  (1.651 m), weight 185 lb (83.9 kg), SpO2 95 %.  Gen:      No acute distress Lungs:    CTAB no wheezes or crackles CV:         RRR no mrg   Data Reviewed: Imaging: I have personally reviewed the chest xray April 2018 - mild cardiomegaly  PFTs:      Latest Ref Rng & Units 04/17/2021    2:55 PM  PFT Results  FVC-Pre L 1.58   FVC-Predicted Pre % 68   FVC-Post  L 1.72   FVC-Predicted Post % 74   Pre FEV1/FVC % % 63   Post FEV1/FCV % % 66   FEV1-Pre L 0.99   FEV1-Predicted Pre % 55   FEV1-Post L 1.13    I have personally reviewed the patient's PFTs and moderately severe airflow limitation  Labs: IgE elevated 1,061 Sensitive to dust mites, cats, dogs, grass, cockroach, mouse  Immunization status: Immunization History  Administered Date(s) Administered   Hepatitis A, Adult 03/12/2016, 09/08/2016   Influenza Split 10/22/2011, 09/24/2012, 09/22/2013, 10/06/2013, 09/07/2014, 09/08/2015, 09/04/2017, 09/03/2018   Influenza, High Dose Seasonal PF 08/08/2021   PFIZER(Purple Top)SARS-COV-2 Vaccination 01/02/2020, 01/23/2020, 03/20/2021, 08/16/2021   Pneumococcal Conjugate-13 07/02/2015   Pneumococcal Polysaccharide-23 10/22/2011, 01/04/2019   Tdap 01/02/2016   Zoster, Live 01/02/2016, 10/21/2017, 12/22/2017     Assessment:  Asthma COPD Overlap Syndrome, not well controlled.   History of tobacco use disorder, in remission.  GERD controlled Allergic Rhinitis controlled   Plan/Recommendations: continue Advair, spiriva, albuterol for now. Since feno is not elevated I will not increase her ICS and she is already on high dose.  Will have her monitor peak flows daily for the next two weeks and obtain echocardiogram to rule out cardiac dysfunction.  Consider that maybe her COPD is worsening and that she needs repeat spiro vs adding nebulized LAMA to her therapies.  Consider pulmonary rehab.  Consider biologic therapy for asthma.  continue PPI once daily continue claritin, and singulair for rhinitis. She feels these symptoms are currently well controlled.    Return to Care: Return in about 3 weeks (around 10/09/2022).   Lenice Llamas, MD Pulmonary and [place]

## 2022-09-18 NOTE — Patient Instructions (Signed)
Please schedule follow up scheduled with myself in 2-3 weeks  If my schedule is not open yet, we will contact you with a reminder closer to that time. Please call [phone] if you haven't heard from [place] a month before.   Before your next visit I would like you to have:  Echocardiogram - ultrasound of your heart.  Please write down your peak flow meter readings daily  See the instructions I printed for your separately.  Write them down daily at the same time and take another reading if your breathing doesn't feel good.  I will review these readings with you at follow up.   Try taking albuterol nebulizer machine instead of inhaler to see if this helps.

## 2022-09-18 NOTE — Telephone Encounter (Signed)
Called and spoke with patient. She wanted to let Dr. [name] know that she went to her pharmacy and asked for a peak flow meter and they did not have it. They advised her to have an order sent to a local DME company.  I did advise her that since she had [employer], more than likely the DME would be [employer]. She is aware to receive a call from them once they have the order.   Dr. [name], please advise if you are ok with signing the flow meter. Thanks!

## 2022-09-19 DIAGNOSIS — M7061 Trochanteric bursitis, right hip: Secondary | ICD-10-CM | POA: Diagnosis not present

## 2022-09-22 ENCOUNTER — Telehealth: Payer: Self-pay | Admitting: Internal Medicine

## 2022-09-22 NOTE — Telephone Encounter (Signed)
Tried calling pt, no answer and line turned to busy tone

## 2022-09-23 NOTE — Telephone Encounter (Signed)
Patient returning call- would like to come in today to go over how to use peak flow meter.

## 2022-09-24 NOTE — Telephone Encounter (Signed)
Found this info on how to use a Peak Flow Meter:  How Do I Use a Peak Flow Meter? Place the indicator at the base of the numbered scale. Stand up. Take a deep breath. Place the meter in your mouth between your teeth and close your lips around the mouthpiece. Do not let your tongue block the hole in the mouthpiece. Blow out as hard and fast as you can in a single blow. Write down the number you get. But if you cough as you use the meter or make a mistake, don't use that number. Blow into the meter again and get a new reading. Immediately repeat steps 1 through 6 two more times. Write down the highest of the three numbers in your asthma diary.   Attempted to call pt but unable to reach. Left message for her to return call.

## 2022-09-25 ENCOUNTER — Ambulatory Visit (HOSPITAL_COMMUNITY)
Admission: RE | Admit: 2022-09-25 | Discharge: 2022-09-25 | Disposition: A | Discharge status: Home / Self Care | Payer: Medicare HMO | Source: Ambulatory Visit | Attending: Internal Medicine | Admitting: Internal Medicine

## 2022-09-25 ENCOUNTER — Telehealth: Payer: Self-pay | Admitting: *Deleted

## 2022-09-25 DIAGNOSIS — R0600 Dyspnea, unspecified: Secondary | ICD-10-CM | POA: Insufficient documentation

## 2022-09-25 DIAGNOSIS — K219 Gastro-esophageal reflux disease without esophagitis: Secondary | ICD-10-CM | POA: Diagnosis not present

## 2022-09-25 DIAGNOSIS — I129 Hypertensive chronic kidney disease with stage 1 through stage 4 chronic kidney disease, or unspecified chronic kidney disease: Secondary | ICD-10-CM | POA: Diagnosis not present

## 2022-09-25 DIAGNOSIS — R0609 Other forms of dyspnea: Secondary | ICD-10-CM | POA: Diagnosis not present

## 2022-09-25 DIAGNOSIS — N183 Chronic kidney disease, stage 3 unspecified: Secondary | ICD-10-CM | POA: Diagnosis not present

## 2022-09-25 DIAGNOSIS — J449 Chronic obstructive pulmonary disease, unspecified: Secondary | ICD-10-CM | POA: Diagnosis not present

## 2022-09-25 LAB — ECHOCARDIOGRAM COMPLETE
AR max vel: 3.21 cm2
AV Area VTI: 2.98 cm2
AV Area mean vel: 2.81 cm2
AV Mean grad: 5 mmHg
AV Peak grad: 10.2 mmHg
Ao pk vel: 1.6 m/s
Area-P 1/2: 2.83 cm2
Calc EF: 53.4 %
S' Lateral: 2.8 cm
Single Plane A2C EF: 60.2 %
Single Plane A4C EF: 46.4 %

## 2022-09-25 NOTE — Progress Notes (Signed)
  Echocardiogram 2D Echocardiogram has been performed.  Barbara [name] 09/25/2022, 2:26 PM

## 2022-09-25 NOTE — Telephone Encounter (Signed)
Katie from [facility] called just to [facility] that patients TAPS score was 17. States she is breathing fine, exertion when walking and has a cat that may trigger allergies.  Katie asked if she has an appointment. Informed her of her upcoming appointment on 10/25 with pulmonary office. Nothing further needed at this time

## 2022-09-30 ENCOUNTER — Ambulatory Visit: Payer: Medicare HMO | Admitting: Internal Medicine

## 2022-10-01 ENCOUNTER — Ambulatory Visit: Payer: Medicare HMO | Admitting: Internal Medicine

## 2022-10-01 ENCOUNTER — Encounter: Payer: Self-pay | Admitting: Internal Medicine

## 2022-10-01 ENCOUNTER — Telehealth: Payer: Self-pay | Admitting: Pharmacist

## 2022-10-01 VITALS — BP 130/78 | HR 64 | Temp 98.2°F | Ht 64.0 in | Wt 184.4 lb

## 2022-10-01 DIAGNOSIS — J4489 Other specified chronic obstructive pulmonary disease: Secondary | ICD-10-CM

## 2022-10-01 NOTE — Progress Notes (Signed)
Barbara [name]    [id number]    02/01/1951  Primary Care Physician:Sun, Gari Crown, MD Date of Appointment: 10/01/2022 Established Patient Visit  Chief complaint:   Chief Complaint  Patient presents with   Follow-up    SOB and wheezing x 2 weeks.  Sx improved today.     HPI: Barbara [name] is a 71 y.o. woman with Asthma COPD overlap syndrome.  Interval Updates: Here for follow up after echocardiogram which showed grade 1 diastolic dysfunction  Picked up a peak flow meter and mean flows are around 150 ml/s Still having worsening asthma symptoms responsive to albuterol.  Wonders if there is seasonal variation to her symptoms because she feels a little better the last couple days.    Current Regimen: advair 230 2 puffs BID, spiriva, prn albuterol Asthma Triggers: allergies, being outside, exertion, cleaning solutions, heat Exacerbations in the last year: non requiring prednisone History of hospitalization or intubation:none Allergy Testing: never had.  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   I have reviewed the patient's family social and past medical history and updated as appropriate.   Past Medical History:  Diagnosis Date   Arthritis    right knee, neck   Asthma    COPD (chronic obstructive pulmonary disease) (HCC)    Hypertension    Mouth trouble    infection from tooth that was pulled approx. mid Mar. 2013    Past Surgical History:  Procedure Laterality Date   ABDOMINAL HYSTERECTOMY     SHOULDER SURGERY  in age 55's   cyst removed from [place] socket of left shoulder   TONSILLECTOMY  age 34   TUBAL LIGATION      Family  History  Problem Relation Age of Onset   Dementia Mother    Kidney disease Mother    Asthma Sister    Mental illness Brother    Breast cancer Neg Hx     Social History   Occupational History   Not on file  Tobacco Use   Smoking status: Former    Packs/day: 1.00    Years: 45.00    Total pack years: 45.00    Types: Cigarettes    Quit date: 10/27/2011    Years since quitting: 10.9   Smokeless tobacco: Never  Substance and Sexual Activity   Alcohol use: No   Drug use: No   Sexual activity: Never     Physical Exam: Blood pressure 130/78, pulse 64, temperature 98.2 F (36.8 C), temperature source Oral, height '5\' 4"'$  (1.626 m), weight 184 lb 6.4 oz (83.6 kg), SpO2 98 %.  Gen:      NAD Lungs:    ctab no wheezes or crackles CV:         RRR no mrg   Data Reviewed: Imaging: I have personally reviewed the chest xray April 2018 - mild cardiomegaly  PFTs:      Latest Ref Rng & Units 04/17/2021    2:55 PM  PFT Results  FVC-Pre L 1.58   FVC-Predicted Pre % 68   FVC-Post L 1.72  FVC-Predicted Post % 74   Pre FEV1/FVC % % 63   Post FEV1/FCV % % 66   FEV1-Pre L 0.99   FEV1-Predicted Pre % 55   FEV1-Post L 1.13    I have personally reviewed the patient's PFTs and moderately severe airflow limitation  Labs: IgE elevated 1,061 Sensitive to dust mites, cats, dogs, grass, cockroach, mouse  Immunization status: Immunization History  Administered Date(s) Administered   Hepatitis A, Adult 03/12/2016, 09/08/2016   Influenza Split 10/22/2011, 09/24/2012, 09/22/2013, 10/06/2013, 09/07/2014, 09/08/2015, 09/04/2017, 09/03/2018   Influenza, High Dose Seasonal PF 08/08/2021   Influenza-Unspecified 09/19/2022   PFIZER(Purple Top)SARS-COV-2 Vaccination 01/02/2020, 01/23/2020, 03/20/2021, 08/16/2021   Pneumococcal Conjugate-13 07/02/2015   Pneumococcal Polysaccharide-23 10/22/2011, 01/04/2019   Tdap 01/02/2016   Zoster, Live 01/02/2016, 10/21/2017, 12/22/2017    Assessment:   Asthma COPD Overlap Syndrome, not well controlled with progression of symptoms.  History of tobacco use disorder, in remission.  GERD controlled Allergic Rhinitis controlled   Plan/Recommendations: continue Advair, spiriva, albuterol for now. Repeat feno not elevated spirometry today shows no airflow limitation. She has had moderate airflow limitation with BD response as recently as last year.  She less excited about pulmonary rehab due to ongoing bursitis.  Apprehensive about giving herself an injection but agreeable to trial of tespire. Will enroll today.  Continue PPI once daily Continue claritin, and singulair for rhinitis.  I spent 30 minutes in the care of this patient today including pre-charting, chart review, review of results, face-to-face care, coordination of care and communication with consultants etc.).   Return to Care: Return in about 2 months (around 12/01/2022). With APP to follow up on tespire initiation.  I will see her after that.    Lenice Llamas, MD Pulmonary and [place]

## 2022-10-01 NOTE — Telephone Encounter (Addendum)
Please start TEZSPIRE BIV.  Dose: '210mg'$  SQ every 4 weeks  Dx: Severe persistent asthma  Tezspire Together enrollment form placed in PAP pending info folder in pharmacy office.  Knox Saliva, PharmD, MPH, BCPS, CPP Clinical Pharmacist (Rheumatology and Pulmonology)   ----- Message from [name], MD sent at 10/01/2022  4:08 PM EDT ----- New start tespire please. Enrollment paperwork filled out today.

## 2022-10-01 NOTE — Progress Notes (Signed)
Will start Tezspire BIV. Thanks!  Knox Saliva, PharmD, MPH, BCPS, CPP Clinical Pharmacist (Rheumatology and Pulmonology)

## 2022-10-01 NOTE — Patient Instructions (Addendum)
Please schedule follow up scheduled with APP in 5 months.  You will see me after that in the spring.   continue Advair, spiriva, albuterol for now.  Continue claritin, and singulair for rhinitis. Continue PPI once daily We will start the enrollment process for tespire, the injectable medication for asthma today.

## 2022-10-03 ENCOUNTER — Other Ambulatory Visit (HOSPITAL_COMMUNITY): Payer: Self-pay

## 2022-10-03 NOTE — Telephone Encounter (Signed)
BIV started for new start of Tezspire.  PA pending through [facility] in [facility], awaiting determination  Key: [id number]

## 2022-10-06 ENCOUNTER — Other Ambulatory Visit (HOSPITAL_COMMUNITY): Payer: Self-pay

## 2022-10-06 ENCOUNTER — Telehealth: Payer: Self-pay

## 2022-10-06 DIAGNOSIS — J4489 Other specified chronic obstructive pulmonary disease: Secondary | ICD-10-CM

## 2022-10-06 NOTE — Telephone Encounter (Signed)
ATC patient to schedule Dupixent new start and discuss Tezspire denied. She requested call back tomorrow afternoon as she has a Chief of Staff arriving in a few minutes to her home. Will f/u tomorrow afternoon  Knox Saliva, PharmD, MPH, BCPS, CPP Clinical Pharmacist (Rheumatology and Pulmonology)

## 2022-10-06 NOTE — Telephone Encounter (Signed)
Insurance initially denied coverage for Tezspire pending Dupixent and Fasenra step therapy.  Submitted a Prior Authorization request to [facility] for [place] via CoverMyMeds. Authorization has been APPROVED from 12/08/2021 to 12/08/2023. Approval letter sent to scan center.  Authorization covers 20m loading dose. Test Claim revealed that a 28 day supply has a copay of $0.00.   Patient can fill through [facility] [facility] [place]: [id number] Authorization#: [id number]

## 2022-10-06 NOTE — Telephone Encounter (Signed)
Received a fax regarding Prior Authorization from [facility] for [name]. Authorization has been DENIED because pt as not had previous trial and failure of BOTH preferred medications (Dupixent and Fasenra).  Routing to Dr. [name] for advisement on which alternative medication to pursue.

## 2022-10-06 NOTE — Telephone Encounter (Signed)
Recommend starting dupixent then. Triage please call and have her come by to fill out enrollment paperwork and send it to pharmacy. thanks

## 2022-10-06 NOTE — Telephone Encounter (Signed)
Pharmacy team will begin [place] in separate encounter.

## 2022-10-07 ENCOUNTER — Other Ambulatory Visit (HOSPITAL_COMMUNITY): Payer: Self-pay

## 2022-10-07 MED ORDER — DUPIXENT 300 MG/2ML ~~LOC~~ SOAJ
SUBCUTANEOUS | 0 refills | Status: DC
  Filled 2022-10-07: qty 8, 28d supply, fill #0

## 2022-10-07 NOTE — Telephone Encounter (Signed)
Spoke with patient regarding Dupixent approval. Patient amenable to trial. Scheduled for Dupixent new start on 10/15/22. Rx sent to [facility] to be couriered to [facility] by that appt.  Routing to [place] for onboarding needs  Knox Saliva, PharmD, MPH, BCPS, CPP Clinical Pharmacist (Rheumatology and Pulmonology)

## 2022-10-08 ENCOUNTER — Other Ambulatory Visit (HOSPITAL_COMMUNITY): Payer: Self-pay

## 2022-10-08 NOTE — Telephone Encounter (Signed)
Delivery instructions have been updated in [place], medication will be couriered to [facility] by 10/13/2022.  Rx has been processed in [facility] and the patient has no copay at this time.

## 2022-10-10 ENCOUNTER — Other Ambulatory Visit (HOSPITAL_COMMUNITY): Payer: Self-pay

## 2022-10-13 ENCOUNTER — Other Ambulatory Visit (HOSPITAL_COMMUNITY): Payer: Self-pay

## 2022-10-14 ENCOUNTER — Other Ambulatory Visit (HOSPITAL_COMMUNITY): Payer: Self-pay

## 2022-10-14 NOTE — Progress Notes (Unsigned)
HPI Patient presents today to [facility] to see pharmacy team for [place] new start.  Past medical history includes HTN, asthma-COPD overlap, GERD, lupus erythematosus.  Respiratory Medications Current regimen: Advair 230-70mg/act 2 puffs BID, montelukast '10mg'$  once daily, Spiriva 2.55m 2 puffs daily Tried in past: Symbicort, Dulera Patient reports {Adherence challenges yes [id number]::"AGTXMIWOEhallenges","no known adherence challenges"}  OBJECTIVE Allergies  Allergen Reactions   Nsaids Shortness Of Breath   Clonidine Derivatives     Coughing shortness of breath   Codeine Other (See Comments)    Reaction: hallucination    Ibuprofen     Other reaction(s): "NSAIDS" took breath away   Lisinopril Hives   Penicillins Hives   Vicodin [Hydrocodone-Acetaminophen] Nausea And Vomiting   Procaine Rash    Outpatient Encounter Medications as of 10/15/2022  Medication Sig   acetaminophen (TYLENOL) 500 MG tablet Take 1,000 mg by mouth in the morning and at bedtime.   albuterol (PROVENTIL HFA;VENTOLIN HFA) 108 (90 BASE) MCG/ACT inhaler Inhale 2 puffs into the lungs every 6 (six) hours as needed. For shortness of breath   albuterol (PROVENTIL) (2.5 MG/3ML) 0.083% nebulizer solution Take 3 mLs (2.5 mg total) by nebulization every 6 (six) hours as needed. Breathing   amLODipine (NORVASC) 10 MG tablet Take 10 mg by mouth daily.   Calcium Carbonate-Vit D-Min (CALCIUM 1200 PO) Take 1 tablet by mouth daily.   Cholecalciferol (VITAMIN D3) 50 MCG (2000 UT) TABS Take by mouth.   Cod Liver Oil CAPS Take 1 capsule by mouth daily.   Cyanocobalamin (VITAMIN B 12 PO) Take 250 mcg by mouth daily.   Dupilumab (DUPIXENT) 300 MG/2ML SOPN Inject '600mg'$  into the skin at Week 0 ,then '300mg'$  into the skin every 14 days thereafter. Courier to pulm: [address], [place] [id number] on 10/15/22   fluticasone-salmeterol (ADVAIR HFA) 230-21 MCG/ACT inhaler INHALE 2 PUFFS TWICE DAILY    hydrochlorothiazide (HYDRODIURIL) 25 MG tablet Take 25 mg by mouth daily.   loratadine (CLARITIN) 10 MG tablet Take 10 mg by mouth daily.   montelukast (SINGULAIR) 10 MG tablet TAKE 1 TABLET (10 MG TOTAL) BY MOUTH AT BEDTIME.   montelukast (SINGULAIR) 10 MG tablet 1 tablet   Omega-3 Fatty Acids (FISH OIL) 1200 MG CAPS Take 1 capsule by mouth daily.   pantoprazole (PROTONIX) 40 MG tablet 1 tablet   pantoprazole (PROTONIX) 40 MG tablet TAKE 1 TABLET (40 MG TOTAL) BY MOUTH DAILY.   Peak Flow Meter DEVI Dispense peak flow meter for asthma   prednisoLONE acetate (PRED FORTE) 1 % ophthalmic suspension SMARTSIG:In Eye(s)   Pseudoephedrine-Guaifenesin [phone] MG TB12 Take 1,200 mg by mouth daily.   RESTASIS 0.05 % ophthalmic emulsion 1 drop 2 (two) times daily.   SPIRIVA RESPIMAT 2.5 MCG/ACT AERS    Tiotropium Bromide Monohydrate (SPIRIVA RESPIMAT) 2.5 MCG/ACT AERS Inhale 2 each into the lungs daily.   traMADol (ULTRAM) 50 MG tablet Take 50 mg by mouth every 12 (twelve) hours as needed for moderate pain.   TURMERIC CURCUMIN PO Take 1 capsule by mouth daily.   No facility-administered encounter medications on file as of 10/15/2022.     Immunization History  Administered Date(s) Administered   Hepatitis A, Adult 03/12/2016, 09/08/2016   Influenza Split 10/22/2011, 09/24/2012, 09/22/2013, 10/06/2013, 09/07/2014, 09/08/2015, 09/04/2017, 09/03/2018   Influenza, High Dose Seasonal PF 08/08/2021   Influenza-Unspecified 09/19/2022   PFIZER(Purple Top)SARS-COV-2 Vaccination 01/02/2020, 01/23/2020, 03/20/2021, 08/16/2021   Pneumococcal Conjugate-13 07/02/2015   Pneumococcal Polysaccharide-23 10/22/2011, 01/04/2019  Tdap 01/02/2016   Zoster, Live 01/02/2016, 10/21/2017, 12/22/2017     PFTs    Latest Ref Rng & Units 04/17/2021    2:55 PM  PFT Results  FVC-Pre L 1.58   FVC-Predicted Pre % 68   FVC-Post L 1.72   FVC-Predicted Post % 74   Pre FEV1/FVC % % 63   Post FEV1/FCV % % 66   FEV1-Pre L  0.99   FEV1-Predicted Pre % 55   FEV1-Post L 1.13      Eosinophils Most recent blood eosinophil count was 390 cells/microL taken on 04/17/2021.   IgE: 1061 on 04/17/2021   Assessment   Biologics training for dupilumab (Dupixent)  Goals of therapy: Mechanism: human monoclonal IgG4 antibody that inhibits interleukin-4 and interleukin-13 cytokine-induced responses, including release of proinflammatory cytokines, chemokines, and IgE Reviewed that Dupixent is add-on medication and patient must continue maintenance inhaler regimen. Response to therapy: may take 4 months to determine efficacy. Discussed that patients generally feel improvement sooner than 4 months.  Side effects: injection site reaction (6-18%), antibody development (5-16%), ophthalmic conjunctivitis (2-16%), transient blood eosinophilia (1-2%)  Dose: '600mg'$  at Week 0 (administered today in clinic) followed by '300mg'$  every 14 days thereafter  Administration/Storage:  Reviewed administration sites of thigh or abdomen (at least 2-3 inches away from abdomen). Reviewed the upper arm is only appropriate if caregiver is administering injection  Do not shake pen/syringe as this could lead to product foaming or precipitation. Do not use if solution is discolored or contains particulate matter or if window on prefilled pen is yellow (indicates pen has been used).  Reviewed storage of medication in refrigerator. Reviewed that [place] can be stored at room temperature in unopened carton for up to 14 days.  Access: Approval of Dupixent through: insurance Patient enrolled into copay card program to help with copay assistance.  Patient self-administered Dupixent '300mg'$ /33m x 2 (total dose '600mg'$ ) in {injsitedsg:28167} and {injsitedsg:28167} using sample Dupixent '300mg'$ /269mautoinjector pen NDC: *** Lot: *** Expiration: ***  Patient monitored for 30 minutes for adverse reaction.  Patient tolerated ***. Injection site checked and no  ***.  Medication Reconciliation  A drug regimen assessment was performed, including review of allergies, interactions, disease-state management, dosing and immunization history. Medications were reviewed with the patient, including name, instructions, indication, goals of therapy, potential side effects, importance of adherence, and safe use.  Drug interaction(s): none  Immunizations  Patient is indicated for the influenzae, pneumonia, and shingles vaccinations. Patient has received 4 COVID19 vaccines.   PLAN Continue Dupixent '300mg'$  every 14 days.  Next dose is due 11/22 and every 14 days thereafter. [facility]: [phone]  Patient provided with pharmacy phone number and advised to call later this week to schedule shipment to home. Patient provided with copay card information to provide to pharmacy if quoted copay exceeds $5 per month. Continue maintenance asthma regimen of: Advair 230-2194mact 2 puffs BID, montelukast '10mg'$  once daily, Spiriva 2.5mc51m puffs daily  All questions encouraged and answered.  Instructed patient to reach out with any further questions or concerns.  Thank you for allowing pharmacy to participate in this patient's care.  This appointment required *** minutes of patient care (this includes precharting, chart review, review of results, face-to-face care, etc.).   AllyMaryan PulsarmD PGY-1 [facility] Resident

## 2022-10-15 ENCOUNTER — Ambulatory Visit: Payer: Medicare HMO | Admitting: Pharmacist

## 2022-10-15 ENCOUNTER — Other Ambulatory Visit (HOSPITAL_COMMUNITY): Payer: Self-pay

## 2022-10-15 DIAGNOSIS — J4489 Other specified chronic obstructive pulmonary disease: Secondary | ICD-10-CM

## 2022-10-15 MED ORDER — DUPIXENT 300 MG/2ML ~~LOC~~ SOAJ
300.0000 mg | SUBCUTANEOUS | 4 refills | Status: DC
  Filled 2022-10-15: qty 12, 84d supply, fill #0
  Filled 2022-11-07 – 2022-11-19 (×2): qty 4, 28d supply, fill #0
  Filled 2022-12-09: qty 8, 28d supply, fill #0

## 2022-10-15 MED ORDER — SPACER/AERO-HOLDING CHAMBERS DEVI: 1 refills | Status: DC

## 2022-10-15 NOTE — Patient Instructions (Signed)
Your next Dupixent dose is due on 11/22, 12/6, and every 14 days thereafter  CONTINUE Advair 230-31mg/act 2 puffs twice daily, montelukast '10mg'$  once daily, Spiriva 2.548m 2 puffs daily  [facility] number [facility] [phone] call to schedule shipment and confirm address. They will mail your medication to your home.  You will need to be seen by your provider in 3 to 4 months to assess how Dupixent is working for you. Please ensure you have a follow-up appointment scheduled in February or March. Call our clinic if you need to make this appointment.  How to manage an injection site reaction: Remember the 5 C's: COUNTER - leave on the counter at least 30 minutes but up to overnight to bring medication to room temperature. This may help prevent stinging COLD - place something cold (like an ice gel pack or cold water bottle) on the injection site just before cleansing with alcohol. This may help reduce pain CLARITIN - use Claritin (generic name is loratadine) for the first two weeks of treatment or the day of, the day before, and the day after injecting. This will help to minimize injection site reactions CORTISONE CREAM - apply if injection site is irritated and itching CALL ME - if injection site reaction is bigger than the size of your fist, looks infected, blisters, or if you develop hives

## 2022-10-17 NOTE — Telephone Encounter (Signed)
Patient was seen by Dr. [name] on 10/25. Will close this encounter.

## 2022-11-07 ENCOUNTER — Other Ambulatory Visit (HOSPITAL_COMMUNITY): Payer: Self-pay

## 2022-11-18 DIAGNOSIS — H04123 Dry eye syndrome of bilateral lacrimal glands: Secondary | ICD-10-CM | POA: Diagnosis not present

## 2022-11-18 DIAGNOSIS — H40013 Open angle with borderline findings, low risk, bilateral: Secondary | ICD-10-CM | POA: Diagnosis not present

## 2022-11-18 DIAGNOSIS — H35033 Hypertensive retinopathy, bilateral: Secondary | ICD-10-CM | POA: Diagnosis not present

## 2022-11-18 DIAGNOSIS — H35013 Changes in retinal vascular appearance, bilateral: Secondary | ICD-10-CM | POA: Diagnosis not present

## 2022-11-19 ENCOUNTER — Other Ambulatory Visit (HOSPITAL_COMMUNITY): Payer: Self-pay

## 2022-11-20 ENCOUNTER — Other Ambulatory Visit (HOSPITAL_COMMUNITY): Payer: Self-pay

## 2022-11-20 ENCOUNTER — Telehealth: Payer: Self-pay | Admitting: Internal Medicine

## 2022-11-20 ENCOUNTER — Other Ambulatory Visit: Payer: Self-pay

## 2022-11-20 NOTE — Telephone Encounter (Signed)
Hey I was unsure if we even offer samples of Dupixent or not. I was not sure about this  Can someone call and update this patient if we offer samples  Thank you

## 2022-11-20 NOTE — Telephone Encounter (Signed)
Patient called to ask if she could get some samples of Dupixent.  She stated that the cost is too expensive and she does not have any more and cannot afford to pay.  Please call patient asap to let her know if she can get some samples from the office.  CB# [phone]

## 2022-11-21 ENCOUNTER — Other Ambulatory Visit: Payer: Self-pay

## 2022-11-24 ENCOUNTER — Other Ambulatory Visit (HOSPITAL_COMMUNITY): Payer: Self-pay

## 2022-11-24 NOTE — Telephone Encounter (Signed)
Pt stopped by clinic and explained the situation to me, apparently she has "hit her limit" with [facility] and was being quoted a copay of almost $300 for her refill.   Sample provided to pt, her refill is scheduled to be re-processed in January at [facility]. Informed her that if the copay is still too expensive at that time then we will begin the process of PAP enrollment. Pt verbalized understanding.

## 2022-12-02 ENCOUNTER — Ambulatory Visit: Payer: Medicare HMO | Admitting: Adult Health

## 2022-12-02 ENCOUNTER — Encounter: Payer: Self-pay | Admitting: Adult Health

## 2022-12-02 ENCOUNTER — Telehealth: Payer: Self-pay | Admitting: *Deleted

## 2022-12-02 VITALS — BP 104/60 | HR 81 | Temp 98.4°F | Ht 65.0 in | Wt 188.4 lb

## 2022-12-02 DIAGNOSIS — J449 Chronic obstructive pulmonary disease, unspecified: Secondary | ICD-10-CM | POA: Diagnosis not present

## 2022-12-02 DIAGNOSIS — J4540 Moderate persistent asthma, uncomplicated: Secondary | ICD-10-CM | POA: Diagnosis not present

## 2022-12-02 DIAGNOSIS — J45909 Unspecified asthma, uncomplicated: Secondary | ICD-10-CM | POA: Insufficient documentation

## 2022-12-02 NOTE — Assessment & Plan Note (Signed)
Continue on Advair and Spiriva.  Plan  Patient Instructions  Continue on Advair twice daily Continue on Spiriva daily Continue on Singulair daily Continue Claritin daily as needed Continue on Dupixent injections for now.  Will contact pharmacy for cost issues with Dupixent Albuterol inhaler as needed Follow-up with Dr. [name] in 3 months and As needed

## 2022-12-02 NOTE — Telephone Encounter (Signed)
Patient states that the dupixent has caused her to be in the coverage gap (dounut hole).  Please invistigate if this is going to be an ongoing problem.  Thank you.

## 2022-12-02 NOTE — Telephone Encounter (Signed)
The patient's copay is $200 and she cannot afford this, what other options are there for this patient.  Is there patient assistance available?  Thank you.

## 2022-12-02 NOTE — Telephone Encounter (Signed)
The patients insurance plan that they have selected is responsible for the amount of coverage and how quickly the patient reaches their coverage gap.

## 2022-12-02 NOTE — Progress Notes (Signed)
$'@Patient'd$  ID: Barbara [name], female    DOB: 12-Jul-1951, 71 y.o.   MRN: [id number]  Chief Complaint  Patient presents with   Follow-up    Referring provider: Donald Prose, MD  HPI: 71 year old female followed for asthma/COPD overlap syndrome with allergic phenotype (elevated IgE) Medical history significant for diastolic dysfunction  TEST/EVENTS :  Current Regimen: advair 230 2 puffs BID, spiriva, prn albuterol Asthma Triggers: allergies, being outside, exertion, cleaning solutions, heat Exacerbations in the last year: non requiring prednisone History of hospitalization or intubation:none Allergy Testing: never had.  GERD: yes on PPI once daily Allergic Rhinitis: on cetirizine, singulair  IgE elevated 1,061 Sensitive to dust mites, cats, dogs, grass, cockroach, mouse  12/02/2022 Follow up : Asthma /COPD overlap Patient presents for a 29-monthfollow-up.  Patient has underlying asthma with COPD overlap.  Also allergic phenotype.  She is maintained on Advair, Spiriva, Singulair and Zyrtec.  Last visit she was started on Dupixent.  Patient says that she is been doing home injections without any known difficulties.  Is concerned because the last prescription was not covered well by her insurance and she was unable to get because it was a $200 co-pay.  Patient says she lives on a fixed income and cannot afford this.  She also says that it has put her into the donut hole which will affect her other medications.. Patient says that her cough and wheezing has improved.  She denies any increased albuterol usage.   Allergies  Allergen Reactions   Nsaids Shortness Of Breath   Clonidine Derivatives     Coughing shortness of breath   Codeine Other (See Comments)    Reaction: hallucination    Ibuprofen     Other reaction(s): "NSAIDS" took breath away   Lisinopril Hives   Penicillins Hives   Vicodin [Hydrocodone-Acetaminophen] Nausea And Vomiting   Procaine Rash    Immunization History   Administered Date(s) Administered   Hepatitis A, Adult 03/12/2016, 09/08/2016   Influenza Split 10/22/2011, 09/24/2012, 09/22/2013, 10/06/2013, 09/07/2014, 09/08/2015, 09/04/2017, 09/03/2018   Influenza, High Dose Seasonal PF 08/08/2021   Influenza-Unspecified 09/19/2022   PFIZER(Purple Top)SARS-COV-2 Vaccination 01/02/2020, 01/23/2020, 03/20/2021, 08/16/2021   Pneumococcal Conjugate-13 07/02/2015   Pneumococcal Polysaccharide-23 10/22/2011, 01/04/2019   Tdap 01/02/2016   Zoster, Live 01/02/2016, 10/21/2017, 12/22/2017    Past Medical History:  Diagnosis Date   Arthritis    right knee, neck   Asthma    [place]    Hypertension    Mouth trouble    infection from tooth that was pulled approx. mid Mar. 2013    Tobacco History: Social History   Tobacco Use  Smoking Status Former   Packs/day: 1.00   Years: 45.00   Total pack years: 45.00   Types: Cigarettes   Quit date: 10/27/2011   Years since quitting: 11.1  Smokeless Tobacco Never   Counseling given: Not Answered   Outpatient Medications Prior to Visit  Medication Sig Dispense Refill   acetaminophen (TYLENOL) 500 MG tablet Take 1,000 mg by mouth in the morning and at bedtime.     albuterol (PROVENTIL HFA;VENTOLIN HFA) 108 (90 BASE) MCG/ACT inhaler Inhale 2 puffs into the lungs every 6 (six) hours as needed. For shortness of breath     albuterol (PROVENTIL) (2.5 MG/3ML) 0.083% nebulizer solution Take 3 mLs (2.5 mg total) by nebulization every 6 (six) hours as needed. Breathing 75 mL 3   amLODipine (NORVASC) 10 MG tablet Take 10 mg by mouth daily.  Calcium Carbonate-Vit D-Min (CALCIUM 1200 PO) Take 1 tablet by mouth daily.     Cholecalciferol (VITAMIN D3) 50 MCG (2000 UT) TABS Take by mouth.     Cod Liver Oil CAPS Take 1 capsule by mouth daily.     Cyanocobalamin (VITAMIN B 12 PO) Take 250 mcg by mouth daily.     Dupilumab (DUPIXENT) 300 MG/2ML SOPN Inject 300 mg into the  skin every 14 (fourteen) days. 12 mL 4   fluticasone-salmeterol (ADVAIR HFA) 230-21 MCG/ACT inhaler INHALE 2 PUFFS TWICE DAILY 36 g 3   hydrochlorothiazide (HYDRODIURIL) 25 MG tablet Take 25 mg by mouth daily.     loratadine (CLARITIN) 10 MG tablet Take 10 mg by mouth daily.     montelukast (SINGULAIR) 10 MG tablet TAKE 1 TABLET (10 MG TOTAL) BY MOUTH AT BEDTIME. 90 tablet 3   montelukast (SINGULAIR) 10 MG tablet 1 tablet     Omega-3 Fatty Acids (FISH OIL) 1200 MG CAPS Take 1 capsule by mouth daily.     pantoprazole (PROTONIX) 40 MG tablet 1 tablet     pantoprazole (PROTONIX) 40 MG tablet TAKE 1 TABLET (40 MG TOTAL) BY MOUTH DAILY. 90 tablet 1   Peak Flow Meter DEVI Dispense peak flow meter for asthma 1 each 0   RESTASIS 0.05 % ophthalmic emulsion 1 drop 2 (two) times daily.     Spacer/Aero-Holding Dorise Bullion Use with inhalers as directed. 1 each 1   SPIRIVA RESPIMAT 2.5 MCG/ACT AERS      Tiotropium Bromide Monohydrate (SPIRIVA RESPIMAT) 2.5 MCG/ACT AERS Inhale 2 each into the lungs daily.     traMADol (ULTRAM) 50 MG tablet Take 50 mg by mouth every 12 (twelve) hours as needed for moderate pain.     TURMERIC CURCUMIN PO Take 1 capsule by mouth daily.     No facility-administered medications prior to visit.     Review of Systems:   Constitutional:   No  weight loss, night sweats,  Fevers, chills, fatigue, or  lassitude.  HEENT:   No headaches,  Difficulty swallowing,  Tooth/dental problems, or  Sore throat,                No sneezing, itching, ear ache   CV:  No chest pain,  Orthopnea, PND, swelling in lower extremities, anasarca, dizziness, palpitations, syncope.   GI  No heartburn, indigestion, abdominal pain, nausea, vomiting, diarrhea, change in bowel habits, loss of appetite, bloody stools.   Resp:  No chest wall deformity  Skin: no rash or lesions.  GU: no dysuria, change in color of urine, no urgency or frequency.  No flank pain, no hematuria   MS:  No joint pain or  swelling.  No decreased range of motion.  No back pain.    Physical Exam  BP 104/60 (BP Location: Left Arm, Patient Position: Sitting, Cuff Size: Large)   Pulse 81   Temp 98.4 F (36.9 C) (Oral)   Ht '5\' 5"'$  (1.651 m)   Wt 188 lb 6.4 oz (85.5 kg)   SpO2 94%   BMI 31.35 kg/m   GEN: A/Ox3; pleasant , NAD, well nourished    HEENT:  [place]/AT,  EACs-clear, TMs-wnl, NOSE-clear, THROAT-clear, no lesions, no postnasal drip or exudate noted.   NECK:  Supple w/ fair ROM; no JVD; normal carotid impulses w/o bruits; no thyromegaly or nodules palpated; no lymphadenopathy.    RESP  Clear  P & A; w/o, wheezes/ rales/ or rhonchi. no accessory muscle use, no dullness to  percussion  CARD:  RRR, no m/r/g, no peripheral edema, pulses intact, no cyanosis or clubbing.  GI:   Soft & nt; nml bowel sounds; no organomegaly or masses detected.   Musco: Warm bil, no deformities or joint swelling noted.   Neuro: alert, no focal deficits noted.    Skin: Warm, no lesions or rashes    Lab Results:    BMET   BNP No results found for: "BNP"  ProBNP   Imaging: No results found.       Latest Ref Rng & Units 04/17/2021    2:55 PM  PFT Results  FVC-Pre L 1.58   FVC-Predicted Pre % 68   FVC-Post L 1.72   FVC-Predicted Post % 74   Pre FEV1/FVC % % 63   Post FEV1/FCV % % 66   FEV1-Pre L 0.99   FEV1-Predicted Pre % 55   FEV1-Post L 1.13     No results found for: "NITRICOXIDE"      Assessment & Plan:   Asthma Moderate persistent asthma with COPD overlap.  Allergic phenotype with elevated IgE.  Patient is doing well on her aggressive maintenance regimen with Advair, Spiriva, singular, Zyrtec and recently added Dupixent.  Unfortunately cost may become a issue.  We have sent a message out to the pharmacy team to help with affordability of her Dupixent injections.   Asthma action plan discussed in detail.  Plan  Patient Instructions  Continue on Advair twice daily Continue on Spiriva  daily Continue on Singulair daily Continue Claritin daily as needed Continue on Dupixent injections for now.  Will contact pharmacy for cost issues with Dupixent Albuterol inhaler as needed Follow-up with Dr. [name] in 3 months and As needed        COPD GOLD II Continue on Advair and Spiriva.  Plan  Patient Instructions  Continue on Advair twice daily Continue on Spiriva daily Continue on Singulair daily Continue Claritin daily as needed Continue on Dupixent injections for now.  Will contact pharmacy for cost issues with Dupixent Albuterol inhaler as needed Follow-up with Dr. [name] in 3 months and As needed          Rexene Edison, NP 12/02/2022

## 2022-12-02 NOTE — Patient Instructions (Signed)
Continue on Advair twice daily Continue on Spiriva daily Continue on Singulair daily Continue Claritin daily as needed Continue on Dupixent injections for now.  Will contact pharmacy for cost issues with Dupixent Albuterol inhaler as needed Follow-up with Dr. [name] in 3 months and As needed

## 2022-12-02 NOTE — Assessment & Plan Note (Addendum)
Moderate persistent asthma with COPD overlap.  Allergic phenotype with elevated IgE.  Patient is doing well on her aggressive maintenance regimen with Advair, Spiriva, singular, Zyrtec and recently added Dupixent.  Unfortunately cost may become a issue.  We have sent a message out to the pharmacy team to help with affordability of her Dupixent injections.   Asthma action plan discussed in detail.  Plan  Patient Instructions  Continue on Advair twice daily Continue on Spiriva daily Continue on Singulair daily Continue Claritin daily as needed Continue on Dupixent injections for now.  Will contact pharmacy for cost issues with Dupixent Albuterol inhaler as needed Follow-up with Dr. [name] in 3 months and As needed

## 2022-12-04 NOTE — Telephone Encounter (Addendum)
As previously stated in 11/20/22 encounter, pt was provided with a 67-monthsupply of Dupixent on 11/24/22. [facility] amounts have been reset in early 12/2022 is already in place. Per recent correspondence from Medicare, the period of "coverage gap" will no longer be a factor in most (if not all) Part D plans until further notice.  Nothing further is needed at this time.

## 2022-12-06 DIAGNOSIS — R059 Cough, unspecified: Secondary | ICD-10-CM | POA: Diagnosis not present

## 2022-12-06 DIAGNOSIS — R0981 Nasal congestion: Secondary | ICD-10-CM | POA: Diagnosis not present

## 2022-12-06 DIAGNOSIS — J029 Acute pharyngitis, unspecified: Secondary | ICD-10-CM | POA: Diagnosis not present

## 2022-12-06 DIAGNOSIS — Z03818 Encounter for observation for suspected exposure to other biological agents ruled out: Secondary | ICD-10-CM | POA: Diagnosis not present

## 2022-12-06 DIAGNOSIS — R6883 Chills (without fever): Secondary | ICD-10-CM | POA: Diagnosis not present

## 2022-12-06 DIAGNOSIS — Z8709 Personal history of other diseases of the respiratory system: Secondary | ICD-10-CM | POA: Diagnosis not present

## 2022-12-09 ENCOUNTER — Other Ambulatory Visit (HOSPITAL_COMMUNITY): Payer: Self-pay

## 2022-12-09 ENCOUNTER — Other Ambulatory Visit: Payer: Self-pay

## 2022-12-10 ENCOUNTER — Telehealth: Payer: Self-pay | Admitting: Internal Medicine

## 2022-12-10 DIAGNOSIS — J4489 Other specified chronic obstructive pulmonary disease: Secondary | ICD-10-CM

## 2022-12-10 DIAGNOSIS — J029 Acute pharyngitis, unspecified: Secondary | ICD-10-CM | POA: Diagnosis not present

## 2022-12-10 NOTE — Telephone Encounter (Signed)
Returned call to pt, provided her with the results of the benefits investigation and informed her that her best option would be enrollment into PAP. Advised that I will be working on getting everything squared away and will draft up the paperwork, she will return to clinic on Friday to sign application.

## 2022-12-12 ENCOUNTER — Other Ambulatory Visit (HOSPITAL_COMMUNITY): Payer: Self-pay

## 2022-12-12 NOTE — Telephone Encounter (Signed)
Pt came to clinic and signed her portion for PAP. Provided her with 1 additional sample pen. Application placed in "awaiting response" folder until the MD portion has been returned.

## 2022-12-12 NOTE — Telephone Encounter (Signed)
Provider form placed in Dr. Mauricio Po box for signature.

## 2022-12-23 NOTE — Telephone Encounter (Signed)
Submitted Patient Assistance Application to [employer] for [place] along with provider portion, patient portion, med list, PA. Will update patient when we receive a response.  Fax# [phone] Phone# [phone], option 1  Knox Saliva, PharmD, MPH, BCPS, CPP Clinical Pharmacist (Rheumatology and Pulmonology)

## 2022-12-30 MED ORDER — DUPIXENT 300 MG/2ML ~~LOC~~ SOAJ: 300.0000 mg | SUBCUTANEOUS | 1 refills | Status: DC

## 2022-12-30 NOTE — Telephone Encounter (Signed)
Prescription for Dupixent has been sent to [place] electronically today  Knox Saliva, PharmD, MPH, BCPS, CPP Clinical Pharmacist (Rheumatology and Pulmonology)

## 2022-12-30 NOTE — Telephone Encounter (Signed)
Pt reached out to clinic looking for status update. Explained to her that I had just received the update from our [facility] [place] last night stating that her application had been processed and that she would need to call and express financial hardship. Phone number to [employer] provided and pt verbalized understanding to all.   Per pt, her next injection will be due on Wednesday 1/31--I reassured her that we would be able to provide her with another sample pen if need be.   [employer] also requires a separate prescription be sent in per Jason's email, in accordance with their new policies. Will route to [facility] for f/u on that.

## 2023-01-01 DIAGNOSIS — N183 Chronic kidney disease, stage 3 unspecified: Secondary | ICD-10-CM | POA: Diagnosis not present

## 2023-01-01 DIAGNOSIS — I129 Hypertensive chronic kidney disease with stage 1 through stage 4 chronic kidney disease, or unspecified chronic kidney disease: Secondary | ICD-10-CM | POA: Diagnosis not present

## 2023-01-01 DIAGNOSIS — K219 Gastro-esophageal reflux disease without esophagitis: Secondary | ICD-10-CM | POA: Diagnosis not present

## 2023-01-01 DIAGNOSIS — J449 Chronic obstructive pulmonary disease, unspecified: Secondary | ICD-10-CM | POA: Diagnosis not present

## 2023-01-01 NOTE — Telephone Encounter (Signed)
Received fax from [employer] stating enrollment form has been receiving and they are reviewing  Knox Saliva, PharmD, MPH, BCPS, CPP Clinical Pharmacist (Rheumatology and Pulmonology)

## 2023-01-02 ENCOUNTER — Telehealth: Payer: Self-pay

## 2023-01-02 ENCOUNTER — Other Ambulatory Visit (HOSPITAL_COMMUNITY): Payer: Self-pay

## 2023-01-02 DIAGNOSIS — J4489 Other specified chronic obstructive pulmonary disease: Secondary | ICD-10-CM

## 2023-01-02 MED ORDER — FLUTICASONE-SALMETEROL 500-50 MCG/ACT IN AEPB: 1.0000 | INHALATION_SPRAY | Freq: Two times a day (BID) | RESPIRATORY_TRACT | 5 refills | Status: DC

## 2023-01-02 NOTE — Telephone Encounter (Signed)
Per Melody Haver, he states that [employer] has realized that their notes were incorrect and that they do in fact have everything they need to proceed. The pt should be hearing from them within the next few days.

## 2023-01-02 NOTE — Telephone Encounter (Signed)
1 month sample provided to pt today in clinic. Spoke with our [facility] and explained situation, he will investigate further into pt's specific case.

## 2023-01-02 NOTE — Telephone Encounter (Signed)
Pt called stating that she cannot afford her Advair HFA inhaler, which has a copay of $150. I ran test claims for alternatives and learned that the generic Advair Diskus is only $45 through insurance. Explained to pt that, despite being the same drug, the formulation and mechanism of action of the Diskus is going to be different that the [facility]. Pt verbalized understanding and is agreeable to making the switch to the generic Advair Diskus.  Routing for f/u, pt is usually seen by Lenice Llamas, however she was most recently been seen by Rexene Edison on 12/02/22 during Dr. Mauricio Po LOA. Please send in new Rx to [facility].  Phone #: [phone] Fax #: [phone]

## 2023-01-02 NOTE — Telephone Encounter (Signed)
Rx sent earlier today.

## 2023-01-02 NOTE — Telephone Encounter (Signed)
Rx for Advair Diskus (generic) 500-50 mcg - 1puff twice daily sent to [facility]  Knox Saliva, PharmD, MPH, BCPS, CPP Clinical Pharmacist (Rheumatology and Pulmonology)

## 2023-01-15 ENCOUNTER — Other Ambulatory Visit (HOSPITAL_COMMUNITY): Payer: Self-pay

## 2023-01-22 NOTE — Telephone Encounter (Signed)
Received fax from [employer] that patient must apply for Medicare LIS (Social Security "Extra Help"). I spoke with patient - she states she applied by back in January 2024 and did not receive confirmation of approval. She assumes she was denied but has been waiting for the denial letter this whole month. I advised her to drop it off at clinic and we can faxed to [employer] which will be faster than sending via mail. She verbalized understanding.  Knox Saliva, PharmD, MPH, BCPS, CPP Clinical Pharmacist (Rheumatology and Pulmonology)

## 2023-01-23 ENCOUNTER — Telehealth: Payer: Self-pay | Admitting: Pharmacist

## 2023-01-26 NOTE — Telephone Encounter (Signed)
Pt appears to have dropped off letter from the [employer] stating that she may not be eligible for LIS, however she may still be eligible for Medicare and will need to enroll if she is denied LIS.  Will continue awaiting f/u.

## 2023-01-27 DIAGNOSIS — Z0000 Encounter for general adult medical examination without abnormal findings: Secondary | ICD-10-CM | POA: Diagnosis not present

## 2023-01-27 DIAGNOSIS — M8588 Other specified disorders of bone density and structure, other site: Secondary | ICD-10-CM | POA: Diagnosis not present

## 2023-01-27 DIAGNOSIS — L930 Discoid lupus erythematosus: Secondary | ICD-10-CM | POA: Diagnosis not present

## 2023-01-27 DIAGNOSIS — N1831 Chronic kidney disease, stage 3a: Secondary | ICD-10-CM | POA: Diagnosis not present

## 2023-01-27 DIAGNOSIS — I129 Hypertensive chronic kidney disease with stage 1 through stage 4 chronic kidney disease, or unspecified chronic kidney disease: Secondary | ICD-10-CM | POA: Diagnosis not present

## 2023-01-27 DIAGNOSIS — J4520 Mild intermittent asthma, uncomplicated: Secondary | ICD-10-CM | POA: Diagnosis not present

## 2023-01-27 DIAGNOSIS — J449 Chronic obstructive pulmonary disease, unspecified: Secondary | ICD-10-CM | POA: Diagnosis not present

## 2023-01-27 DIAGNOSIS — K219 Gastro-esophageal reflux disease without esophagitis: Secondary | ICD-10-CM | POA: Diagnosis not present

## 2023-01-27 DIAGNOSIS — Z1331 Encounter for screening for depression: Secondary | ICD-10-CM | POA: Diagnosis not present

## 2023-01-28 ENCOUNTER — Other Ambulatory Visit: Payer: Self-pay | Admitting: Family Medicine

## 2023-01-28 DIAGNOSIS — M8580 Other specified disorders of bone density and structure, unspecified site: Secondary | ICD-10-CM

## 2023-01-28 DIAGNOSIS — Z1231 Encounter for screening mammogram for malignant neoplasm of breast: Secondary | ICD-10-CM

## 2023-01-29 NOTE — Telephone Encounter (Signed)
Letter received. F/u will occur in previous encounter  Knox Saliva, PharmD, MPH, BCPS, CPP Clinical Pharmacist (Rheumatology and Pulmonology)

## 2023-02-09 NOTE — Telephone Encounter (Signed)
Patient has been advised by Arvilla Market that [employer] requires official determination letter from LIS to move forward with her PAP application determination (she is supposed to receive this within 2-3 weeks after initial letter). She plans ot f/u with [facility] department.  Knox Saliva, PharmD, MPH, BCPS, CPP Clinical Pharmacist (Rheumatology and Pulmonology)

## 2023-02-11 ENCOUNTER — Other Ambulatory Visit: Payer: Self-pay

## 2023-02-11 DIAGNOSIS — Z122 Encounter for screening for malignant neoplasm of respiratory organs: Secondary | ICD-10-CM

## 2023-02-11 DIAGNOSIS — Z87891 Personal history of nicotine dependence: Secondary | ICD-10-CM

## 2023-02-16 NOTE — Telephone Encounter (Signed)
Pt came to clinic and dropped off LIS denial letter this morning, it has been faxed to [employer] so that her application can move forward. Will need to reach back out to them later this afternoon to verify they have received the documentation.

## 2023-02-18 ENCOUNTER — Other Ambulatory Visit: Payer: Self-pay | Admitting: Internal Medicine

## 2023-02-18 NOTE — Telephone Encounter (Signed)
Called [employer] to confirm that they received LIS denial letter. Per rep they have received form and nothing appears to be missing to further process the application. Turnaround time is 24-72 hours.  Phone:[phone], option 1, option #  Knox Saliva, PharmD, MPH, BCPS, CPP Clinical Pharmacist (Rheumatology and Pulmonology)

## 2023-02-20 NOTE — Telephone Encounter (Signed)
Received a fax from  [employer] regarding an approval for [place] patient assistance from 02/18/23 to 12/08/23. Approval letter sent to scan center.  Phone number: [phone]  Called patient to advise. First shipment is scheduled to her home for 02/25/2023. She states she will reach out to [facility] personally later this afternoon to thank him for his help.  Knox Saliva, PharmD, MPH, BCPS, CPP Clinical Pharmacist (Rheumatology and Pulmonology)

## 2023-02-25 ENCOUNTER — Encounter: Payer: Self-pay | Admitting: Acute Care

## 2023-02-25 ENCOUNTER — Ambulatory Visit (INDEPENDENT_AMBULATORY_CARE_PROVIDER_SITE_OTHER): Payer: Medicare HMO | Admitting: Acute Care

## 2023-02-25 DIAGNOSIS — Z87891 Personal history of nicotine dependence: Secondary | ICD-10-CM | POA: Diagnosis not present

## 2023-02-25 DIAGNOSIS — F17211 Nicotine dependence, cigarettes, in remission: Secondary | ICD-10-CM | POA: Diagnosis not present

## 2023-02-25 NOTE — Progress Notes (Signed)
Virtual Visit via Telephone Note  I connected with [name] on 02/25/23 at  2:30 PM EDT by telephone and verified that I am speaking with the correct person using two identifiers.  Location: Patient:  At home Provider: [place], [place], [place], [address]    I discussed the limitations, risks, security and privacy concerns of performing an evaluation and management service by telephone and the availability of in person appointments. I also discussed with the patient that there may be a patient responsible charge related to this service. The patient expressed understanding and agreed to proceed.   Shared Decision Making Visit Lung Cancer Screening Program [phone])   Eligibility: Age 72 y.o. Pack Years Smoking History Calculation 48 pack year smoking history (# packs/per year x # years smoked) Recent History of coughing up blood  no Unexplained weight loss? no ( >Than 15 pounds within the last 6 months ) Prior History Lung / other cancer no (Diagnosis within the last 5 years already requiring surveillance chest CT Scans). Smoking Status Former Smoker Former Smokers: Years since quit: 11 years  Quit Date: 10/27/2011  Visit Components: Discussion included one or more decision making aids. yes Discussion included risk/benefits of screening. yes Discussion included potential follow up diagnostic testing for abnormal scans. yes Discussion included meaning and risk of over diagnosis. yes Discussion included meaning and risk of False Positives. yes Discussion included meaning of total radiation exposure. yes  Counseling Included: Importance of adherence to annual lung cancer LDCT screening. yes Impact of comorbidities on ability to participate in the program. yes Ability and willingness to under diagnostic treatment. yes  Smoking Cessation Counseling: Current Smokers:  Discussed importance of smoking cessation. yes Information about tobacco cessation classes and  interventions provided to patient. yes Patient provided with "ticket" for LDCT Scan. yes Symptomatic Patient. no  Counseling NA Diagnosis Code: Tobacco Use Z72.0 Asymptomatic Patient yes  Counseling (Intermediate counseling: > three minutes counseling) [id number] Former Smokers:  Discussed the importance of maintaining cigarette abstinence. yes Diagnosis Code: Personal History of Nicotine Dependence. [id number] Information about tobacco cessation classes and interventions provided to patient. Yes Patient provided with "ticket" for LDCT Scan. yes Written Order for Lung Cancer Screening with LDCT placed in Epic. Yes (CT Chest Lung Cancer Screening Low Dose W/O CM) [id number] Z12.2-Screening of respiratory organs Z87.891-Personal history of nicotine dependence  I spent 25 minutes of face to face time/virtual visit time  with  Ms. Yandyke discussing the risks and benefits of lung cancer screening. We took the time to pause the power point at intervals to allow for questions to be asked and answered to ensure understanding. We discussed that she had taken the single most powerful action possible to decrease her risk of developing lung cancer when she quit smoking. I counseled her to remain smoke free, and to contact me if she ever had the desire to smoke again so that I can provide resources and tools to help support the effort to remain smoke free. We discussed the time and location of the scan, and that either  Doroteo Glassman RN, Joella Prince, RN or I  or I will call / send a letter with the results within  24-72 hours of receiving them. She has the office contact information in the event she needs to speak with me,  she verbalized understanding of all of the above and had no further questions upon leaving the office.     I explained to the patient that there has  been a high incidence of coronary artery disease noted on these exams. I explained that this is a non-gated exam therefore degree or severity cannot  be determined. This patient is not on statin therapy. I have asked the patient to follow-up with their PCP regarding any incidental finding of coronary artery disease and management with diet or medication as they feel is clinically indicated. The patient verbalized understanding of the above and had no further questions.     Magdalen Spatz, NP 02/25/2023

## 2023-02-25 NOTE — Patient Instructions (Signed)
Thank you for participating in the [facility]. It was our pleasure to meet you today. We will call you with the results of your scan within the next few days. Your scan will be assigned a Lung RADS category score by the physicians reading the scans.  This Lung RADS score determines follow up scanning.  See below for description of categories, and follow up screening recommendations. We will be in touch to schedule your follow up screening annually or based on recommendations of our providers. We will fax a copy of your scan results to your Primary Care Physician, or the physician who referred you to the program, to ensure they have the results. Please call the office if you have any questions or concerns regarding your scanning experience or results.  Our office number is [phone]. Please speak with Denise Phelps, RN. , or  Denise Buckner RN, They are  our Lung Cancer Screening RN.'s If They are unavailable when you call, Please leave a message on the voice mail. We will return your call at our earliest convenience.This voice mail is monitored several times a day.  Remember, if your scan is normal, we will scan you annually as long as you continue to meet the criteria for the program. (Age 50-80, Current smoker or smoker who has quit within the last 15 years). If you are a smoker, remember, quitting is the single most powerful action that you can take to decrease your risk of lung cancer and other pulmonary, breathing related problems. We know quitting is hard, and we are here to help.  Please let us know if there is anything we can do to help you meet your goal of quitting. If you are a former smoker, congratulations. We are proud of you! Remain smoke free! Remember you can refer friends or family members through the number above.  We will screen them to make sure they meet criteria for the program. Thank you for helping us take better care of you by  participating in Lung Screening.  You can receive free nicotine replacement therapy ( patches, gum or mints) by calling 1-800-QUIT NOW. Please call so we can get you on the path to becoming  a non-smoker. I know it is hard, but you can do this!  Lung RADS Categories:  Lung RADS 1: no nodules or definitely non-concerning nodules.  Recommendation is for a repeat annual scan in 12 months.  Lung RADS 2:  nodules that are non-concerning in appearance and behavior with a very low likelihood of becoming an active cancer. Recommendation is for a repeat annual scan in 12 months.  Lung RADS 3: nodules that are probably non-concerning , includes nodules with a low likelihood of becoming an active cancer.  Recommendation is for a 6-month repeat screening scan. Often noted after an upper respiratory illness. We will be in touch to make sure you have no questions, and to schedule your 6-month scan.  Lung RADS 4 A: nodules with concerning findings, recommendation is most often for a follow up scan in 3 months or additional testing based on our provider's assessment of the scan. We will be in touch to make sure you have no questions and to schedule the recommended 3 month follow up scan.  Lung RADS 4 B:  indicates findings that are concerning. We will be in touch with you to schedule additional diagnostic testing based on our provider's  assessment of the scan.  Other options for assistance in smoking cessation (   As covered by your insurance benefits)  Hypnosis for smoking cessation  [employer] [phone]  Acupuncture for smoking cessation  [facility] [phone]   

## 2023-03-09 ENCOUNTER — Ambulatory Visit
Admission: RE | Admit: 2023-03-09 | Discharge: 2023-03-09 | Disposition: A | Discharge status: Home / Self Care | Payer: Medicare HMO | Source: Ambulatory Visit | Attending: Family Medicine | Admitting: Family Medicine

## 2023-03-09 DIAGNOSIS — J432 Centrilobular emphysema: Secondary | ICD-10-CM | POA: Diagnosis not present

## 2023-03-09 DIAGNOSIS — J929 Pleural plaque without asbestos: Secondary | ICD-10-CM | POA: Diagnosis not present

## 2023-03-09 DIAGNOSIS — Z122 Encounter for screening for malignant neoplasm of respiratory organs: Secondary | ICD-10-CM

## 2023-03-09 DIAGNOSIS — I2510 Atherosclerotic heart disease of native coronary artery without angina pectoris: Secondary | ICD-10-CM | POA: Diagnosis not present

## 2023-03-09 DIAGNOSIS — Z87891 Personal history of nicotine dependence: Secondary | ICD-10-CM

## 2023-03-10 ENCOUNTER — Telehealth: Payer: Self-pay | Admitting: Acute Care

## 2023-03-10 ENCOUNTER — Other Ambulatory Visit: Payer: Self-pay

## 2023-03-10 DIAGNOSIS — R911 Solitary pulmonary nodule: Secondary | ICD-10-CM

## 2023-03-10 DIAGNOSIS — Z87891 Personal history of nicotine dependence: Secondary | ICD-10-CM

## 2023-03-10 NOTE — Telephone Encounter (Signed)
Spoke with patient, using two patient identifiers, regarding results of LDCT. Atherosclerosis and emphysema noted.  Patient states she has never taken statin drugs and is not taking the Omega 3.  She will discuss with her PCP. Lung nodule noted with recommendation to repeat imaging in 6 months as a precaution versus waiting a year for annual LDCT.  Patient has no prior comparison imaging.  Patient acknowledged understanding and is in agreement with plan.  Results/plan faxed to PCP with note to review results and if recommended, discuss possible statin medication with patient.  New order placed for LDCT nodule follow up for October 2024.

## 2023-05-06 ENCOUNTER — Encounter: Payer: Self-pay | Admitting: Internal Medicine

## 2023-05-06 ENCOUNTER — Ambulatory Visit: Payer: Medicare HMO | Admitting: Internal Medicine

## 2023-05-06 VITALS — BP 124/72 | HR 61 | Temp 98.4°F | Ht 65.0 in | Wt 182.4 lb

## 2023-05-06 DIAGNOSIS — J449 Chronic obstructive pulmonary disease, unspecified: Secondary | ICD-10-CM | POA: Diagnosis not present

## 2023-05-06 DIAGNOSIS — J4489 Other specified chronic obstructive pulmonary disease: Secondary | ICD-10-CM

## 2023-05-06 DIAGNOSIS — J301 Allergic rhinitis due to pollen: Secondary | ICD-10-CM

## 2023-05-06 NOTE — Progress Notes (Signed)
Barbara [name]    [id number]    08/25/1951  Primary Care Physician:Sun, Charise Carwin, MD Date of Appointment: 05/06/2023 Established Patient Visit  Chief complaint:   Chief Complaint  Patient presents with   Follow-up    Pt needs handicap card, pt states dupixent has been helping.      HPI: Barbara [name] is a 72 y.o. woman with Asthma COPD overlap syndrome on dupixent.  Interval Updates: Here for follow up. Saw TP In Dec 2023.  She is now on dupixent. She has been on this since about February 2024.  She is on advair diskus now as well. Tolerating this fine.   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 She gets short of breath with significant exertion and wishes she could exercise more.   Hasn't needed any prednisone since starting dupixent.   Current Regimen: advair 500 1 puff BID, spiriva, prn albuterol Asthma Triggers: allergies, being outside, exertion, cleaning solutions, heat Exacerbations in the last year: non requiring prednisone History of hospitalization or intubation:none Allergy Testing: never had.  GERD: yes on PPI once daily Allergic Rhinitis: on cetirizine, singulair ACT:  Asthma Control Test ACT Total Score  12/02/2022  1:36 PM 21  04/08/2022  1:31 PM 20  10/08/2021  1:52 PM 21   FeNO: 137 ppb ---->24 ppb 09/18/22  I have reviewed the patient's family social and past medical history and updated as appropriate.   Past Medical History:  Diagnosis Date   Arthritis    right knee, neck   Asthma    COPD (chronic obstructive pulmonary disease) (HCC)    Hypertension    Mouth trouble    infection from tooth that was pulled approx. mid Mar. 2013    Past Surgical History:  Procedure Laterality Date   ABDOMINAL HYSTERECTOMY     SHOULDER SURGERY  in age 89's   cyst removed from [place] socket of left shoulder   TONSILLECTOMY  age 43   TUBAL LIGATION       Family History  Problem Relation Age of Onset   Dementia Mother    Kidney disease Mother    Asthma Sister    Mental illness Brother    Breast cancer Neg Hx     Social History   Occupational History   Not on file  Tobacco Use   Smoking status: Former    Packs/day: 1.00    Years: 45.00    Additional pack years: 0.00    Total pack years: 45.00    Types: Cigarettes    Quit date: 10/27/2011    Years since quitting: 11.5   Smokeless tobacco: Never  Substance and Sexual Activity   Alcohol use: No   Drug use: No   Sexual activity: Never     Physical Exam: Blood pressure 124/72, pulse 61, temperature 98.4 F (36.9 C), temperature source Oral, height 5\' 5"  (1.651 m), weight 182 lb 6.4 oz (82.7 kg), SpO2 97 %.  Gen:      No distress, obese Lungs:   ctab no wheeze CV:         RRR no mrg   Data Reviewed: Imaging: I have personally reviewed the chest xray April 2018 - mild cardiomegaly  PFTs:      Latest Ref Rng & Units 04/17/2021    2:55 PM  PFT Results  FVC-Pre L  1.58   FVC-Predicted Pre % 68   FVC-Post L 1.72   FVC-Predicted Post % 74   Pre FEV1/FVC % % 63   Post FEV1/FCV % % 66   FEV1-Pre L 0.99   FEV1-Predicted Pre % 55   FEV1-Post L 1.13    I have personally reviewed the patient's PFTs and moderately severe airflow limitation  Labs: IgE elevated 1,061 Sensitive to dust mites, cats, dogs, grass, cockroach, mouse  Immunization status: Immunization History  Administered Date(s) Administered   Hepatitis A, Adult 03/12/2016, 09/08/2016   Influenza Split 10/22/2011, 09/24/2012, 09/22/2013, 10/06/2013, 09/07/2014, 09/08/2015, 09/04/2017, 09/03/2018   Influenza, High Dose Seasonal PF 08/08/2021   Influenza-Unspecified 09/19/2022   PFIZER(Purple Top)SARS-COV-2 Vaccination 01/02/2020, 01/23/2020, 03/20/2021, 08/16/2021   Pneumococcal Conjugate-13 07/02/2015   Pneumococcal Polysaccharide-23 10/22/2011, 01/04/2019   Tdap 01/02/2016   Zoster, Live  01/02/2016, 10/21/2017, 12/22/2017    Assessment:  Allergic Asthma COPD Overlap Syndrome, with improvement of symptoms on dupixent.  History of tobacco use disorder, in remission.  GERD controlled Allergic Rhinitis controlled Elevated FeNO  Plan/Recommendations: Continue Advair, spiriva, albuterol for now. Continue dupixent - dramatic improvement in symptoms.  continue PPI once daily continue claritin, and singulair for rhinitis. Refer to pulmonary rehab   Return to Care: Return in about 6 months (around 11/06/2023).   Durel Salts, MD Pulmonary and Critical Care Medicine [facility] Office:[phone]

## 2023-05-06 NOTE — Patient Instructions (Addendum)
Please schedule follow up scheduled with myself in 6 months.  If my schedule is not open yet, we will contact you with a reminder closer to that time. Please call [phone] if you haven't heard from [place] a month before.   Glad your breathing is better on dupixent! Continue this.  Continue advair and spiriva.  Keep up the allergy medications as well.  Call me sooner if issues or concerns about your breathing.  I will refer you to pulmonary rehab for your exercise tolerance - this will help a lot with shortness of breath! They will contact you to schedule - let me know if you haven't heard anything.

## 2023-05-26 ENCOUNTER — Other Ambulatory Visit: Payer: Self-pay | Admitting: Internal Medicine

## 2023-05-26 DIAGNOSIS — J4489 Other specified chronic obstructive pulmonary disease: Secondary | ICD-10-CM

## 2023-06-03 ENCOUNTER — Encounter (HOSPITAL_COMMUNITY)
Admission: RE | Admit: 2023-06-03 | Discharge: 2023-06-03 | Disposition: A | Discharge status: Home / Self Care | Payer: Medicare HMO | Source: Ambulatory Visit | Attending: Internal Medicine | Admitting: Internal Medicine

## 2023-06-03 ENCOUNTER — Encounter (HOSPITAL_COMMUNITY): Payer: Self-pay

## 2023-06-03 VITALS — BP 114/60 | HR 65 | Wt 181.7 lb

## 2023-06-03 DIAGNOSIS — J449 Chronic obstructive pulmonary disease, unspecified: Secondary | ICD-10-CM | POA: Diagnosis not present

## 2023-06-03 HISTORY — DX: Chronic kidney disease, unspecified: N18.9

## 2023-06-03 NOTE — Progress Notes (Signed)
Pulmonary Individual Treatment Plan  Patient Details  Name: Barbara [name] MRN: [id number] Date of Birth: 1951-06-16 Referring Provider:   Doristine Devoid Pulmonary Rehab Walk Test from 06/03/2023 in [facility]  Referring Provider [name]       Initial Encounter Date:  Flowsheet Row Pulmonary Rehab Walk Test from 06/03/2023 in [facility]  Date 06/03/23       Visit Diagnosis: Stage 2 moderate COPD by GOLD classification (HCC)  Patient's Home Medications on Admission:   Current Outpatient Medications:    acetaminophen (TYLENOL) 500 MG tablet, Take 1,000 mg by mouth in the morning and at bedtime., Disp: , Rfl:    albuterol (PROVENTIL HFA;VENTOLIN HFA) 108 (90 BASE) MCG/ACT inhaler, Inhale 2 puffs into the lungs every 6 (six) hours as needed. For shortness of breath, Disp: , Rfl:    albuterol (PROVENTIL) (2.5 MG/3ML) 0.083% nebulizer solution, Take 3 mLs (2.5 mg total) by nebulization every 6 (six) hours as needed. Breathing, Disp: 75 mL, Rfl: 3   amLODipine (NORVASC) 10 MG tablet, Take 10 mg by mouth daily., Disp: , Rfl:    atorvastatin (LIPITOR) 10 MG tablet, Take 10 mg by mouth daily., Disp: , Rfl:    Calcium Carbonate-Vit D-Min (CALCIUM 1200 PO), Take 1 tablet by mouth daily., Disp: , Rfl:    Cholecalciferol (VITAMIN D3) 50 MCG (2000 UT) TABS, Take by mouth., Disp: , Rfl:    Cod Liver Oil CAPS, Take 1 capsule by mouth daily., Disp: , Rfl:    Cyanocobalamin (VITAMIN B 12 PO), Take 250 mcg by mouth daily., Disp: , Rfl:    Dupilumab (DUPIXENT) 300 MG/2ML SOPN, Inject 300 mg into the skin every 14 (fourteen) days. **already on maintenance**, Disp: 12 mL, Rfl: 1   fluticasone-salmeterol (ADVAIR) 500-50 MCG/ACT AEPB, INHALE 1 PUFF INTO THE LUNGS IN THE MORNING AND AT BEDTIME., Disp: 180 each, Rfl: 3   hydrochlorothiazide (HYDRODIURIL) 25 MG tablet, Take 25 mg by mouth daily.,  Disp: , Rfl:    montelukast (SINGULAIR) 10 MG tablet, TAKE 1 TABLET (10 MG TOTAL) BY MOUTH AT BEDTIME., Disp: 90 tablet, Rfl: 3   montelukast (SINGULAIR) 10 MG tablet, 1 tablet, Disp: , Rfl:    montelukast (SINGULAIR) 10 MG tablet, TAKE 1 TABLET AT BEDTIME (NEED MD APPOINTMENT), Disp: 90 tablet, Rfl: 3   Omega-3 Fatty Acids (FISH OIL) 1200 MG CAPS, Take 1 capsule by mouth daily., Disp: , Rfl:    pantoprazole (PROTONIX) 40 MG tablet, 1 tablet, Disp: , Rfl:    pantoprazole (PROTONIX) 40 MG tablet, TAKE 1 TABLET (40 MG TOTAL) BY MOUTH DAILY., Disp: 90 tablet, Rfl: 1   pantoprazole (PROTONIX) 40 MG tablet, TAKE 1 TABLET (40 MG TOTAL) BY MOUTH DAILY., Disp: 90 tablet, Rfl: 3   Peak Flow Meter DEVI, Dispense peak flow meter for asthma, Disp: 1 each, Rfl: 0   RESTASIS 0.05 % ophthalmic emulsion, 1 drop 2 (two) times daily., Disp: , Rfl:    Spacer/Aero-Holding Chambers DEVI, Use with inhalers as directed., Disp: 1 each, Rfl: 1   SPIRIVA RESPIMAT 2.5 MCG/ACT AERS, , Disp: , Rfl:    Tiotropium Bromide Monohydrate (SPIRIVA RESPIMAT) 2.5 MCG/ACT AERS, Inhale 2 each into the lungs daily., Disp: , Rfl:    TURMERIC CURCUMIN PO, Take 1 capsule by mouth daily., Disp: , Rfl:    loratadine (CLARITIN) 10 MG tablet, Take 10 mg by mouth daily. (Patient not  taking: Reported on 06/03/2023), Disp: , Rfl:    traMADol (ULTRAM) 50 MG tablet, Take 50 mg by mouth every 12 (twelve) hours as needed for moderate pain. (Patient not taking: Reported on 06/03/2023), Disp: , Rfl:   Past Medical History: Past Medical History:  Diagnosis Date   Arthritis    right knee, neck   Asthma    Chronic kidney disease    COPD (chronic obstructive pulmonary disease) (HCC)    Hypertension    Mouth trouble    infection from tooth that was pulled approx. mid Mar. 2013    Tobacco Use: Social History   Tobacco Use  Smoking Status Former   Packs/day: 1.00   Years: 45.00   Additional pack years: 0.00   Total pack years: 45.00   Types:  Cigarettes   Quit date: 10/27/2011   Years since quitting: 11.6  Smokeless Tobacco Never    Labs: Review Flowsheet       Latest Ref Rng & Units 10/21/2011 11/26/2011 03/10/2012  Labs for ITP Cardiac and Pulmonary Rehab  PH, Arterial 7.350 - 7.400 - 7.407  7.411   PCO2 arterial 35.0 - 45.0 mmHg - 38.2  41.3   Bicarbonate 20.0 - 24.0 mEq/L - 23.6  25.7   TCO2 0 - 100 mmol/L 26  21.0  23.0   Acid-base deficit 0.0 - 2.0 mmol/L - 0.3  -  O2 Saturation % - 96.6  92.0     Capillary Blood Glucose: No results found for: "GLUCAP"   Pulmonary Assessment Scores:  Pulmonary Assessment Scores     Row Name 06/03/23 1333         ADL UCSD   ADL Phase Entry     SOB Score total 56       CAT Score   CAT Score 25       mMRC Score   mMRC Score 3             UCSD: Self-administered rating of dyspnea associated with activities of daily living (ADLs) 6-point scale (0 = "not at all" to 5 = "maximal or unable to do because of breathlessness")  Scoring Scores range from 0 to 120.  Minimally important difference is 5 units  CAT: CAT can identify the health impairment of COPD patients and is better correlated with disease progression.  CAT has a scoring range of zero to 40. The CAT score is classified into four groups of low (less than 10), medium (10 - 20), high (21-30) and very high (31-40) based on the impact level of disease on health status. A CAT score over 10 suggests significant symptoms.  A worsening CAT score could be explained by an exacerbation, poor medication adherence, poor inhaler technique, or progression of COPD or comorbid conditions.  CAT MCID is 2 points  mMRC: mMRC (Modified Medical Research Council) Dyspnea Scale is used to assess the degree of baseline functional disability in patients of respiratory disease due to dyspnea. No minimal important difference is established. A decrease in score of 1 point or greater is considered a positive change.   Pulmonary Function  Assessment:  Pulmonary Function Assessment - 06/03/23 1332       Breath   Bilateral Breath Sounds Clear    Shortness of Breath Yes             Exercise Target Goals: Exercise Program Goal: Individual exercise prescription set using results from initial 6 min walk test and THRR while considering  patient's activity barriers and safety.  Exercise Prescription Goal: Initial exercise prescription builds to 30-45 minutes a day of aerobic activity, 2-3 days per week.  Home exercise guidelines will be given to patient during program as part of exercise prescription that the participant will acknowledge.  Activity Barriers & Risk Stratification:  Activity Barriers & Cardiac Risk Stratification - 06/03/23 1326       Activity Barriers & Cardiac Risk Stratification   Activity Barriers Deconditioning;Muscular Weakness;Shortness of Breath;Arthritis    Cardiac Risk Stratification Low             6 Minute Walk:  6 Minute Walk     Row Name 06/03/23 1421         6 Minute Walk   Phase Initial     Distance 812 feet     Walk Time 6 minutes     # of Rest Breaks 0     MPH 1.54     METS 1.56     RPE 11     Perceived Dyspnea  1     VO2 Peak 5.47     Symptoms Yes (comment)     Comments fatigue/SOB     Resting HR 57 bpm     Resting BP 114/60     Resting Oxygen Saturation  96 %     Exercise Oxygen Saturation  during 6 min walk 89 %     Max Ex. HR 88 bpm     Max Ex. BP 136/80     2 Minute Post BP 106/60       Interval HR   1 Minute HR 73     2 Minute HR 88     3 Minute HR 88     4 Minute HR 88     5 Minute HR 85     6 Minute HR 87     2 Minute Post HR 71     Interval Heart Rate? Yes       Interval Oxygen   Interval Oxygen? Yes     Baseline Oxygen Saturation % 96 %     1 Minute Oxygen Saturation % 93 %     1 Minute Liters of Oxygen 0 L     2 Minute Oxygen Saturation % 91 %     2 Minute Liters of Oxygen 0 L     3 Minute Oxygen Saturation % 91 %     3 Minute Liters  of Oxygen 0 L     4 Minute Oxygen Saturation % 90 %     4 Minute Liters of Oxygen 0 L     5 Minute Oxygen Saturation % 93 %     5 Minute Liters of Oxygen 0 L     6 Minute Oxygen Saturation % 89 %     6 Minute Liters of Oxygen 0 L     2 Minute Post Oxygen Saturation % 97 %     2 Minute Post Liters of Oxygen 0 L              Oxygen Initial Assessment:  Oxygen Initial Assessment - 06/03/23 1328       Home Oxygen   Home Oxygen Device None    Sleep Oxygen Prescription None    Home Exercise Oxygen Prescription None    Home Resting Oxygen Prescription None      Initial 6 min Walk   Oxygen Used None      Program Oxygen Prescription   Program Oxygen Prescription None  Intervention   Short Term Goals To learn and understand importance of maintaining oxygen saturations>88%;To learn and demonstrate proper use of respiratory medications;To learn and understand importance of monitoring SPO2 with pulse oximeter and demonstrate accurate use of the pulse oximeter.;To learn and demonstrate proper pursed lip breathing techniques or other breathing techniques.     Long  Term Goals Maintenance of O2 saturations>88%;Compliance with respiratory medication;Verbalizes importance of monitoring SPO2 with pulse oximeter and return demonstration;Exhibits proper breathing techniques, such as pursed lip breathing or other method taught during program session;Demonstrates proper use of MDI's             Oxygen Re-Evaluation:   Oxygen Discharge (Final Oxygen Re-Evaluation):   Initial Exercise Prescription:  Initial Exercise Prescription - 06/03/23 1400       Date of Initial Exercise RX and Referring Provider   Date 06/03/23    Referring Provider [name]    Expected Discharge Date 08/27/23      NuStep   Level 2    SPM 60    Minutes 15    METs 1.5      Track   Minutes 15    METs 1.5      Prescription Details   Frequency (times per week) 2    Duration Progress to 30 minutes of  continuous aerobic without signs/symptoms of physical distress      Intensity   THRR 40-80% of Max Heartrate 58-117    Ratings of Perceived Exertion 11-13    Perceived Dyspnea 0-4      Progression   Progression Continue progressive overload as per policy without signs/symptoms or physical distress.      Resistance Training   Training Prescription Yes    Weight blue bands    Reps 10-15             Perform Capillary Blood Glucose checks as needed.  Exercise Prescription Changes:   Exercise Comments:   Exercise Goals and Review:   Exercise Goals     Row Name 06/03/23 1327             Exercise Goals   Increase Physical Activity Yes       Intervention Provide advice, education, support and counseling about physical activity/exercise needs.;Develop an individualized exercise prescription for aerobic and resistive training based on initial evaluation findings, risk stratification, comorbidities and participant's personal goals.       Expected Outcomes Short Term: Attend rehab on a regular basis to increase amount of physical activity.;Long Term: Exercising regularly at least 3-5 days a week.;Long Term: Add in home exercise to make exercise part of routine and to increase amount of physical activity.       Increase Strength and Stamina Yes       Intervention Provide advice, education, support and counseling about physical activity/exercise needs.;Develop an individualized exercise prescription for aerobic and resistive training based on initial evaluation findings, risk stratification, comorbidities and participant's personal goals.       Expected Outcomes Short Term: Increase workloads from initial exercise prescription for resistance, speed, and METs.;Short Term: Perform resistance training exercises routinely during rehab and add in resistance training at home;Long Term: Improve cardiorespiratory fitness, muscular endurance and strength as measured by increased METs and  functional capacity ( )       Able to understand and use rate of perceived exertion (RPE) scale Yes       Intervention Provide education and explanation on how to use RPE scale       Expected  Outcomes Short Term: Able to use RPE daily in rehab to express subjective intensity level;Long Term:  Able to use RPE to guide intensity level when exercising independently       Able to understand and use Dyspnea scale Yes       Intervention Provide education and explanation on how to use Dyspnea scale       Expected Outcomes Short Term: Able to use Dyspnea scale daily in rehab to express subjective sense of shortness of breath during exertion;Long Term: Able to use Dyspnea scale to guide intensity level when exercising independently       Knowledge and understanding of Target Heart Rate Range (THRR) Yes       Intervention Provide education and explanation of THRR including how the numbers were predicted and where they are located for reference       Expected Outcomes Short Term: Able to state/look up THRR;Long Term: Able to use THRR to govern intensity when exercising independently;Short Term: Able to use daily as guideline for intensity in rehab       Understanding of Exercise Prescription Yes       Intervention Provide education, explanation, and written materials on patient's individual exercise prescription       Expected Outcomes Short Term: Able to explain program exercise prescription;Long Term: Able to explain home exercise prescription to exercise independently                Exercise Goals Re-Evaluation :   Discharge Exercise Prescription (Final Exercise Prescription Changes):   Nutrition:  Target Goals: Understanding of nutrition guidelines, daily intake of sodium 1500mg , cholesterol 200mg , calories 30% from fat and 7% or less from saturated fats, daily to have 5 or more servings of fruits and vegetables.  Biometrics:  Pre Biometrics - 06/03/23 1425       Pre Biometrics   Grip  Strength 34 kg              Nutrition Therapy Plan and Nutrition Goals:   Nutrition Assessments:  MEDIFICTS Score Key: ?70 Need to make dietary changes  40-70 Heart Healthy Diet ? 40 Therapeutic Level Cholesterol Diet   Picture Your Plate Scores: <45 Unhealthy dietary pattern with much room for improvement. 41-50 Dietary pattern unlikely to meet recommendations for good health and room for improvement. 51-60 More healthful dietary pattern, with some room for improvement.  >60 Healthy dietary pattern, although there may be some specific behaviors that could be improved.    Nutrition Goals Re-Evaluation:   Nutrition Goals Discharge (Final Nutrition Goals Re-Evaluation):   Psychosocial: Target Goals: Acknowledge presence or absence of significant depression and/or stress, maximize coping skills, provide positive support system. Participant is able to verbalize types and ability to use techniques and skills needed for reducing stress and depression.  Initial Review & Psychosocial Screening:  Initial Psych Review & Screening - 06/03/23 1319       Initial Review   Current issues with Current Stress Concerns    Source of Stress Concerns Family    Comments Nelli expresses she has stress due to being her brothers healthcare power of attorney. He has a brain injury and lives in an assisted living facility. Shanik washes his clothes weekly and goes with him to all of his appointments.      Family Dynamics   Good Support System? Yes      Barriers   Psychosocial barriers to participate in program The patient should benefit from training in stress management and relaxation.  Screening Interventions   Interventions Encouraged to exercise;To provide support and resources with identified psychosocial needs    Expected Outcomes Short Term goal: Utilizing psychosocial counselor, staff and physician to assist with identification of specific Stressors or current issues  interfering with healing process. Setting desired goal for each stressor or current issue identified.;Long Term Goal: Stressors or current issues are controlled or eliminated.             Quality of Life Scores:  Scores of 19 and below usually indicate a poorer quality of life in these areas.  A difference of  2-3 points is a clinically meaningful difference.  A difference of 2-3 points in the total score of the Quality of Life Index has been associated with significant improvement in overall quality of life, self-image, physical symptoms, and general health in studies assessing change in quality of life.  PHQ-9: Review Flowsheet       06/03/2023  Depression screen PHQ 2/9  Decreased Interest 0  Down, Depressed, Hopeless 0  PHQ - 2 Score 0  Altered sleeping 0  Tired, decreased energy 0  Change in appetite 0  Feeling bad or failure about yourself  0  Trouble concentrating 0  Moving slowly or fidgety/restless 0  Suicidal thoughts 0  PHQ-9 Score 0  Difficult doing work/chores Not difficult at all   Interpretation of Total Score  Total Score Depression Severity:  1-4 = Minimal depression, 5-9 = Mild depression, 10-14 = Moderate depression, 15-19 = Moderately severe depression, 20-27 = Severe depression   Psychosocial Evaluation and Intervention:  Psychosocial Evaluation - 06/03/23 1320       Psychosocial Evaluation & Interventions   Interventions Encouraged to exercise with the program and follow exercise prescription;Stress management education    Comments Jaynie expresses she has stress due to being her brothers healthcare power of attorney. He has a brain injury and lives in an assisted living facility. Infinity washes his clothes weekly and goes with him to all of his appointments.    Expected Outcomes For Giuseppina to participate in PR free of any psychosocial barriers or concerns    Continue Psychosocial Services  No Follow up required             Psychosocial  Re-Evaluation:   Psychosocial Discharge (Final Psychosocial Re-Evaluation):   Education: Education Goals: Education classes will be provided on a weekly basis, covering required topics. Participant will state understanding/return demonstration of topics presented.  Learning Barriers/Preferences:  Learning Barriers/Preferences - 06/03/23 1325       Learning Barriers/Preferences   Learning Barriers Sight    Learning Preferences Group Instruction;Skilled Demonstration             Education Topics: Introduction to Pulmonary Rehab Group instruction provided by PowerPoint, verbal discussion, and written material to support subject matter. Instructor reviews what Pulmonary Rehab is, the purpose of the program, and how patients are referred.     Know Your Numbers Group instruction that is supported by a PowerPoint presentation. Instructor discusses importance of knowing and understanding resting, exercise, and post-exercise oxygen saturation, heart rate, and blood pressure. Oxygen saturation, heart rate, blood pressure, rating of perceived exertion, and dyspnea are reviewed along with a normal range for these values.    Exercise for the Pulmonary Patient Group instruction that is supported by a PowerPoint presentation. Instructor discusses benefits of exercise, core components of exercise, frequency, duration, and intensity of an exercise routine, importance of utilizing pulse oximetry during exercise, safety while exercising, and options  of places to exercise outside of rehab.       MET Level  Group instruction provided by PowerPoint, verbal discussion, and written material to support subject matter. Instructor reviews what METs are and how to increase METs.    Pulmonary Medications Verbally interactive group education provided by instructor with focus on inhaled medications and proper administration.   Anatomy and Physiology of the Respiratory System Group instruction  provided by PowerPoint, verbal discussion, and written material to support subject matter. Instructor reviews respiratory cycle and anatomical components of the respiratory system and their functions. Instructor also reviews differences in obstructive and restrictive respiratory diseases with examples of each.    Oxygen Safety Group instruction provided by PowerPoint, verbal discussion, and written material to support subject matter. There is an overview of "What is Oxygen" and "Why do we need it".  Instructor also reviews how to create a safe environment for oxygen use, the importance of using oxygen as prescribed, and the risks of noncompliance. There is a brief discussion on traveling with oxygen and resources the patient may utilize.   Oxygen Use Group instruction provided by PowerPoint, verbal discussion, and written material to discuss how supplemental oxygen is prescribed and different types of oxygen supply systems. Resources for more information are provided.    Breathing Techniques Group instruction that is supported by demonstration and informational handouts. Instructor discusses the benefits of pursed lip and diaphragmatic breathing and detailed demonstration on how to perform both.     Risk Factor Reduction Group instruction that is supported by a PowerPoint presentation. Instructor discusses the definition of a risk factor, different risk factors for pulmonary disease, and how the heart and lungs work together.   MD Day A group question and answer session with a medical doctor that allows participants to ask questions that relate to their pulmonary disease state.   Nutrition for the Pulmonary Patient Group instruction provided by PowerPoint slides, verbal discussion, and written materials to support subject matter. The instructor gives an explanation and review of healthy diet recommendations, which includes a discussion on weight management, recommendations for fruit and  vegetable consumption, as well as protein, fluid, caffeine, fiber, sodium, sugar, and alcohol. Tips for eating when patients are short of breath are discussed.    Other Education Group or individual verbal, written, or video instructions that support the educational goals of the pulmonary rehab program.    Knowledge Questionnaire Score:  Knowledge Questionnaire Score - 06/03/23 1334       Knowledge Questionnaire Score   Pre Score 17/18             Core Components/Risk Factors/Patient Goals at Admission:  Personal Goals and Risk Factors at Admission - 06/03/23 1325       Core Components/Risk Factors/Patient Goals on Admission    Weight Management Weight Loss    Improve shortness of breath with ADL's Yes    Intervention Provide education, individualized exercise plan and daily activity instruction to help decrease symptoms of SOB with activities of daily living.    Expected Outcomes Short Term: Improve cardiorespiratory fitness to achieve a reduction of symptoms when performing ADLs;Long Term: Be able to perform more ADLs without symptoms or delay the onset of symptoms    Increase knowledge of respiratory medications and ability to use respiratory devices properly  Yes    Intervention Provide education and demonstration as needed of appropriate use of medications, inhalers, and oxygen therapy.    Expected Outcomes Short Term: Achieves understanding of medications use.  Understands that oxygen is a medication prescribed by physician. Demonstrates appropriate use of inhaler and oxygen therapy.;Long Term: Maintain appropriate use of medications, inhalers, and oxygen therapy.    Stress Yes    Intervention Offer individual and/or small group education and counseling on adjustment to heart disease, stress management and health-related lifestyle change. Teach and support self-help strategies.;Refer participants experiencing significant psychosocial distress to appropriate mental health  specialists for further evaluation and treatment. When possible, include family members and significant others in education/counseling sessions.    Expected Outcomes Short Term: Participant demonstrates changes in health-related behavior, relaxation and other stress management skills, ability to obtain effective social support, and compliance with psychotropic medications if prescribed.;Long Term: Emotional wellbeing is indicated by absence of clinically significant psychosocial distress or social isolation.             Core Components/Risk Factors/Patient Goals Review:    Core Components/Risk Factors/Patient Goals at Discharge (Final Review):    ITP Comments:   Comments: Dr. Mechele Collin is Medical Director for Pulmonary Rehab at [facility].

## 2023-06-03 NOTE — Progress Notes (Signed)
Barbara [name] 72 y.o. female  Initial Psychosocial Assessment  Pt psychosocial assessment reveals pt lives alone. Pt is currently working a part time job as a Teacher, English as a foreign language. Pt hobbies include  playing the piano . Pt reports her stress level is moderate. Areas of stress/anxiety include health and family .  Pt does not exhibit signs of depression. Signs of depression include Pt shows good  coping skills with positive outlook . Offered emotional support and reassurance. Monitor and evaluate progress toward psychosocial goal(s).  Goal(s): Improved management of stress Improved coping skills Help patient work toward returning to meaningful activities that improve patient's QOL and are attainable with patient's lung disease   06/03/2023 1:55 PM

## 2023-06-03 NOTE — Progress Notes (Signed)
Barbara [name] 72 y.o. female Pulmonary Rehab Orientation Note This patient who was referred to Pulmonary Rehab by Dr. [name] with the diagnosis of COPD 2 arrived today in Cardiac and Pulmonary Rehab. She  arrived ambulatory with normal gait. She  does not carry portable oxygen.  Per patient, Barbara [name] uses oxygen never. Color good, skin warm and dry. Patient is oriented to time and place. Patient's medical history, psychosocial health, and medications reviewed. Psychosocial assessment reveals patient lives with alone. 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 Patient hobbies include  playing piano . Patient reports her stress level is moderate. Areas of stress/anxiety include health and family . Patient does not exhibit signs of depression. PHQ2/9 score 0/0. Barbara [name] shows good  coping skills with positive outlook on life. Offered emotional support and reassurance. Will continue to monitor. Physical assessment performed by Nurse pick: Barbara Hart RN. Please see their orientation physical assessment note. Barbara [name] reports she does take medications as prescribed. Patient states she follows a regular  diet. Patient would like to lose weight. Patient's weight will be monitored closely. Demonstration and practice of PLB using pulse oximeter. Barbara [name] able to return demonstration satisfactorily. Safety and hand hygiene in the exercise area reviewed with patient. Barbara [name] voices understanding of the information reviewed. Department expectations discussed with patient and achievable goals were set. The patient shows enthusiasm about attending the program and we look forward to working with Barbara [name]. Barbara [name] completed a 6 min walk test today and is scheduled to begin exercise on 06/09/23@1 :15.   [id number] Barbara [name], Barbara [name]

## 2023-06-09 ENCOUNTER — Encounter (HOSPITAL_COMMUNITY)
Admission: RE | Admit: 2023-06-09 | Discharge: 2023-06-09 | Disposition: A | Discharge status: Home / Self Care | Payer: Medicare HMO | Source: Ambulatory Visit | Attending: Internal Medicine | Admitting: Internal Medicine

## 2023-06-09 VITALS — Wt 180.8 lb

## 2023-06-09 DIAGNOSIS — J449 Chronic obstructive pulmonary disease, unspecified: Secondary | ICD-10-CM | POA: Insufficient documentation

## 2023-06-09 NOTE — Progress Notes (Signed)
Daily Session Note  Patient Details  Name: Barbara [name] MRN: [id number] Date of Birth: 17-Oct-1951 Referring Provider:   Doristine Devoid Pulmonary Rehab Walk Test from 06/03/2023 in [facility]  Referring Provider [name]       Encounter Date: 06/09/2023  Check In:  Session Check In - 06/09/23 1444       Check-In   Supervising physician immediately available to respond to emergencies [facility] MD immediately available    Physician(s) Robin Searing, NP    Location [facility]    Staff Present Samantha Belarus, RD, LDN;Olinty Peggye Pitt, MS, ACSM-CEP, Exercise Physiologist;Mary Gerre Scull, RN, BSN;Deran Barro Idelle Crouch BS, ACSM-CEP, Exercise Physiologist;Kaylee Earlene Plater, MS, ACSM-CEP, Exercise Physiologist    Virtual Visit No    Medication changes reported     No    Fall or balance concerns reported    No    Tobacco Cessation No Change    Warm-up and Cool-down Performed as group-led instruction    Resistance Training Performed Yes    VAD Patient? No    PAD/SET Patient? No      Pain Assessment   Currently in Pain? No/denies    Multiple Pain Sites No             Capillary Blood Glucose: No results found for this or any previous visit (from the past 24 hour(s)).    Social History   Tobacco Use  Smoking Status Former   Packs/day: 1.00   Years: 45.00   Additional pack years: 0.00   Total pack years: 45.00   Types: Cigarettes   Quit date: 10/27/2011   Years since quitting: 11.6  Smokeless Tobacco Never    Goals Met:  Exercise tolerated well No report of concerns or symptoms today Strength training completed today  Goals Unmet:  Not Applicable  Comments: Service time is from 1308 to 1455.    Dr. Mechele Collin is Medical Director for Pulmonary Rehab at [facility].

## 2023-06-16 ENCOUNTER — Encounter (HOSPITAL_COMMUNITY): Payer: Self-pay

## 2023-06-16 ENCOUNTER — Encounter (HOSPITAL_COMMUNITY)
Admission: RE | Admit: 2023-06-16 | Discharge: 2023-06-16 | Disposition: A | Discharge status: Home / Self Care | Payer: Medicare HMO | Source: Ambulatory Visit | Attending: Internal Medicine | Admitting: Internal Medicine

## 2023-06-16 DIAGNOSIS — J449 Chronic obstructive pulmonary disease, unspecified: Secondary | ICD-10-CM

## 2023-06-16 NOTE — Progress Notes (Signed)
Daily Session Note  Patient Details  Name: Barbara [name] MRN: [id number] Date of Birth: [birth year] Referring Provider:   Doristine Devoid Pulmonary Rehab Walk Test from 06/03/2023 in [facility]  Referring Provider [name]       Encounter Date: 06/16/2023  Check In:  Session Check In - 06/16/23 1421       Check-In   Supervising physician immediately available to respond to emergencies [facility] MD immediately available    Physician(s) Carlyon Shadow, NP    Location [facility]    Staff Present Velora Mediate, RN, MSN;Samantha Belarus, RD, Dutch Gray, RN, BSN;Randi Reeve BS, ACSM-CEP, Exercise Physiologist;Kaylee Earlene Plater, MS, ACSM-CEP, Exercise Physiologist;Casey Katrinka Blazing, RT    Virtual Visit No    Medication changes reported     No    Fall or balance concerns reported    No    Tobacco Cessation No Change    Warm-up and Cool-down Performed as group-led instruction    Resistance Training Performed Yes    VAD Patient? No    PAD/SET Patient? No      Pain Assessment   Currently in Pain? No/denies    Multiple Pain Sites No             Capillary Blood Glucose: No results found for this or any previous visit (from the past 24 hour(s)).    Social History   Tobacco Use  Smoking Status Former   Packs/day: 1.00   Years: 45.00   Additional pack years: 0.00   Total pack years: 45.00   Types: Cigarettes   Quit date: 10/27/2011   Years since quitting: 11.6  Smokeless Tobacco Never    Goals Met:  Exercise tolerated well No report of concerns or symptoms today Strength training completed today  Goals Unmet:  Not Applicable  Comments: Service time is from 1308 to 1508    Dr. Mechele Collin is Medical Director for Pulmonary Rehab at [facility].

## 2023-06-16 NOTE — Progress Notes (Deleted)
Daily Session Note  Patient Details  Name: Barbara [name] MRN: [id number] Date of Birth: 18-May-1951 Referring Provider:   Doristine Devoid Pulmonary Rehab Walk Test from 06/03/2023 in [facility]  Referring Provider [name]       Encounter Date: 06/09/2023  Check In:   Capillary Blood Glucose: No results found for this or any previous visit (from the past 24 hour(s)).   Exercise Prescription Changes - 06/16/23 1500       Response to Exercise   Blood Pressure (Admit) 100/62    Blood Pressure (Exercise) 126/60    Blood Pressure (Exit) 110/70    Heart Rate (Admit) 67 bpm    Heart Rate (Exercise) 82 bpm    Heart Rate (Exit) 63 bpm    Oxygen Saturation (Admit) 97 %    Oxygen Saturation (Exercise) 95 %    Oxygen Saturation (Exit) 97 %    Rating of Perceived Exertion (Exercise) 12    Perceived Dyspnea (Exercise) 1    Duration Progress to 30 minutes of  aerobic without signs/symptoms of physical distress    Intensity THRR unchanged      Progression   Progression Continue to progress workloads to maintain intensity without signs/symptoms of physical distress.      Resistance Training   Training Prescription Yes    Weight blue bands    Reps 10-15    Time 10 Minutes      NuStep   Level 2    SPM 72    Minutes 15    METs 2      Track   Minutes 12    METs 2.85             Social History   Tobacco Use  Smoking Status Former   Packs/day: 1.00   Years: 45.00   Additional pack years: 0.00   Total pack years: 45.00   Types: Cigarettes   Quit date: 10/27/2011   Years since quitting: 11.6  Smokeless Tobacco Never    Goals Met:  Exercise tolerated well No report of concerns or symptoms today Strength training completed today  Goals Unmet:  Not Applicable  Comments: Service time is from 1308 to 1508    Dr. Mechele Collin is Medical Director for Pulmonary Rehab at [facility].

## 2023-06-17 NOTE — Progress Notes (Signed)
Pulmonary Individual Treatment Plan  Patient Details  Name: BAYLIN CABAL MRN: [id number] Date of Birth: [birth year] Referring Provider:   Doristine Devoid Pulmonary Rehab Walk Test from 06/03/2023 in [facility]  Referring Provider [name]       Initial Encounter Date:  Flowsheet Row Pulmonary Rehab Walk Test from 06/03/2023 in [facility]  Date 06/03/23       Visit Diagnosis: Stage 2 moderate COPD by GOLD classification (HCC)  Patient's Home Medications on Admission:   Current Outpatient Medications:    acetaminophen (TYLENOL) 500 MG tablet, Take 1,000 mg by mouth in the morning and at bedtime., Disp: , Rfl:    albuterol (PROVENTIL HFA;VENTOLIN HFA) 108 (90 BASE) MCG/ACT inhaler, Inhale 2 puffs into the lungs every 6 (six) hours as needed. For shortness of breath, Disp: , Rfl:    albuterol (PROVENTIL) (2.5 MG/3ML) 0.083% nebulizer solution, Take 3 mLs (2.5 mg total) by nebulization every 6 (six) hours as needed. Breathing, Disp: 75 mL, Rfl: 3   amLODipine (NORVASC) 10 MG tablet, Take 10 mg by mouth daily., Disp: , Rfl:    atorvastatin (LIPITOR) 10 MG tablet, Take 10 mg by mouth daily., Disp: , Rfl:    Calcium Carbonate-Vit D-Min (CALCIUM 1200 PO), Take 1 tablet by mouth daily., Disp: , Rfl:    Cholecalciferol (VITAMIN D3) 50 MCG (2000 UT) TABS, Take by mouth., Disp: , Rfl:    Cod Liver Oil CAPS, Take 1 capsule by mouth daily., Disp: , Rfl:    Cyanocobalamin (VITAMIN B 12 PO), Take 250 mcg by mouth daily., Disp: , Rfl:    Dupilumab (DUPIXENT) 300 MG/2ML SOPN, Inject 300 mg into the skin every 14 (fourteen) days. **already on maintenance**, Disp: 12 mL, Rfl: 1   fluticasone-salmeterol (ADVAIR) 500-50 MCG/ACT AEPB, INHALE 1 PUFF INTO THE LUNGS IN THE MORNING AND AT BEDTIME., Disp: 180 each, Rfl: 3   hydrochlorothiazide (HYDRODIURIL) 25 MG tablet, Take 25 mg by mouth daily.,  Disp: , Rfl:    loratadine (CLARITIN) 10 MG tablet, Take 10 mg by mouth daily. (Patient not taking: Reported on 06/03/2023), Disp: , Rfl:    montelukast (SINGULAIR) 10 MG tablet, TAKE 1 TABLET (10 MG TOTAL) BY MOUTH AT BEDTIME., Disp: 90 tablet, Rfl: 3   montelukast (SINGULAIR) 10 MG tablet, 1 tablet, Disp: , Rfl:    montelukast (SINGULAIR) 10 MG tablet, TAKE 1 TABLET AT BEDTIME (NEED MD APPOINTMENT), Disp: 90 tablet, Rfl: 3   Omega-3 Fatty Acids (FISH OIL) 1200 MG CAPS, Take 1 capsule by mouth daily., Disp: , Rfl:    pantoprazole (PROTONIX) 40 MG tablet, 1 tablet, Disp: , Rfl:    pantoprazole (PROTONIX) 40 MG tablet, TAKE 1 TABLET (40 MG TOTAL) BY MOUTH DAILY., Disp: 90 tablet, Rfl: 1   pantoprazole (PROTONIX) 40 MG tablet, TAKE 1 TABLET (40 MG TOTAL) BY MOUTH DAILY., Disp: 90 tablet, Rfl: 3   Peak Flow Meter DEVI, Dispense peak flow meter for asthma, Disp: 1 each, Rfl: 0   RESTASIS 0.05 % ophthalmic emulsion, 1 drop 2 (two) times daily., Disp: , Rfl:    Spacer/Aero-Holding Chambers DEVI, Use with inhalers as directed., Disp: 1 each, Rfl: 1   SPIRIVA RESPIMAT 2.5 MCG/ACT AERS, , Disp: , Rfl:    Tiotropium Bromide Monohydrate (SPIRIVA RESPIMAT) 2.5 MCG/ACT AERS, Inhale 2 each into the lungs daily., Disp: , Rfl:    traMADol (ULTRAM) 50 MG tablet,  Take 50 mg by mouth every 12 (twelve) hours as needed for moderate pain. (Patient not taking: Reported on 06/03/2023), Disp: , Rfl:    TURMERIC CURCUMIN PO, Take 1 capsule by mouth daily., Disp: , Rfl:   Past Medical History: Past Medical History:  Diagnosis Date   Arthritis    right knee, neck   Asthma    Chronic kidney disease    COPD (chronic obstructive pulmonary disease) (HCC)    Hypertension    Mouth trouble    infection from tooth that was pulled approx. mid Mar. 2013    Tobacco Use: Social History   Tobacco Use  Smoking Status Former   Packs/day: 1.00   Years: 45.00   Additional pack years: 0.00   Total pack years: 45.00   Types:  Cigarettes   Quit date: 10/27/2011   Years since quitting: 11.6  Smokeless Tobacco Never    Labs: Review Flowsheet       Latest Ref Rng & Units 10/21/2011 11/26/2011 03/10/2012  Labs for ITP Cardiac and Pulmonary Rehab  PH, Arterial 7.350 - 7.400 - 7.407  7.411   PCO2 arterial 35.0 - 45.0 mmHg - 38.2  41.3   Bicarbonate 20.0 - 24.0 mEq/L - 23.6  25.7   TCO2 0 - 100 mmol/L 26  21.0  23.0   Acid-base deficit 0.0 - 2.0 mmol/L - 0.3  -  O2 Saturation % - 96.6  92.0     Capillary Blood Glucose: No results found for: "GLUCAP"   Pulmonary Assessment Scores:  Pulmonary Assessment Scores     Row Name 06/03/23 1333         ADL UCSD   ADL Phase Entry     SOB Score total 56       CAT Score   CAT Score 25       mMRC Score   mMRC Score 3             UCSD: Self-administered rating of dyspnea associated with activities of daily living (ADLs) 6-point scale (0 = "not at all" to 5 = "maximal or unable to do because of breathlessness")  Scoring Scores range from 0 to 120.  Minimally important difference is 5 units  CAT: CAT can identify the health impairment of COPD patients and is better correlated with disease progression.  CAT has a scoring range of zero to 40. The CAT score is classified into four groups of low (less than 10), medium (10 - 20), high (21-30) and very high (31-40) based on the impact level of disease on health status. A CAT score over 10 suggests significant symptoms.  A worsening CAT score could be explained by an exacerbation, poor medication adherence, poor inhaler technique, or progression of COPD or comorbid conditions.  CAT MCID is 2 points  mMRC: mMRC (Modified Medical Research Council) Dyspnea Scale is used to assess the degree of baseline functional disability in patients of respiratory disease due to dyspnea. No minimal important difference is established. A decrease in score of 1 point or greater is considered a positive change.   Pulmonary Function  Assessment:  Pulmonary Function Assessment - 06/03/23 1332       Breath   Bilateral Breath Sounds Clear    Shortness of Breath Yes             Exercise Target Goals: Exercise Program Goal: Individual exercise prescription set using results from initial 6 min walk test and THRR while considering  patient's activity barriers and safety.  Exercise Prescription Goal: Initial exercise prescription builds to 30-45 minutes a day of aerobic activity, 2-3 days per week.  Home exercise guidelines will be given to patient during program as part of exercise prescription that the participant will acknowledge.  Activity Barriers & Risk Stratification:  Activity Barriers & Cardiac Risk Stratification - 06/03/23 1326       Activity Barriers & Cardiac Risk Stratification   Activity Barriers Deconditioning;Muscular Weakness;Shortness of Breath;Arthritis    Cardiac Risk Stratification Low             6 Minute Walk:  6 Minute Walk     Row Name 06/03/23 1421         6 Minute Walk   Phase Initial     Distance 812 feet     Walk Time 6 minutes     # of Rest Breaks 0     MPH 1.54     METS 1.56     RPE 11     Perceived Dyspnea  1     VO2 Peak 5.47     Symptoms Yes (comment)     Comments fatigue/SOB     Resting HR 57 bpm     Resting BP 114/60     Resting Oxygen Saturation  96 %     Exercise Oxygen Saturation  during 6 min walk 89 %     Max Ex. HR 88 bpm     Max Ex. BP 136/80     2 Minute Post BP 106/60       Interval HR   1 Minute HR 73     2 Minute HR 88     3 Minute HR 88     4 Minute HR 88     5 Minute HR 85     6 Minute HR 87     2 Minute Post HR 71     Interval Heart Rate? Yes       Interval Oxygen   Interval Oxygen? Yes     Baseline Oxygen Saturation % 96 %     1 Minute Oxygen Saturation % 93 %     1 Minute Liters of Oxygen 0 L     2 Minute Oxygen Saturation % 91 %     2 Minute Liters of Oxygen 0 L     3 Minute Oxygen Saturation % 91 %     3 Minute Liters  of Oxygen 0 L     4 Minute Oxygen Saturation % 90 %     4 Minute Liters of Oxygen 0 L     5 Minute Oxygen Saturation % 93 %     5 Minute Liters of Oxygen 0 L     6 Minute Oxygen Saturation % 89 %     6 Minute Liters of Oxygen 0 L     2 Minute Post Oxygen Saturation % 97 %     2 Minute Post Liters of Oxygen 0 L              Oxygen Initial Assessment:  Oxygen Initial Assessment - 06/03/23 1328       Home Oxygen   Home Oxygen Device None    Sleep Oxygen Prescription None    Home Exercise Oxygen Prescription None    Home Resting Oxygen Prescription None      Initial 6 min Walk   Oxygen Used None      Program Oxygen Prescription   Program Oxygen Prescription None  Intervention   Short Term Goals To learn and understand importance of maintaining oxygen saturations>88%;To learn and demonstrate proper use of respiratory medications;To learn and understand importance of monitoring SPO2 with pulse oximeter and demonstrate accurate use of the pulse oximeter.;To learn and demonstrate proper pursed lip breathing techniques or other breathing techniques.     Long  Term Goals Maintenance of O2 saturations>88%;Compliance with respiratory medication;Verbalizes importance of monitoring SPO2 with pulse oximeter and return demonstration;Exhibits proper breathing techniques, such as pursed lip breathing or other method taught during program session;Demonstrates proper use of MDI's             Oxygen Re-Evaluation:  Oxygen Re-Evaluation     Row Name 06/10/23 1528             Program Oxygen Prescription   Program Oxygen Prescription None         Home Oxygen   Home Oxygen Device None       Sleep Oxygen Prescription None       Home Exercise Oxygen Prescription None       Home Resting Oxygen Prescription None         Goals/Expected Outcomes   Short Term Goals To learn and understand importance of maintaining oxygen saturations>88%;To learn and demonstrate proper use of  respiratory medications;To learn and understand importance of monitoring SPO2 with pulse oximeter and demonstrate accurate use of the pulse oximeter.;To learn and demonstrate proper pursed lip breathing techniques or other breathing techniques.        Long  Term Goals Maintenance of O2 saturations>88%;Compliance with respiratory medication;Verbalizes importance of monitoring SPO2 with pulse oximeter and return demonstration;Exhibits proper breathing techniques, such as pursed lip breathing or other method taught during program session;Demonstrates proper use of MDI's       Goals/Expected Outcomes Compliance and understanding of oxygen saturations monitoring and breathing techniques to decrease shortness of breath.                Oxygen Discharge (Final Oxygen Re-Evaluation):  Oxygen Re-Evaluation - 06/10/23 1528       Program Oxygen Prescription   Program Oxygen Prescription None      Home Oxygen   Home Oxygen Device None    Sleep Oxygen Prescription None    Home Exercise Oxygen Prescription None    Home Resting Oxygen Prescription None      Goals/Expected Outcomes   Short Term Goals To learn and understand importance of maintaining oxygen saturations>88%;To learn and demonstrate proper use of respiratory medications;To learn and understand importance of monitoring SPO2 with pulse oximeter and demonstrate accurate use of the pulse oximeter.;To learn and demonstrate proper pursed lip breathing techniques or other breathing techniques.     Long  Term Goals Maintenance of O2 saturations>88%;Compliance with respiratory medication;Verbalizes importance of monitoring SPO2 with pulse oximeter and return demonstration;Exhibits proper breathing techniques, such as pursed lip breathing or other method taught during program session;Demonstrates proper use of MDI's    Goals/Expected Outcomes Compliance and understanding of oxygen saturations monitoring and breathing techniques to decrease shortness of  breath.             Initial Exercise Prescription:  Initial Exercise Prescription - 06/03/23 1400       Date of Initial Exercise RX and Referring Provider   Date 06/03/23    Referring Provider [name]    Expected Discharge Date 08/27/23      NuStep   Level 2    SPM 60    Minutes 15  METs 1.5      Track   Minutes 15    METs 1.5      Prescription Details   Frequency (times per week) 2    Duration Progress to 30 minutes of continuous aerobic without signs/symptoms of physical distress      Intensity   THRR 40-80% of Max Heartrate 58-117    Ratings of Perceived Exertion 11-13    Perceived Dyspnea 0-4      Progression   Progression Continue progressive overload as per policy without signs/symptoms or physical distress.      Resistance Training   Training Prescription Yes    Weight blue bands    Reps 10-15             Perform Capillary Blood Glucose checks as needed.  Exercise Prescription Changes:   Exercise Prescription Changes     Row Name 06/16/23 1500             Response to Exercise   Blood Pressure (Admit) 100/62       Blood Pressure (Exercise) 126/60       Blood Pressure (Exit) 110/70       Heart Rate (Admit) 67 bpm       Heart Rate (Exercise) 82 bpm       Heart Rate (Exit) 63 bpm       Oxygen Saturation (Admit) 97 %       Oxygen Saturation (Exercise) 95 %       Oxygen Saturation (Exit) 97 %       Rating of Perceived Exertion (Exercise) 12       Perceived Dyspnea (Exercise) 1       Duration Progress to 30 minutes of  aerobic without signs/symptoms of physical distress       Intensity THRR unchanged         Progression   Progression Continue to progress workloads to maintain intensity without signs/symptoms of physical distress.         Resistance Training   Training Prescription Yes       Weight blue bands       Reps 10-15       Time 10 Minutes         NuStep   Level 2       SPM 72       Minutes 15       METs 2          Track   Minutes 12       METs 2.85                Exercise Comments:   Exercise Comments     Row Name 06/09/23 1610           Exercise Comments Pt completed first day of group exercise. She exercised on the recumbent stepper for 15 min, level 2, METs 1.5. Pt then walked on the track for 11 laps, METs 2.69, for 15 min. Tolerated well. Discussed METs. She performed leg extentions instead of squats due to knee pain.                Exercise Goals and Review:   Exercise Goals     Row Name 06/03/23 1327 06/10/23 1525           Exercise Goals   Increase Physical Activity Yes Yes      Intervention Provide advice, education, support and counseling about physical activity/exercise needs.;Develop an individualized exercise prescription for aerobic and  resistive training based on initial evaluation findings, risk stratification, comorbidities and participant's personal goals. Provide advice, education, support and counseling about physical activity/exercise needs.;Develop an individualized exercise prescription for aerobic and resistive training based on initial evaluation findings, risk stratification, comorbidities and participant's personal goals.      Expected Outcomes Short Term: Attend rehab on a regular basis to increase amount of physical activity.;Long Term: Exercising regularly at least 3-5 days a week.;Long Term: Add in home exercise to make exercise part of routine and to increase amount of physical activity. Short Term: Attend rehab on a regular basis to increase amount of physical activity.;Long Term: Exercising regularly at least 3-5 days a week.;Long Term: Add in home exercise to make exercise part of routine and to increase amount of physical activity.      Increase Strength and Stamina Yes Yes      Intervention Provide advice, education, support and counseling about physical activity/exercise needs.;Develop an individualized exercise prescription for aerobic and  resistive training based on initial evaluation findings, risk stratification, comorbidities and participant's personal goals. Provide advice, education, support and counseling about physical activity/exercise needs.;Develop an individualized exercise prescription for aerobic and resistive training based on initial evaluation findings, risk stratification, comorbidities and participant's personal goals.      Expected Outcomes Short Term: Increase workloads from initial exercise prescription for resistance, speed, and METs.;Short Term: Perform resistance training exercises routinely during rehab and add in resistance training at home;Long Term: Improve cardiorespiratory fitness, muscular endurance and strength as measured by increased METs and functional capacity ( ) Short Term: Increase workloads from initial exercise prescription for resistance, speed, and METs.;Short Term: Perform resistance training exercises routinely during rehab and add in resistance training at home;Long Term: Improve cardiorespiratory fitness, muscular endurance and strength as measured by increased METs and functional capacity ( )      Able to understand and use rate of perceived exertion (RPE) scale Yes Yes      Intervention Provide education and explanation on how to use RPE scale Provide education and explanation on how to use RPE scale      Expected Outcomes Short Term: Able to use RPE daily in rehab to express subjective intensity level;Long Term:  Able to use RPE to guide intensity level when exercising independently Short Term: Able to use RPE daily in rehab to express subjective intensity level;Long Term:  Able to use RPE to guide intensity level when exercising independently      Able to understand and use Dyspnea scale Yes Yes      Intervention Provide education and explanation on how to use Dyspnea scale Provide education and explanation on how to use Dyspnea scale      Expected Outcomes Short Term: Able to use  Dyspnea scale daily in rehab to express subjective sense of shortness of breath during exertion;Long Term: Able to use Dyspnea scale to guide intensity level when exercising independently Short Term: Able to use Dyspnea scale daily in rehab to express subjective sense of shortness of breath during exertion;Long Term: Able to use Dyspnea scale to guide intensity level when exercising independently      Knowledge and understanding of Target Heart Rate Range (THRR) Yes Yes      Intervention Provide education and explanation of THRR including how the numbers were predicted and where they are located for reference Provide education and explanation of THRR including how the numbers were predicted and where they are located for reference      Expected Outcomes Short Term: Able  to state/look up THRR;Long Term: Able to use THRR to govern intensity when exercising independently;Short Term: Able to use daily as guideline for intensity in rehab Short Term: Able to state/look up THRR;Long Term: Able to use THRR to govern intensity when exercising independently;Short Term: Able to use daily as guideline for intensity in rehab      Understanding of Exercise Prescription Yes Yes      Intervention Provide education, explanation, and written materials on patient's individual exercise prescription Provide education, explanation, and written materials on patient's individual exercise prescription      Expected Outcomes Short Term: Able to explain program exercise prescription;Long Term: Able to explain home exercise prescription to exercise independently Short Term: Able to explain program exercise prescription;Long Term: Able to explain home exercise prescription to exercise independently               Exercise Goals Re-Evaluation :  Exercise Goals Re-Evaluation     Row Name 06/10/23 1525             Exercise Goal Re-Evaluation   Exercise Goals Review Increase Physical Activity;Able to understand and use  Dyspnea scale;Understanding of Exercise Prescription;Increase Strength and Stamina;Knowledge and understanding of Target Heart Rate Range (THRR);Able to understand and use rate of perceived exertion (RPE) scale       Comments Pt has completed one day of group exercise. She exercised on the recumbent stepper for 15 min, level 2, METs 1.5. Pt then walked on the track for 11 laps, METs 2.69, for 15 min. Tolerated well. She performed leg extentions instead of squats due to knee pain. Will progress as tolerated.       Expected Outcomes Through exercise at rehab and at home, the patient will decrease shortness of breath with daily activities and feel confident in carrying out an exercise regime at home.                Discharge Exercise Prescription (Final Exercise Prescription Changes):  Exercise Prescription Changes - 06/16/23 1500       Response to Exercise   Blood Pressure (Admit) 100/62    Blood Pressure (Exercise) 126/60    Blood Pressure (Exit) 110/70    Heart Rate (Admit) 67 bpm    Heart Rate (Exercise) 82 bpm    Heart Rate (Exit) 63 bpm    Oxygen Saturation (Admit) 97 %    Oxygen Saturation (Exercise) 95 %    Oxygen Saturation (Exit) 97 %    Rating of Perceived Exertion (Exercise) 12    Perceived Dyspnea (Exercise) 1    Duration Progress to 30 minutes of  aerobic without signs/symptoms of physical distress    Intensity THRR unchanged      Progression   Progression Continue to progress workloads to maintain intensity without signs/symptoms of physical distress.      Resistance Training   Training Prescription Yes    Weight blue bands    Reps 10-15    Time 10 Minutes      NuStep   Level 2    SPM 72    Minutes 15    METs 2      Track   Minutes 12    METs 2.85             Nutrition:  Target Goals: Understanding of nutrition guidelines, daily intake of sodium 1500mg , cholesterol 200mg , calories 30% from fat and 7% or less from saturated fats, daily to have 5 or  more servings of fruits and vegetables.  Biometrics:  Pre Biometrics - 06/03/23 1425       Pre Biometrics   Grip Strength 34 kg              Nutrition Therapy Plan and Nutrition Goals:  Nutrition Therapy & Goals - 06/09/23 1606       Nutrition Therapy   Diet Heart healthy diet    Drug/Food Interactions Statins/Certain Fruits      Personal Nutrition Goals   Nutrition Goal Patient to improve diet quality by using the plate method as a guide for meal planning to include lean protein/plant protein, fruits, vegetables, whole grains, nonfat dairy as part of a well-balanced diet.    Personal Goal #2 Patient to limit sodium to 2300mg  per day    Comments Josy works part-time. She reports no nutrition questions/concerns at this time. Patient will benefit from participation in pulmonary rehab for nutrition, exercise, and lifestyle modification.      Intervention Plan   Intervention Prescribe, educate and counsel regarding individualized specific dietary modifications aiming towards targeted core components such as weight, hypertension, lipid management, diabetes, heart failure and other comorbidities.    Expected Outcomes Short Term Goal: Understand basic principles of dietary content, such as calories, fat, sodium, cholesterol and nutrients.;Long Term Goal: Adherence to prescribed nutrition plan.             Nutrition Assessments:  Nutrition Assessments - 06/12/23 0810       Rate Your Plate Scores   Pre Score 66            MEDIFICTS Score Key: ?70 Need to make dietary changes  40-70 Heart Healthy Diet ? 40 Therapeutic Level Cholesterol Diet  Flowsheet Row PULMONARY REHAB CHRONIC OBSTRUCTIVE PULMONARY DISEASE from 06/09/2023 in [facility]  Picture Your Plate Total Score on Admission 66      Picture Your Plate Scores: <16 Unhealthy dietary pattern with much room for improvement. 41-50 Dietary pattern  unlikely to meet recommendations for good health and room for improvement. 51-60 More healthful dietary pattern, with some room for improvement.  >60 Healthy dietary pattern, although there may be some specific behaviors that could be improved.    Nutrition Goals Re-Evaluation:  Nutrition Goals Re-Evaluation     Row Name 06/09/23 1606             Goals   Current Weight 181 lb 14.1 oz (82.5 kg)       Comment labs not available from outside provider       Expected Outcome Yitta works part-time. She reports no nutrition questions/concerns at this time. Patient will benefit from participation in pulmonary rehab for nutrition, exercise, and lifestyle modification.                Nutrition Goals Discharge (Final Nutrition Goals Re-Evaluation):  Nutrition Goals Re-Evaluation - 06/09/23 1606       Goals   Current Weight 181 lb 14.1 oz (82.5 kg)    Comment labs not available from outside provider    Expected Outcome Declyn works part-time. She reports no nutrition questions/concerns at this time. Patient will benefit from participation in pulmonary rehab for nutrition, exercise, and lifestyle modification.             Psychosocial: Target Goals: Acknowledge presence or absence of significant depression and/or stress, maximize coping skills, provide positive support system. Participant is able to verbalize types and ability to use techniques and skills needed for reducing stress  and depression.  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 He has a brain injury and lives in an assisted living facility. Kijana washes his clothes weekly and goes with him to all of his appointments.      Family Dynamics   Good Support System?  Yes      Barriers   Psychosocial barriers to participate in program The patient should benefit from training in stress management and relaxation.      Screening Interventions   Interventions Encouraged to exercise;To provide support and resources with identified psychosocial needs    Expected Outcomes Short Term goal: Utilizing psychosocial counselor, staff and physician to assist with identification of specific Stressors or current issues interfering with healing process. Setting desired goal for each stressor or current issue identified.;Long Term Goal: Stressors or current issues are controlled or eliminated.             Quality of Life Scores:  Scores of 19 and below usually indicate a poorer quality of life in these areas.  A difference of  2-3 points is a clinically meaningful difference.  A difference of 2-3 points in the total score of the Quality of Life Index has been associated with significant improvement in overall quality of life, self-image, physical symptoms, and general health in studies assessing change in quality of life.  PHQ-9: Review Flowsheet       06/03/2023  Depression screen PHQ 2/9  Decreased Interest 0  Down, Depressed, Hopeless 0  PHQ - 2 Score 0  Altered sleeping 0  Tired, decreased energy 0  Change in appetite 0  Feeling bad or failure about yourself  0  Trouble concentrating 0  Moving slowly or fidgety/restless 0  Suicidal thoughts 0  PHQ-9 Score 0  Difficult doing work/chores Not difficult at all   Interpretation of Total Score  Total Score Depression Severity:  1-4 = Minimal depression, 5-9 = Mild depression, 10-14 = Moderate depression, 15-19 = Moderately severe depression, 20-27 = Severe depression   Psychosocial Evaluation and Intervention:  Psychosocial Evaluation - 06/03/23 1320       Psychosocial Evaluation & Interventions   Interventions Encouraged to exercise with the program and follow exercise prescription;Stress management  education    Comments Saisha expresses she has stress due to being her brothers healthcare power of attorney. He has a brain injury and lives in an assisted living facility. Mell washes his clothes weekly and goes with him to all of his appointments.    Expected Outcomes For Priyanka to participate in PR free of any psychosocial barriers or concerns    Continue Psychosocial Services  No Follow up required             Psychosocial Re-Evaluation:  Psychosocial Re-Evaluation     Row Name 06/12/23 1056             Psychosocial Re-Evaluation   Current issues with Current Stress Concerns       Comments No changes since orientation. Cannon has been to 1 class.       Expected Outcomes For Kenyata to continue to attend pulmonary rehab and to have a positive outlook and good coping skills to manage her stress.  Interventions Encouraged to attend Pulmonary Rehabilitation for the exercise;Stress management education;Relaxation education       Continue Psychosocial Services  Follow up required by staff                Psychosocial Discharge (Final Psychosocial Re-Evaluation):  Psychosocial Re-Evaluation - 06/12/23 1056       Psychosocial Re-Evaluation   Current issues with Current Stress Concerns    Comments No changes since orientation. Gwendoline has been to 1 class.    Expected Outcomes For Korbin to continue to attend pulmonary rehab and to have a positive outlook and good coping skills to manage her stress.    Interventions Encouraged to attend Pulmonary Rehabilitation for the exercise;Stress management education;Relaxation education    Continue Psychosocial Services  Follow up required by staff             Education: Education Goals: Education classes will be provided on a weekly basis, covering required topics. Participant will state understanding/return demonstration of topics presented.  Learning Barriers/Preferences:  Learning Barriers/Preferences - 06/03/23  1325       Learning Barriers/Preferences   Learning Barriers Sight    Learning Preferences Group Instruction;Skilled Demonstration             Education Topics: Introduction to Pulmonary Rehab Group instruction provided by PowerPoint, verbal discussion, and written material to support subject matter. Instructor reviews what Pulmonary Rehab is, the purpose of the program, and how patients are referred.     Know Your Numbers Group instruction that is supported by a PowerPoint presentation. Instructor discusses importance of knowing and understanding resting, exercise, and post-exercise oxygen saturation, heart rate, and blood pressure. Oxygen saturation, heart rate, blood pressure, rating of perceived exertion, and dyspnea are reviewed along with a normal range for these values.    Exercise for the Pulmonary Patient Group instruction that is supported by a PowerPoint presentation. Instructor discusses benefits of exercise, core components of exercise, frequency, duration, and intensity of an exercise routine, importance of utilizing pulse oximetry during exercise, safety while exercising, and options of places to exercise outside of rehab.       MET Level  Group instruction provided by PowerPoint, verbal discussion, and written material to support subject matter. Instructor reviews what METs are and how to increase METs.    Pulmonary Medications Verbally interactive group education provided by instructor with focus on inhaled medications and proper administration.   Anatomy and Physiology of the Respiratory System Group instruction provided by PowerPoint, verbal discussion, and written material to support subject matter. Instructor reviews respiratory cycle and anatomical components of the respiratory system and their functions. Instructor also reviews differences in obstructive and restrictive respiratory diseases with examples of each.    Oxygen Safety Group instruction  provided by PowerPoint, verbal discussion, and written material to support subject matter. There is an overview of "What is Oxygen" and "Why do we need it".  Instructor also reviews how to create a safe environment for oxygen use, the importance of using oxygen as prescribed, and the risks of noncompliance. There is a brief discussion on traveling with oxygen and resources the patient may utilize.   Oxygen Use Group instruction provided by PowerPoint, verbal discussion, and written material to discuss how supplemental oxygen is prescribed and different types of oxygen supply systems. Resources for more information are provided.    Breathing Techniques Group instruction that is supported by demonstration and informational handouts. Instructor discusses the benefits of pursed lip and diaphragmatic breathing and detailed demonstration  on how to perform both.     Risk Factor Reduction Group instruction that is supported by a PowerPoint presentation. Instructor discusses the definition of a risk factor, different risk factors for pulmonary disease, and how the heart and lungs work together.   MD Day A group question and answer session with a medical doctor that allows participants to ask questions that relate to their pulmonary disease state.   Nutrition for the Pulmonary Patient Group instruction provided by PowerPoint slides, verbal discussion, and written materials to support subject matter. The instructor gives an explanation and review of healthy diet recommendations, which includes a discussion on weight management, recommendations for fruit and vegetable consumption, as well as protein, fluid, caffeine, fiber, sodium, sugar, and alcohol. Tips for eating when patients are short of breath are discussed.    Other Education Group or individual verbal, written, or video instructions that support the educational goals of the pulmonary rehab program.    Knowledge Questionnaire Score:   Knowledge Questionnaire Score - 06/03/23 1334       Knowledge Questionnaire Score   Pre Score 17/18             Core Components/Risk Factors/Patient Goals at Admission:  Personal Goals and Risk Factors at Admission - 06/03/23 1325       Core Components/Risk Factors/Patient Goals on Admission    Weight Management Weight Loss    Improve shortness of breath with ADL's Yes    Intervention Provide education, individualized exercise plan and daily activity instruction to help decrease symptoms of SOB with activities of daily living.    Expected Outcomes Short Term: Improve cardiorespiratory fitness to achieve a reduction of symptoms when performing ADLs;Long Term: Be able to perform more ADLs without symptoms or delay the onset of symptoms    Increase knowledge of respiratory medications and ability to use respiratory devices properly  Yes    Intervention Provide education and demonstration as needed of appropriate use of medications, inhalers, and oxygen therapy.    Expected Outcomes Short Term: Achieves understanding of medications use. Understands that oxygen is a medication prescribed by physician. Demonstrates appropriate use of inhaler and oxygen therapy.;Long Term: Maintain appropriate use of medications, inhalers, and oxygen therapy.    Stress Yes    Intervention Offer individual and/or small group education and counseling on adjustment to heart disease, stress management and health-related lifestyle change. Teach and support self-help strategies.;Refer participants experiencing significant psychosocial distress to appropriate mental health specialists for further evaluation and treatment. When possible, include family members and significant others in education/counseling sessions.    Expected Outcomes Short Term: Participant demonstrates changes in health-related behavior, relaxation and other stress management skills, ability to obtain effective social support, and compliance with  psychotropic medications if prescribed.;Long Term: Emotional wellbeing is indicated by absence of clinically significant psychosocial distress or social isolation.             Core Components/Risk Factors/Patient Goals Review:   Goals and Risk Factor Review     Row Name 06/12/23 1058             Core Components/Risk Factors/Patient Goals Review   Personal Goals Review Weight Management/Obesity;Improve shortness of breath with ADL's;Develop more efficient breathing techniques such as purse lipped breathing and diaphragmatic breathing and practicing self-pacing with activity.;Increase knowledge of respiratory medications and ability to use respiratory devices properly.;Stress       Review Goal in progress for weight loss. Ellouise has completed 1 class and is working with our dietician.  Goal in progress on developing more efficient breathing techniques such as purse lipped breathing and diaphragmatic breathing; and practicing self-pacing with activity. Goal in progress on improving her shortness of breath with ADLs. Goal in progress for increasing her knowledge of respiratory medications and ability to use respiratory devices properly. Lanny has attended 1 class. Jaylaa will benefit from participation in PR for nutrition, education, exercise and lifestyle modification.       Expected Outcomes See admission goals                Core Components/Risk Factors/Patient Goals at Discharge (Final Review):   Goals and Risk Factor Review - 06/12/23 1058       Core Components/Risk Factors/Patient Goals Review   Personal Goals Review Weight Management/Obesity;Improve shortness of breath with ADL's;Develop more efficient breathing techniques such as purse lipped breathing and diaphragmatic breathing and practicing self-pacing with activity.;Increase knowledge of respiratory medications and ability to use respiratory devices properly.;Stress    Review Goal in progress for weight loss. Galaxy has  completed 1 class and is working with our dietician. Goal in progress on developing more efficient breathing techniques such as purse lipped breathing and diaphragmatic breathing; and practicing self-pacing with activity. Goal in progress on improving her shortness of breath with ADLs. Goal in progress for increasing her knowledge of respiratory medications and ability to use respiratory devices properly. Manju has attended 1 class. Jahnessa will benefit from participation in PR for nutrition, education, exercise and lifestyle modification.    Expected Outcomes See admission goals             ITP Comments: Pt is making expected progress toward Pulmonary Rehab goals after completing 2 sessions. Recommend continued exercise, life style modification, education, and utilization of breathing techniques to increase stamina and strength, while also decreasing shortness of breath with exertion.  Dr. Mechele Collin is Medical Director for Pulmonary Rehab at [facility].

## 2023-06-18 ENCOUNTER — Encounter (HOSPITAL_COMMUNITY)
Admission: RE | Admit: 2023-06-18 | Discharge: 2023-06-18 | Disposition: A | Discharge status: Home / Self Care | Payer: Medicare HMO | Source: Ambulatory Visit | Attending: Internal Medicine | Admitting: Internal Medicine

## 2023-06-18 DIAGNOSIS — J449 Chronic obstructive pulmonary disease, unspecified: Secondary | ICD-10-CM

## 2023-06-18 NOTE — Progress Notes (Signed)
Daily Session Note  Patient Details  Name: Barbara [name] MRN: [id number] Date of Birth: May 07, 1951 Referring Provider:   Doristine Devoid Pulmonary Rehab Walk Test from 06/03/2023 in [facility]  Referring Provider [name]       Encounter Date: 06/18/2023  Check In:  Session Check In - 06/18/23 1405       Check-In   Supervising physician immediately available to respond to emergencies [facility] MD immediately available    Physician(s) Alvira Monday, NP    Location [facility]    Staff Present Samantha Belarus, RD, Dutch Gray, RN, BSN;Randi Reeve BS, ACSM-CEP, Exercise Physiologist;Jetta Dan Humphreys BS, ACSM-CEP, Exercise Physiologist;Kelvin Sennett Earlene Plater, MS, ACSM-CEP, Exercise Physiologist;Casey Katrinka Blazing, RT    Virtual Visit No    Medication changes reported     No    Fall or balance concerns reported    No    Tobacco Cessation No Change    Warm-up and Cool-down Performed as group-led instruction    Resistance Training Performed Yes    VAD Patient? No    PAD/SET Patient? No      Pain Assessment   Currently in Pain? No/denies    Multiple Pain Sites No             Capillary Blood Glucose: No results found for this or any previous visit (from the past 24 hour(s)).    Social History   Tobacco Use  Smoking Status Former   Current packs/day: 0.00   Average packs/day: 1 pack/day for 45.0 years (45.0 ttl pk-yrs)   Types: Cigarettes   Start date: 10/26/1966   Quit date: 10/27/2011   Years since quitting: 11.6  Smokeless Tobacco Never    Goals Met:  Proper associated with RPD/PD & O2 Sat Exercise tolerated well No report of concerns or symptoms today Strength training completed today  Goals Unmet:  Not Applicable  Comments: Service time is from 1308 to 1450.    Dr. Mechele Collin is Medical Director for Pulmonary Rehab at [facility].

## 2023-06-23 ENCOUNTER — Encounter (HOSPITAL_COMMUNITY)
Admission: RE | Admit: 2023-06-23 | Discharge: 2023-06-23 | Disposition: A | Discharge status: Home / Self Care | Payer: Medicare HMO | Source: Ambulatory Visit | Attending: Internal Medicine | Admitting: Internal Medicine

## 2023-06-23 DIAGNOSIS — J449 Chronic obstructive pulmonary disease, unspecified: Secondary | ICD-10-CM | POA: Diagnosis not present

## 2023-06-23 NOTE — Progress Notes (Signed)
Daily Session Note  Patient Details  Name: Barbara [name] MRN: [id number] Date of Birth: [birth year] Referring Provider:   Doristine Devoid Pulmonary Rehab Walk Test from 06/03/2023 in [facility] for Heart, Vascular, & Lung Health  Referring Provider [name]       Encounter Date: 06/23/2023  Check In:  Session Check In - 06/23/23 1330       Check-In   Supervising physician immediately available to respond to emergencies [facility] MD immediately available    Physician(s) Edd Fabian, NP    [facility]    Staff Present Samantha Belarus, RD, Dutch Gray, RN, BSN;Randi Reeve BS, ACSM-CEP, Exercise Physiologist;Aidric Endicott Earlene Plater, MS, ACSM-CEP, Exercise Physiologist;Casey Katrinka Blazing, RT    Virtual Visit No    Medication changes reported     No    Fall or balance concerns reported    No    Tobacco Cessation No Change    Warm-up and Cool-down Performed as group-led instruction    Resistance Training Performed Yes    VAD Patient? No    PAD/SET Patient? No      Pain Assessment   Currently in Pain? No/denies    Multiple Pain Sites No             Capillary Blood Glucose: No results found for this or any previous visit (from the past 24 hour(s)).    Social History   Tobacco Use  Smoking Status Former   Current packs/day: 0.00   Average packs/day: 1 pack/day for 45.0 years (45.0 ttl pk-yrs)   Types: Cigarettes   Start date: 10/26/1966   Quit date: 10/27/2011   Years since quitting: 11.6  Smokeless Tobacco Never    Goals Met:  Proper associated with RPD/PD & O2 Sat Exercise tolerated well No report of concerns or symptoms today Strength training completed today  Goals Unmet:  Not Applicable  Comments: Service time is from 1310 to 1440.    Dr. Mechele Collin is Medical Director for Pulmonary Rehab at [facility].

## 2023-06-25 ENCOUNTER — Encounter (HOSPITAL_COMMUNITY)
Admission: RE | Admit: 2023-06-25 | Discharge: 2023-06-25 | Disposition: A | Discharge status: Home / Self Care | Payer: Medicare HMO | Source: Ambulatory Visit | Attending: Internal Medicine | Admitting: Internal Medicine

## 2023-06-25 DIAGNOSIS — J449 Chronic obstructive pulmonary disease, unspecified: Secondary | ICD-10-CM

## 2023-06-25 NOTE — Progress Notes (Signed)
Daily Session Note  Patient Details  Name: Barbara [name] MRN: [id number] Date of Birth: 1951/10/30 Referring Provider:   Doristine Devoid Pulmonary Rehab Walk Test from 06/03/2023 in [facility] for [facility], Vascular, & Lung [facility]  Referring Provider [name]       Encounter Date: 06/25/2023  Check In:  Session Check In - 06/25/23 1321       Check-In   Supervising physician immediately available to respond to emergencies [facility] MD immediately available    Physician(s) Bernadene Person, NP    [facility]    Staff Present Samantha Belarus, RD, Dutch Gray, RN, BSN;Randi Reeve BS, ACSM-CEP, Exercise Physiologist;Kaylee Earlene Plater, MS, ACSM-CEP, Exercise Physiologist;Matheu Ploeger Chester Holstein, MS, Exercise Physiologist    Virtual Visit No    Medication changes reported     No    Fall or balance concerns reported    No    Tobacco Cessation No Change    Warm-up and Cool-down Performed as group-led instruction    Resistance Training Performed Yes    VAD Patient? No    PAD/SET Patient? No      Pain Assessment   Currently in Pain? No/denies    Multiple Pain Sites No             Capillary Blood Glucose: No results found for this or any previous visit (from the past 24 hour(s)).    Social History   Tobacco Use  Smoking Status Former   Current packs/day: 0.00   Average packs/day: 1 pack/day for 45.0 years (45.0 ttl pk-yrs)   Types: Cigarettes   Start date: 10/26/1966   Quit date: 10/27/2011   Years since quitting: 11.6  Smokeless Tobacco Never    Goals Met:  Proper associated with RPD/PD & O2 Sat Independence with exercise equipment Exercise tolerated well No report of concerns or symptoms today Strength training completed today  Goals Unmet:  Not Applicable  Comments: Service time is from 1304 to 1440.    Dr. Mechele Collin is Medical Director for Pulmonary Rehab at [facility].

## 2023-06-30 ENCOUNTER — Encounter (HOSPITAL_COMMUNITY)
Admission: RE | Admit: 2023-06-30 | Discharge: 2023-06-30 | Disposition: A | Discharge status: Home / Self Care | Payer: Medicare HMO | Source: Ambulatory Visit | Attending: Internal Medicine | Admitting: Internal Medicine

## 2023-06-30 VITALS — Wt 180.1 lb

## 2023-06-30 DIAGNOSIS — J449 Chronic obstructive pulmonary disease, unspecified: Secondary | ICD-10-CM

## 2023-06-30 NOTE — Progress Notes (Signed)
Daily Session Note  Patient Details  Name: Barbara [name] MRN: [id number] Date of Birth: 1951/11/26 Referring Provider:   Doristine Devoid Pulmonary Rehab Walk Test from 06/03/2023 in [facility] for Heart, Vascular, & Lung Health  Referring Provider [name]       Encounter Date: 06/30/2023  Check In:  Session Check In - 06/30/23 1458       Check-In   Supervising physician immediately available to respond to emergencies [facility] MD immediately available    Physician(s) Bernadene Person, NP    [facility]    Staff Present Samantha Belarus, RD, Dutch Gray, RN, Zachery Conch, MS, ACSM-CEP, Exercise Physiologist;Kaylee Earlene Plater, MS, ACSM-CEP, Exercise Physiologist;Anesia Blackwell Katrinka Blazing, RT    Virtual Visit No    Medication changes reported     No    Fall or balance concerns reported    No    Tobacco Cessation No Change    Warm-up and Cool-down Performed as group-led instruction    Resistance Training Performed Yes    VAD Patient? No    PAD/SET Patient? No      Pain Assessment   Currently in Pain? No/denies    Multiple Pain Sites No             Capillary Blood Glucose: No results found for this or any previous visit (from the past 24 hour(s)).   Exercise Prescription Changes - 06/30/23 1400       Response to Exercise   Blood Pressure (Admit) 116/64    Blood Pressure (Exercise) 130/72    Blood Pressure (Exit) 102/70    Heart Rate (Admit) 57 bpm    Heart Rate (Exercise) 87 bpm    Heart Rate (Exit) 64 bpm    Oxygen Saturation (Admit) 96 %    Oxygen Saturation (Exercise) 96 %    Oxygen Saturation (Exit) 95 %    Rating of Perceived Exertion (Exercise) 13    Perceived Dyspnea (Exercise) 2    Duration Continue with 30 min of aerobic exercise without signs/symptoms of physical distress.    Intensity THRR unchanged      Progression   Progression Continue to progress workloads to maintain intensity without signs/symptoms of physical  distress.      Resistance Training   Training Prescription Yes    Weight blue bands    Reps 10-15    Time 10 Minutes      NuStep   Level 5    Minutes 15    METs 2.5      Track   Laps 12    Minutes 15    METs 2.85             Social History   Tobacco Use  Smoking Status Former   Current packs/day: 0.00   Average packs/day: 1 pack/day for 45.0 years (45.0 ttl pk-yrs)   Types: Cigarettes   Start date: 10/26/1966   Quit date: 10/27/2011   Years since quitting: 11.6  Smokeless Tobacco Never    Goals Met:  Proper associated with RPD/PD & O2 Sat Independence with exercise equipment Exercise tolerated well No report of concerns or symptoms today Strength training completed today  Goals Unmet:  Not Applicable  Comments: Service time is from 1309 to 1445.    Dr. Mechele Collin is Medical Director for Pulmonary Rehab at [facility].

## 2023-07-02 ENCOUNTER — Encounter (HOSPITAL_COMMUNITY)
Admission: RE | Admit: 2023-07-02 | Discharge: 2023-07-02 | Disposition: A | Discharge status: Home / Self Care | Payer: Medicare HMO | Source: Ambulatory Visit | Attending: Internal Medicine | Admitting: Internal Medicine

## 2023-07-02 DIAGNOSIS — J449 Chronic obstructive pulmonary disease, unspecified: Secondary | ICD-10-CM | POA: Diagnosis not present

## 2023-07-02 NOTE — Progress Notes (Signed)
Daily Session Note  Patient Details  Name: Barbara [name] MRN: [id number] Date of Birth: Dec 27, 1950 Referring Provider:   Doristine Devoid Pulmonary Rehab Walk Test from 06/03/2023 in [facility], Vascular, & Lung [facility]  Referring Provider [name]       Encounter Date: 07/02/2023  Check In:  Session Check In - 07/02/23 1410       Check-In   Supervising physician immediately available to respond to emergencies [facility] MD immediately available    Physician(s) Eligha Bridegroom, NP    [facility]    Staff Present Essie Hart, RN, Doris Cheadle, MS, ACSM-CEP, Exercise Physiologist;Janese Radabaugh Katrinka Blazing, RT    Virtual Visit No    Medication changes reported     No    Fall or balance concerns reported    No    Tobacco Cessation No Change    Warm-up and Cool-down Performed as group-led instruction    Resistance Training Performed Yes    VAD Patient? No      Pain Assessment   Currently in Pain? No/denies    Multiple Pain Sites No             Capillary Blood Glucose: No results found for this or any previous visit (from the past 24 hour(s)).    Social History   Tobacco Use  Smoking Status Former   Current packs/day: 0.00   Average packs/day: 1 pack/day for 45.0 years (45.0 ttl pk-yrs)   Types: Cigarettes   Start date: 10/26/1966   Quit date: 10/27/2011   Years since quitting: 11.6  Smokeless Tobacco Never    Goals Met:  Proper associated with RPD/PD & O2 Sat Independence with exercise equipment Exercise tolerated well No report of concerns or symptoms today Strength training completed today  Goals Unmet:  Not Applicable  Comments: Service time is from 1307 to 1432.    Dr. Mechele Collin is Medical Director for Pulmonary Rehab at [facility].

## 2023-07-07 ENCOUNTER — Encounter (HOSPITAL_COMMUNITY)
Admission: RE | Admit: 2023-07-07 | Discharge: 2023-07-07 | Disposition: A | Discharge status: Home / Self Care | Payer: Medicare HMO | Source: Ambulatory Visit | Attending: Internal Medicine | Admitting: Internal Medicine

## 2023-07-07 DIAGNOSIS — J449 Chronic obstructive pulmonary disease, unspecified: Secondary | ICD-10-CM

## 2023-07-07 NOTE — Progress Notes (Signed)
Daily Session Note  Patient Details  Name: Barbara [name] MRN: [id number] Date of Birth: [birth year] Referring Provider:   Doristine Devoid Pulmonary Rehab Walk Test from 06/03/2023 in [facility]  Referring Provider [name]       Encounter Date: 07/07/2023  Check In:  Session Check In - 07/07/23 1358       Check-In   Supervising physician immediately available to respond to emergencies [facility] MD immediately available    Physician(s) Robin Searing, NP    Location [facility]    Staff Present Essie Hart, RN, BSN;Berklie Dethlefs Katrinka Blazing, RT;Randi [facility] BS, ACSM-CEP, Exercise Physiologist;David [place], MS, ACSM-CEP, CCRP, Exercise Physiologist    Virtual Visit No    Medication changes reported     No    Fall or balance concerns reported    No    Tobacco Cessation No Change    Warm-up and Cool-down Performed as group-led instruction    Resistance Training Performed Yes    VAD Patient? No    PAD/SET Patient? No      Pain Assessment   Currently in Pain? No/denies    Multiple Pain Sites No             Capillary Blood Glucose: No results found for this or any previous visit (from the past 24 hour(s)).    Social History   Tobacco Use  Smoking Status Former   Current packs/day: 0.00   Average packs/day: 1 pack/day for 45.0 years (45.0 ttl pk-yrs)   Types: Cigarettes   Start date: 10/26/1966   Quit date: 10/27/2011   Years since quitting: 11.7  Smokeless Tobacco Never    Goals Met:  Proper associated with RPD/PD & O2 Sat Independence with exercise equipment Exercise tolerated well No report of concerns or symptoms today Strength training completed today  Goals Unmet:  Not Applicable  Comments: Service time is from 1306 to 1436.    Dr. Mechele Collin is Medical Director for Pulmonary Rehab at [facility].

## 2023-07-09 ENCOUNTER — Encounter (HOSPITAL_COMMUNITY)
Admission: RE | Admit: 2023-07-09 | Discharge: 2023-07-09 | Disposition: A | Discharge status: Home / Self Care | Payer: Medicare HMO | Source: Ambulatory Visit | Attending: Internal Medicine | Admitting: Internal Medicine

## 2023-07-09 DIAGNOSIS — J449 Chronic obstructive pulmonary disease, unspecified: Secondary | ICD-10-CM | POA: Insufficient documentation

## 2023-07-09 DIAGNOSIS — Z87891 Personal history of nicotine dependence: Secondary | ICD-10-CM | POA: Insufficient documentation

## 2023-07-09 NOTE — Progress Notes (Signed)
Daily Session Note  Patient Details  Name: Barbara [name] MRN: [id number] Date of Birth: July 31, 1951 Referring Provider:   Doristine Devoid Pulmonary Rehab Walk Test from 06/03/2023 in [facility]  Referring Provider [name]       Encounter Date: 07/09/2023  Check In:  Session Check In - 07/09/23 1411       Check-In   Supervising physician immediately available to respond to emergencies [facility] MD immediately available    Physician(s) Carlyon Shadow, NP    [facility]    Staff Present Samantha Belarus, RD, LDN;Randi Dionisio Paschal, ACSM-CEP, Exercise Physiologist;Mary [place], RN, BSN;Torey Reinard, RT;David [place], MS, ACSM-CEP, CCRP, Exercise Physiologist    Virtual Visit No    Medication changes reported     No    Fall or balance concerns reported    No    Tobacco Cessation No Change    Warm-up and Cool-down Performed as group-led instruction    Resistance Training Performed Yes    VAD Patient? No    PAD/SET Patient? No      Pain Assessment   Currently in Pain? No/denies    Multiple Pain Sites No             Capillary Blood Glucose: No results found for this or any previous visit (from the past 24 hour(s)).    Social History   Tobacco Use  Smoking Status Former   Current packs/day: 0.00   Average packs/day: 1 pack/day for 45.0 years (45.0 ttl pk-yrs)   Types: Cigarettes   Start date: 10/26/1966   Quit date: 10/27/2011   Years since quitting: 11.7  Smokeless Tobacco Never    Goals Met:  Proper associated with RPD/PD & O2 Sat Independence with exercise equipment Exercise tolerated well No report of concerns or symptoms today Strength training completed today  Goals Unmet:  Not Applicable  Comments: Service time is from 1310 to 1450.    Dr. Mechele Collin is Medical Director for Pulmonary Rehab at [facility].

## 2023-07-13 ENCOUNTER — Ambulatory Visit
Admission: RE | Admit: 2023-07-13 | Discharge: 2023-07-13 | Disposition: A | Discharge status: Home / Self Care | Payer: Medicare HMO | Source: Ambulatory Visit | Attending: Family Medicine | Admitting: Family Medicine

## 2023-07-13 DIAGNOSIS — M8580 Other specified disorders of bone density and structure, unspecified site: Secondary | ICD-10-CM

## 2023-07-13 DIAGNOSIS — Z1231 Encounter for screening mammogram for malignant neoplasm of breast: Secondary | ICD-10-CM

## 2023-07-13 DIAGNOSIS — E349 Endocrine disorder, unspecified: Secondary | ICD-10-CM | POA: Diagnosis not present

## 2023-07-13 DIAGNOSIS — M8588 Other specified disorders of bone density and structure, other site: Secondary | ICD-10-CM | POA: Diagnosis not present

## 2023-07-13 DIAGNOSIS — N958 Other specified menopausal and perimenopausal disorders: Secondary | ICD-10-CM | POA: Diagnosis not present

## 2023-07-14 ENCOUNTER — Encounter (HOSPITAL_COMMUNITY)
Admission: RE | Admit: 2023-07-14 | Discharge: 2023-07-14 | Disposition: A | Discharge status: Home / Self Care | Payer: Medicare HMO | Source: Ambulatory Visit | Attending: Internal Medicine | Admitting: Internal Medicine

## 2023-07-14 VITALS — Wt 178.6 lb

## 2023-07-14 DIAGNOSIS — J449 Chronic obstructive pulmonary disease, unspecified: Secondary | ICD-10-CM | POA: Diagnosis not present

## 2023-07-14 DIAGNOSIS — Z87891 Personal history of nicotine dependence: Secondary | ICD-10-CM | POA: Diagnosis not present

## 2023-07-14 NOTE — Progress Notes (Signed)
Daily Session Note  Patient Details  Name: Barbara [name] MRN: [id number] Date of Birth: 1951-07-06 Referring Provider:   Doristine Devoid Pulmonary Rehab Walk Test from 06/03/2023 in [facility]  Referring Provider [name]       Encounter Date: 07/14/2023  Check In:  Session Check In - 07/14/23 1353       Check-In   Supervising physician immediately available to respond to emergencies [facility] MD immediately available    Physician(s) Carlyon Shadow, NP    [facility]    Staff Present Elissa Lovett BS, ACSM-CEP, Exercise Physiologist;Samantha Belarus, RD, Rexene Agent, MS, ACSM-CEP, Exercise Physiologist;Mary Gerre Scull, RN, BSN;Johnny Hale Bogus, MS, Exercise Physiologist; Katrinka Blazing, RT    Virtual Visit No    Medication changes reported     No    Fall or balance concerns reported    No    Tobacco Cessation No Change    Warm-up and Cool-down Performed as group-led instruction    Resistance Training Performed Yes    VAD Patient? No    PAD/SET Patient? No      Pain Assessment   Currently in Pain? No/denies    Multiple Pain Sites No             Capillary Blood Glucose: No results found for this or any previous visit (from the past 24 hour(s)).   Exercise Prescription Changes - 07/14/23 1400       Response to Exercise   Blood Pressure (Admit) 112/66    Blood Pressure (Exercise) 140/70    Blood Pressure (Exit) 100/66    Heart Rate (Admit) 54 bpm    Heart Rate (Exercise) 65 bpm    Heart Rate (Exit) 54 bpm    Oxygen Saturation (Admit) 96 %    Oxygen Saturation (Exercise) 97 %    Oxygen Saturation (Exit) 97 %    Rating of Perceived Exertion (Exercise) 11    Perceived Dyspnea (Exercise) 1    Duration Continue with 30 min of aerobic exercise without signs/symptoms of physical distress.    Intensity THRR unchanged      Progression   Progression Continue to progress workloads to maintain  intensity without signs/symptoms of physical distress.      Resistance Training   Training Prescription Yes    Weight blue bands    Reps 10-15    Time 10 Minutes      NuStep   Level 5    Minutes 15    METs 2.8      Track   Laps 12    Minutes 15    METs 2.85             Social History   Tobacco Use  Smoking Status Former   Current packs/day: 0.00   Average packs/day: 1 pack/day for 45.0 years (45.0 ttl pk-yrs)   Types: Cigarettes   Start date: 10/26/1966   Quit date: 10/27/2011   Years since quitting: 11.7  Smokeless Tobacco Never    Goals Met:  Proper associated with RPD/PD & O2 Sat Independence with exercise equipment Exercise tolerated well No report of concerns or symptoms today Strength training completed today  Goals Unmet:  Not Applicable  Comments: Service time is from 1304 to 1445.    Dr. Mechele Collin is Medical Director for Pulmonary Rehab at [facility].

## 2023-07-15 NOTE — Progress Notes (Signed)
Pulmonary Individual Treatment Plan  Patient Details  Name: Barbara [name] MRN: [id number] Date of Birth: [birth year] Referring Provider:   Doristine Devoid Pulmonary Rehab Walk Test from 06/03/2023 in [facility]  Referring Provider [name]       Initial Encounter Date:  Flowsheet Row Pulmonary Rehab Walk Test from 06/03/2023 in [facility]  Date 06/03/23       Visit Diagnosis: Stage 2 moderate COPD by GOLD classification (HCC)  Patient's Home Medications on Admission:   Current Outpatient Medications:    acetaminophen (TYLENOL) 500 MG tablet, Take 1,000 mg by mouth in the morning and at bedtime., Disp: , Rfl:    albuterol (PROVENTIL HFA;VENTOLIN HFA) 108 (90 BASE) MCG/ACT inhaler, Inhale 2 puffs into the lungs every 6 (six) hours as needed. For shortness of breath, Disp: , Rfl:    albuterol (PROVENTIL) (2.5 MG/3ML) 0.083% nebulizer solution, Take 3 mLs (2.5 mg total) by nebulization every 6 (six) hours as needed. Breathing, Disp: 75 mL, Rfl: 3   amLODipine (NORVASC) 10 MG tablet, Take 10 mg by mouth daily., Disp: , Rfl:    atorvastatin (LIPITOR) 10 MG tablet, Take 10 mg by mouth daily., Disp: , Rfl:    Calcium Carbonate-Vit D-Min (CALCIUM 1200 PO), Take 1 tablet by mouth daily., Disp: , Rfl:    Cholecalciferol (VITAMIN D3) 50 MCG (2000 UT) TABS, Take by mouth., Disp: , Rfl:    Cod Liver Oil CAPS, Take 1 capsule by mouth daily., Disp: , Rfl:    Cyanocobalamin (VITAMIN B 12 PO), Take 250 mcg by mouth daily., Disp: , Rfl:    Dupilumab (DUPIXENT) 300 MG/2ML SOPN, Inject 300 mg into the skin every 14 (fourteen) days. **already on maintenance**, Disp: 12 mL, Rfl: 1   fluticasone-salmeterol (ADVAIR) 500-50 MCG/ACT AEPB, INHALE 1 PUFF INTO THE LUNGS IN THE MORNING AND AT BEDTIME., Disp: 180 each, Rfl: 3   hydrochlorothiazide (HYDRODIURIL) 25 MG tablet, Take 25 mg by mouth daily.,  Disp: , Rfl:    loratadine (CLARITIN) 10 MG tablet, Take 10 mg by mouth daily. (Patient not taking: Reported on 06/03/2023), Disp: , Rfl:    montelukast (SINGULAIR) 10 MG tablet, TAKE 1 TABLET (10 MG TOTAL) BY MOUTH AT BEDTIME., Disp: 90 tablet, Rfl: 3   montelukast (SINGULAIR) 10 MG tablet, 1 tablet, Disp: , Rfl:    montelukast (SINGULAIR) 10 MG tablet, TAKE 1 TABLET AT BEDTIME (NEED MD APPOINTMENT), Disp: 90 tablet, Rfl: 3   Omega-3 Fatty Acids (FISH OIL) 1200 MG CAPS, Take 1 capsule by mouth daily., Disp: , Rfl:    pantoprazole (PROTONIX) 40 MG tablet, 1 tablet, Disp: , Rfl:    pantoprazole (PROTONIX) 40 MG tablet, TAKE 1 TABLET (40 MG TOTAL) BY MOUTH DAILY., Disp: 90 tablet, Rfl: 1   pantoprazole (PROTONIX) 40 MG tablet, TAKE 1 TABLET (40 MG TOTAL) BY MOUTH DAILY., Disp: 90 tablet, Rfl: 3   Peak Flow Meter DEVI, Dispense peak flow meter for asthma, Disp: 1 each, Rfl: 0   RESTASIS 0.05 % ophthalmic emulsion, 1 drop 2 (two) times daily., Disp: , Rfl:    Spacer/Aero-Holding Chambers DEVI, Use with inhalers as directed., Disp: 1 each, Rfl: 1   SPIRIVA RESPIMAT 2.5 MCG/ACT AERS, , Disp: , Rfl:    Tiotropium Bromide Monohydrate (SPIRIVA RESPIMAT) 2.5 MCG/ACT AERS, Inhale 2 each into the lungs daily., Disp: , Rfl:    traMADol (ULTRAM) 50 MG tablet,  Take 50 mg by mouth every 12 (twelve) hours as needed for moderate pain. (Patient not taking: Reported on 06/03/2023), Disp: , Rfl:    TURMERIC CURCUMIN PO, Take 1 capsule by mouth daily., Disp: , Rfl:   Past Medical History: Past Medical History:  Diagnosis Date   Arthritis    right knee, neck   Asthma    Chronic kidney disease    COPD (chronic obstructive pulmonary disease) (HCC)    Hypertension    Mouth trouble    infection from tooth that was pulled approx. mid Mar. 2013    Tobacco Use: Social History   Tobacco Use  Smoking Status Former   Current packs/day: 0.00   Average packs/day: 1 pack/day for 45.0 years (45.0 ttl pk-yrs)   Types:  Cigarettes   Start date: 10/26/1966   Quit date: 10/27/2011   Years since quitting: 11.7  Smokeless Tobacco Never    Labs: Review Flowsheet       Latest Ref Rng & Units 10/21/2011 11/26/2011 03/10/2012  Labs for ITP Cardiac and Pulmonary Rehab  PH, Arterial 7.350 - 7.400 - 7.407  7.411   PCO2 arterial 35.0 - 45.0 mmHg - 38.2  41.3   Bicarbonate 20.0 - 24.0 mEq/L - 23.6  25.7   TCO2 0 - 100 mmol/L 26  21.0  23.0   Acid-base deficit 0.0 - 2.0 mmol/L - 0.3  -  O2 Saturation % - 96.6  92.0     Details            Capillary Blood Glucose: No results found for: "GLUCAP"   Pulmonary Assessment Scores:  Pulmonary Assessment Scores     Row Name 06/03/23 1333         ADL UCSD   ADL Phase Entry     SOB Score total 56       CAT Score   CAT Score 25       mMRC Score   mMRC Score 3             UCSD: Self-administered rating of dyspnea associated with activities of daily living (ADLs) 6-point scale (0 = "not at all" to 5 = "maximal or unable to do because of breathlessness")  Scoring Scores range from 0 to 120.  Minimally important difference is 5 units  CAT: CAT can identify the health impairment of COPD patients and is better correlated with disease progression.  CAT has a scoring range of zero to 40. The CAT score is classified into four groups of low (less than 10), medium (10 - 20), high (21-30) and very high (31-40) based on the impact level of disease on health status. A CAT score over 10 suggests significant symptoms.  A worsening CAT score could be explained by an exacerbation, poor medication adherence, poor inhaler technique, or progression of COPD or comorbid conditions.  CAT MCID is 2 points  mMRC: mMRC (Modified Medical Research Council) Dyspnea Scale is used to assess the degree of baseline functional disability in patients of respiratory disease due to dyspnea. No minimal important difference is established. A decrease in score of 1 point or greater is  considered a positive change.   Pulmonary Function Assessment:  Pulmonary Function Assessment - 06/03/23 1332       Breath   Bilateral Breath Sounds Clear    Shortness of Breath Yes             Exercise Target Goals: Exercise Program Goal: Individual exercise prescription set using results from initial  6 min walk test and THRR while considering  patient's activity barriers and safety.   Exercise Prescription Goal: Initial exercise prescription builds to 30-45 minutes a day of aerobic activity, 2-3 days per week.  Home exercise guidelines will be given to patient during program as part of exercise prescription that the participant will acknowledge.  Activity Barriers & Risk Stratification:  Activity Barriers & Cardiac Risk Stratification - 06/03/23 1326       Activity Barriers & Cardiac Risk Stratification   Activity Barriers Deconditioning;Muscular Weakness;Shortness of Breath;Arthritis    Cardiac Risk Stratification Low             6 Minute Walk:  6 Minute Walk     Row Name 06/03/23 1421         6 Minute Walk   Phase Initial     Distance 812 feet     Walk Time 6 minutes     # of Rest Breaks 0     MPH 1.54     METS 1.56     RPE 11     Perceived Dyspnea  1     VO2 Peak 5.47     Symptoms Yes (comment)     Comments fatigue/SOB     Resting HR 57 bpm     Resting BP 114/60     Resting Oxygen Saturation  96 %     Exercise Oxygen Saturation  during 6 min walk 89 %     Max Ex. HR 88 bpm     Max Ex. BP 136/80     2 Minute Post BP 106/60       Interval HR   1 Minute HR 73     2 Minute HR 88     3 Minute HR 88     4 Minute HR 88     5 Minute HR 85     6 Minute HR 87     2 Minute Post HR 71     Interval Heart Rate? Yes       Interval Oxygen   Interval Oxygen? Yes     Baseline Oxygen Saturation % 96 %     1 Minute Oxygen Saturation % 93 %     1 Minute Liters of Oxygen 0 L     2 Minute Oxygen Saturation % 91 %     2 Minute Liters of Oxygen 0 L     3  Minute Oxygen Saturation % 91 %     3 Minute Liters of Oxygen 0 L     4 Minute Oxygen Saturation % 90 %     4 Minute Liters of Oxygen 0 L     5 Minute Oxygen Saturation % 93 %     5 Minute Liters of Oxygen 0 L     6 Minute Oxygen Saturation % 89 %     6 Minute Liters of Oxygen 0 L     2 Minute Post Oxygen Saturation % 97 %     2 Minute Post Liters of Oxygen 0 L              Oxygen Initial Assessment:  Oxygen Initial Assessment - 06/03/23 1328       Home Oxygen   Home Oxygen Device None    Sleep Oxygen Prescription None    Home Exercise Oxygen Prescription None    Home Resting Oxygen Prescription None      Initial 6 min Walk   Oxygen Used  None      Program Oxygen Prescription   Program Oxygen Prescription None      Intervention   Short Term Goals To learn and understand importance of maintaining oxygen saturations>88%;To learn and demonstrate proper use of respiratory medications;To learn and understand importance of monitoring SPO2 with pulse oximeter and demonstrate accurate use of the pulse oximeter.;To learn and demonstrate proper pursed lip breathing techniques or other breathing techniques.     Long  Term Goals Maintenance of O2 saturations>88%;Compliance with respiratory medication;Verbalizes importance of monitoring SPO2 with pulse oximeter and return demonstration;Exhibits proper breathing techniques, such as pursed lip breathing or other method taught during program session;Demonstrates proper use of MDI's             Oxygen Re-Evaluation:  Oxygen Re-Evaluation     Row Name 06/10/23 1528 07/13/23 1231           Program Oxygen Prescription   Program Oxygen Prescription None None        Home Oxygen   Home Oxygen Device None None      Sleep Oxygen Prescription None None      Home Exercise Oxygen Prescription None None      Home Resting Oxygen Prescription None None        Goals/Expected Outcomes   Short Term Goals To learn and understand importance  of maintaining oxygen saturations>88%;To learn and demonstrate proper use of respiratory medications;To learn and understand importance of monitoring SPO2 with pulse oximeter and demonstrate accurate use of the pulse oximeter.;To learn and demonstrate proper pursed lip breathing techniques or other breathing techniques.  To learn and understand importance of maintaining oxygen saturations>88%;To learn and demonstrate proper use of respiratory medications;To learn and understand importance of monitoring SPO2 with pulse oximeter and demonstrate accurate use of the pulse oximeter.;To learn and demonstrate proper pursed lip breathing techniques or other breathing techniques.       Long  Term Goals Maintenance of O2 saturations>88%;Compliance with respiratory medication;Verbalizes importance of monitoring SPO2 with pulse oximeter and return demonstration;Exhibits proper breathing techniques, such as pursed lip breathing or other method taught during program session;Demonstrates proper use of MDI's Maintenance of O2 saturations>88%;Compliance with respiratory medication;Verbalizes importance of monitoring SPO2 with pulse oximeter and return demonstration;Exhibits proper breathing techniques, such as pursed lip breathing or other method taught during program session;Demonstrates proper use of MDI's      Goals/Expected Outcomes Compliance and understanding of oxygen saturations monitoring and breathing techniques to decrease shortness of breath. Compliance and understanding of oxygen saturations monitoring and breathing techniques to decrease shortness of breath.               Oxygen Discharge (Final Oxygen Re-Evaluation):  Oxygen Re-Evaluation - 07/13/23 1231       Program Oxygen Prescription   Program Oxygen Prescription None      Home Oxygen   Home Oxygen Device None    Sleep Oxygen Prescription None    Home Exercise Oxygen Prescription None    Home Resting Oxygen Prescription None       Goals/Expected Outcomes   Short Term Goals To learn and understand importance of maintaining oxygen saturations>88%;To learn and demonstrate proper use of respiratory medications;To learn and understand importance of monitoring SPO2 with pulse oximeter and demonstrate accurate use of the pulse oximeter.;To learn and demonstrate proper pursed lip breathing techniques or other breathing techniques.     Long  Term Goals Maintenance of O2 saturations>88%;Compliance with respiratory medication;Verbalizes importance of monitoring SPO2 with pulse oximeter and  return demonstration;Exhibits proper breathing techniques, such as pursed lip breathing or other method taught during program session;Demonstrates proper use of MDI's    Goals/Expected Outcomes Compliance and understanding of oxygen saturations monitoring and breathing techniques to decrease shortness of breath.             Initial Exercise Prescription:  Initial Exercise Prescription - 06/03/23 1400       Date of Initial Exercise RX and Referring Provider   Date 06/03/23    Referring Provider [name]    Expected Discharge Date 08/27/23      NuStep   Level 2    SPM 60    Minutes 15    METs 1.5      Track   Minutes 15    METs 1.5      Prescription Details   Frequency (times per week) 2    Duration Progress to 30 minutes of continuous aerobic without signs/symptoms of physical distress      Intensity   THRR 40-80% of Max Heartrate 58-117    Ratings of Perceived Exertion 11-13    Perceived Dyspnea 0-4      Progression   Progression Continue progressive overload as per policy without signs/symptoms or physical distress.      Resistance Training   Training Prescription Yes    Weight blue bands    Reps 10-15             Perform Capillary Blood Glucose checks as needed.  Exercise Prescription Changes:   Exercise Prescription Changes     Row Name 06/16/23 1500 06/30/23 1400 07/14/23 1400         Response to Exercise    Blood Pressure (Admit) 100/62 116/64 112/66     Blood Pressure (Exercise) 126/60 130/72 140/70     Blood Pressure (Exit) 110/70 102/70 100/66     Heart Rate (Admit) 67 bpm 57 bpm 54 bpm     Heart Rate (Exercise) 82 bpm 87 bpm 65 bpm     Heart Rate (Exit) 63 bpm 64 bpm 54 bpm     Oxygen Saturation (Admit) 97 % 96 % 96 %     Oxygen Saturation (Exercise) 95 % 96 % 97 %     Oxygen Saturation (Exit) 97 % 95 % 97 %     Rating of Perceived Exertion (Exercise) 12 13 11      Perceived Dyspnea (Exercise) 1 2 1      Duration Progress to 30 minutes of  aerobic without signs/symptoms of physical distress Continue with 30 min of aerobic exercise without signs/symptoms of physical distress. Continue with 30 min of aerobic exercise without signs/symptoms of physical distress.     Intensity THRR unchanged THRR unchanged THRR unchanged       Progression   Progression Continue to progress workloads to maintain intensity without signs/symptoms of physical distress. Continue to progress workloads to maintain intensity without signs/symptoms of physical distress. Continue to progress workloads to maintain intensity without signs/symptoms of physical distress.       Resistance Training   Training Prescription Yes Yes Yes     Weight blue bands blue bands blue bands     Reps 10-15 10-15 10-15     Time 10 Minutes 10 Minutes 10 Minutes       NuStep   Level 2 5 5      SPM 72 -- --     Minutes 15 15 15      METs 2 2.5 2.8  Track   Laps -- 12 12     Minutes 12 15 15      METs 2.85 2.85 2.85              Exercise Comments:   Exercise Comments     Row Name 06/09/23 1610           Exercise Comments Pt completed first day of group exercise. She exercised on the recumbent stepper for 15 min, level 2, METs 1.5. Pt then walked on the track for 11 laps, METs 2.69, for 15 min. Tolerated well. Discussed METs. She performed leg extentions instead of squats due to knee pain.                Exercise  Goals and Review:   Exercise Goals     Row Name 06/03/23 1327 06/10/23 1525 07/13/23 1215         Exercise Goals   Increase Physical Activity Yes Yes Yes     Intervention Provide advice, education, support and counseling about physical activity/exercise needs.;Develop an individualized exercise prescription for aerobic and resistive training based on initial evaluation findings, risk stratification, comorbidities and participant's personal goals. Provide advice, education, support and counseling about physical activity/exercise needs.;Develop an individualized exercise prescription for aerobic and resistive training based on initial evaluation findings, risk stratification, comorbidities and participant's personal goals. Provide advice, education, support and counseling about physical activity/exercise needs.;Develop an individualized exercise prescription for aerobic and resistive training based on initial evaluation findings, risk stratification, comorbidities and participant's personal goals.     Expected Outcomes Short Term: Attend rehab on a regular basis to increase amount of physical activity.;Long Term: Exercising regularly at least 3-5 days a week.;Long Term: Add in home exercise to make exercise part of routine and to increase amount of physical activity. Short Term: Attend rehab on a regular basis to increase amount of physical activity.;Long Term: Exercising regularly at least 3-5 days a week.;Long Term: Add in home exercise to make exercise part of routine and to increase amount of physical activity. Short Term: Attend rehab on a regular basis to increase amount of physical activity.;Long Term: Exercising regularly at least 3-5 days a week.;Long Term: Add in home exercise to make exercise part of routine and to increase amount of physical activity.     Increase Strength and Stamina Yes Yes Yes     Intervention Provide advice, education, support and counseling about physical  activity/exercise needs.;Develop an individualized exercise prescription for aerobic and resistive training based on initial evaluation findings, risk stratification, comorbidities and participant's personal goals. Provide advice, education, support and counseling about physical activity/exercise needs.;Develop an individualized exercise prescription for aerobic and resistive training based on initial evaluation findings, risk stratification, comorbidities and participant's personal goals. Provide advice, education, support and counseling about physical activity/exercise needs.;Develop an individualized exercise prescription for aerobic and resistive training based on initial evaluation findings, risk stratification, comorbidities and participant's personal goals.     Expected Outcomes Short Term: Increase workloads from initial exercise prescription for resistance, speed, and METs.;Short Term: Perform resistance training exercises routinely during rehab and add in resistance training at home;Long Term: Improve cardiorespiratory fitness, muscular endurance and strength as measured by increased METs and functional capacity ( ) Short Term: Increase workloads from initial exercise prescription for resistance, speed, and METs.;Short Term: Perform resistance training exercises routinely during rehab and add in resistance training at home;Long Term: Improve cardiorespiratory fitness, muscular endurance and strength as measured by increased METs and functional capacity ( ) Short Term:  Increase workloads from initial exercise prescription for resistance, speed, and METs.;Short Term: Perform resistance training exercises routinely during rehab and add in resistance training at home;Long Term: Improve cardiorespiratory fitness, muscular endurance and strength as measured by increased METs and functional capacity ( )     Able to understand and use rate of perceived exertion (RPE) scale Yes Yes Yes     Intervention  Provide education and explanation on how to use RPE scale Provide education and explanation on how to use RPE scale Provide education and explanation on how to use RPE scale     Expected Outcomes Short Term: Able to use RPE daily in rehab to express subjective intensity level;Long Term:  Able to use RPE to guide intensity level when exercising independently Short Term: Able to use RPE daily in rehab to express subjective intensity level;Long Term:  Able to use RPE to guide intensity level when exercising independently Short Term: Able to use RPE daily in rehab to express subjective intensity level;Long Term:  Able to use RPE to guide intensity level when exercising independently     Able to understand and use Dyspnea scale Yes Yes Yes     Intervention Provide education and explanation on how to use Dyspnea scale Provide education and explanation on how to use Dyspnea scale Provide education and explanation on how to use Dyspnea scale     Expected Outcomes Short Term: Able to use Dyspnea scale daily in rehab to express subjective sense of shortness of breath during exertion;Long Term: Able to use Dyspnea scale to guide intensity level when exercising independently Short Term: Able to use Dyspnea scale daily in rehab to express subjective sense of shortness of breath during exertion;Long Term: Able to use Dyspnea scale to guide intensity level when exercising independently Short Term: Able to use Dyspnea scale daily in rehab to express subjective sense of shortness of breath during exertion;Long Term: Able to use Dyspnea scale to guide intensity level when exercising independently     Knowledge and understanding of Target Heart Rate Range (THRR) Yes Yes Yes     Intervention Provide education and explanation of THRR including how the numbers were predicted and where they are located for reference Provide education and explanation of THRR including how the numbers were predicted and where they are located for  reference Provide education and explanation of THRR including how the numbers were predicted and where they are located for reference     Expected Outcomes Short Term: Able to state/look up THRR;Long Term: Able to use THRR to govern intensity when exercising independently;Short Term: Able to use daily as guideline for intensity in rehab Short Term: Able to state/look up THRR;Long Term: Able to use THRR to govern intensity when exercising independently;Short Term: Able to use daily as guideline for intensity in rehab Short Term: Able to state/look up THRR;Long Term: Able to use THRR to govern intensity when exercising independently;Short Term: Able to use daily as guideline for intensity in rehab     Understanding of Exercise Prescription Yes Yes Yes     Intervention Provide education, explanation, and written materials on patient's individual exercise prescription Provide education, explanation, and written materials on patient's individual exercise prescription Provide education, explanation, and written materials on patient's individual exercise prescription     Expected Outcomes Short Term: Able to explain program exercise prescription;Long Term: Able to explain home exercise prescription to exercise independently Short Term: Able to explain program exercise prescription;Long Term: Able to explain home exercise prescription to exercise  independently Short Term: Able to explain program exercise prescription;Long Term: Able to explain home exercise prescription to exercise independently              Exercise Goals Re-Evaluation :  Exercise Goals Re-Evaluation     Row Name 06/10/23 1525 07/13/23 1215           Exercise Goal Re-Evaluation   Exercise Goals Review Increase Physical Activity;Able to understand and use Dyspnea scale;Understanding of Exercise Prescription;Increase Strength and Stamina;Knowledge and understanding of Target Heart Rate Range (THRR);Able to understand and use rate of  perceived exertion (RPE) scale Increase Physical Activity;Able to understand and use Dyspnea scale;Understanding of Exercise Prescription;Increase Strength and Stamina;Knowledge and understanding of Target Heart Rate Range (THRR);Able to understand and use rate of perceived exertion (RPE) scale      Comments Pt has completed one day of group exercise. She exercised on the recumbent stepper for 15 min, level 2, METs 1.5. Pt then walked on the track for 11 laps, METs 2.69, for 15 min. Tolerated well. She performed leg extentions instead of squats due to knee pain. Will progress as tolerated. Pt has completed 9 days of group exercise. She exercised on the recumbent stepper for 15 min, level 5, METs 3.2. Pt then is walking on the track for 1 laps, METs 2.85, for 15 min. Tolerated well. She is working on squats. Progressing well.      Expected Outcomes Through exercise at rehab and at home, the patient will decrease shortness of breath with daily activities and feel confident in carrying out an exercise regime at home. Through exercise at rehab and at home, the patient will decrease shortness of breath with daily activities and feel confident in carrying out an exercise regime at home.               Discharge Exercise Prescription (Final Exercise Prescription Changes):  Exercise Prescription Changes - 07/14/23 1400       Response to Exercise   Blood Pressure (Admit) 112/66    Blood Pressure (Exercise) 140/70    Blood Pressure (Exit) 100/66    Heart Rate (Admit) 54 bpm    Heart Rate (Exercise) 65 bpm    Heart Rate (Exit) 54 bpm    Oxygen Saturation (Admit) 96 %    Oxygen Saturation (Exercise) 97 %    Oxygen Saturation (Exit) 97 %    Rating of Perceived Exertion (Exercise) 11    Perceived Dyspnea (Exercise) 1    Duration Continue with 30 min of aerobic exercise without signs/symptoms of physical distress.    Intensity THRR unchanged      Progression   Progression Continue to progress workloads  to maintain intensity without signs/symptoms of physical distress.      Resistance Training   Training Prescription Yes    Weight blue bands    Reps 10-15    Time 10 Minutes      NuStep   Level 5    Minutes 15    METs 2.8      Track   Laps 12    Minutes 15    METs 2.85             Nutrition:  Target Goals: Understanding of nutrition guidelines, daily intake of sodium 1500mg , cholesterol 200mg , calories 30% from fat and 7% or less from saturated fats, daily to have 5 or more servings of fruits and vegetables.  Biometrics:  Pre Biometrics - 06/03/23 1425       Pre Biometrics  Grip Strength 34 kg              Nutrition Therapy Plan and Nutrition Goals:  Nutrition Therapy & Goals - 07/07/23 1452       Nutrition Therapy   Diet Heart healthy diet    Drug/Food Interactions Statins/Certain Fruits      Personal Nutrition Goals   Nutrition Goal Patient to improve diet quality by using the plate method as a guide for meal planning to include lean protein/plant protein, fruits, vegetables, whole grains, nonfat dairy as part of a well-balanced diet.   goal in progress.   Personal Goal #2 Patient to limit sodium to 2300mg  per day   goal in progress.   Comments She reports no nutrition questions/concerns at this time. Patient is down 2.2# since starting with our program. Patient will benefit from participation in pulmonary rehab for nutrition, exercise, and lifestyle modification.      Intervention Plan   Intervention Prescribe, educate and counsel regarding individualized specific dietary modifications aiming towards targeted core components such as weight, hypertension, lipid management, diabetes, heart failure and other comorbidities.    Expected Outcomes Short Term Goal: Understand basic principles of dietary content, such as calories, fat, sodium, cholesterol and nutrients.;Long Term Goal: Adherence to prescribed nutrition plan.             Nutrition  Assessments:  Nutrition Assessments - 06/12/23 0810       Rate Your Plate Scores   Pre Score 66            MEDIFICTS Score Key: ?70 Need to make dietary changes  40-70 Heart Healthy Diet ? 40 Therapeutic Level Cholesterol Diet  Flowsheet Row PULMONARY REHAB CHRONIC OBSTRUCTIVE PULMONARY DISEASE from 06/09/2023 in [facility]  Picture Your Plate Total Score on Admission 66      Picture Your Plate Scores: <86 Unhealthy dietary pattern with much room for improvement. 41-50 Dietary pattern unlikely to meet recommendations for good health and room for improvement. 51-60 More healthful dietary pattern, with some room for improvement.  >60 Healthy dietary pattern, although there may be some specific behaviors that could be improved.    Nutrition Goals Re-Evaluation:  Nutrition Goals Re-Evaluation     Row Name 06/09/23 1606 07/07/23 1452           Goals   Current Weight 181 lb 14.1 oz (82.5 kg) 179 lb 7.3 oz (81.4 kg)      Comment labs not available from outside provider labs not available from outside provider      Expected Outcome Robertha works part-time. She reports no nutrition questions/concerns at this time. Patient will benefit from participation in pulmonary rehab for nutrition, exercise, and lifestyle modification. She reports no nutrition questions/concerns at this time. Patient is down 2.2# since starting with our program. Patient will benefit from participation in pulmonary rehab for nutrition, exercise, and lifestyle modification.               Nutrition Goals Discharge (Final Nutrition Goals Re-Evaluation):  Nutrition Goals Re-Evaluation - 07/07/23 1452       Goals   Current Weight 179 lb 7.3 oz (81.4 kg)    Comment labs not available from outside provider    Expected Outcome She reports no nutrition questions/concerns at this time. Patient is down 2.2# since starting with our program. Patient will  benefit from participation in pulmonary rehab for nutrition, exercise, and lifestyle modification.  Psychosocial: Target Goals: Acknowledge presence or absence of significant depression and/or stress, maximize coping skills, provide positive support system. Participant is able to verbalize types and ability to use techniques and skills needed for reducing stress and depression.  Initial Review & Psychosocial Screening:  Initial Psych Review & Screening - 06/03/23 1319       Initial Review   Current issues with Current Stress Concerns    Source of Stress Concerns Family    Comments Atiyah expresses she has stress due to being her brothers healthcare power of attorney. He has a brain injury and lives in an assisted living facility. Shoshanna washes his clothes weekly and goes with him to all of his appointments.      Family Dynamics   Good Support System? Yes      Barriers   Psychosocial barriers to participate in program The patient should benefit from training in stress management and relaxation.      Screening Interventions   Interventions Encouraged to exercise;To provide support and resources with identified psychosocial needs    Expected Outcomes Short Term goal: Utilizing psychosocial counselor, staff and physician to assist with identification of specific Stressors or current issues interfering with healing process. Setting desired goal for each stressor or current issue identified.;Long Term Goal: Stressors or current issues are controlled or eliminated.             Quality of Life Scores:  Scores of 19 and below usually indicate a poorer quality of life in these areas.  A difference of  2-3 points is a clinically meaningful difference.  A difference of 2-3 points in the total score of the Quality of Life Index has been associated with significant improvement in overall quality of life, self-image, physical symptoms, and general health in studies assessing  change in quality of life.  PHQ-9: Review Flowsheet       06/03/2023  Depression screen PHQ 2/9  Decreased Interest 0  Down, Depressed, Hopeless 0  PHQ - 2 Score 0  Altered sleeping 0  Tired, decreased energy 0  Change in appetite 0  Feeling bad or failure about yourself  0  Trouble concentrating 0  Moving slowly or fidgety/restless 0  Suicidal thoughts 0  PHQ-9 Score 0  Difficult doing work/chores Not difficult at all    Details           Interpretation of Total Score  Total Score Depression Severity:  1-4 = Minimal depression, 5-9 = Mild depression, 10-14 = Moderate depression, 15-19 = Moderately severe depression, 20-27 = Severe depression   Psychosocial Evaluation and Intervention:  Psychosocial Evaluation - 06/03/23 1320       Psychosocial Evaluation & Interventions   Interventions Encouraged to exercise with the program and follow exercise prescription;Stress management education    Comments Keairah expresses she has stress due to being her brothers healthcare power of attorney. He has a brain injury and lives in an assisted living facility. Emersyn washes his clothes weekly and goes with him to all of his appointments.    Expected Outcomes For Kennetha to participate in PR free of any psychosocial barriers or concerns    Continue Psychosocial Services  No Follow up required             Psychosocial Re-Evaluation:  Psychosocial Re-Evaluation     Row Name 06/12/23 1056 07/10/23 1102           Psychosocial Re-Evaluation   Current issues with Current Stress Concerns Current Stress Concerns  Comments No changes since orientation. Jenee has been to 1 class. Airalynn continues to be the primary caregiver for her brother, who has suffered a brain injury. She visits him at least daily, washes his clothes, takes and assists him with all appointments and makes sure he is getting excellent care at his skilled nursing facility. She is consistently putting her  brother's needs before her own. She has been educated, provided, and we have practiced in class stress reducing techniques. Frimy currently declines assistance finding a Financial trader.      Expected Outcomes For Allexus to continue to attend pulmonary rehab and to have a positive outlook and good coping skills to manage her stress. For Brendan to continue to attend pulmonary rehab and to have a positive outlook and good coping skills to manage her stress.      Interventions Encouraged to attend Pulmonary Rehabilitation for the exercise;Stress management education;Relaxation education Encouraged to attend Pulmonary Rehabilitation for the exercise;Stress management education;Relaxation education      Continue Psychosocial Services  Follow up required by staff No Follow up required               Psychosocial Discharge (Final Psychosocial Re-Evaluation):  Psychosocial Re-Evaluation - 07/10/23 1102       Psychosocial Re-Evaluation   Current issues with Current Stress Concerns    Comments Rilie continues to be the primary caregiver for her brother, who has suffered a brain injury. She visits him at least daily, washes his clothes, takes and assists him with all appointments and makes sure he is getting excellent care at his skilled nursing facility. She is consistently putting her brother's needs before her own. She has been educated, provided, and we have practiced in class stress reducing techniques. Teasia currently declines assistance finding a Financial trader.    Expected Outcomes For Nashiyah to continue to attend pulmonary rehab and to have a positive outlook and good coping skills to manage her stress.    Interventions Encouraged to attend Pulmonary Rehabilitation for the exercise;Stress management education;Relaxation education    Continue Psychosocial Services  No Follow up required             Education: Education Goals: Education classes will be provided  on a weekly basis, covering required topics. Participant will state understanding/return demonstration of topics presented.  Learning Barriers/Preferences:  Learning Barriers/Preferences - 06/03/23 1325       Learning Barriers/Preferences   Learning Barriers Sight    Learning Preferences Group Instruction;Skilled Demonstration             Education Topics: Introduction to Pulmonary Rehab Group instruction provided by PowerPoint, verbal discussion, and written material to support subject matter. Instructor reviews what Pulmonary Rehab is, the purpose of the program, and how patients are referred.     Know Your Numbers Group instruction that is supported by a PowerPoint presentation. Instructor discusses importance of knowing and understanding resting, exercise, and post-exercise oxygen saturation, heart rate, and blood pressure. Oxygen saturation, heart rate, blood pressure, rating of perceived exertion, and dyspnea are reviewed along with a normal range for these values.  Flowsheet Row PULMONARY REHAB CHRONIC OBSTRUCTIVE PULMONARY DISEASE from 06/18/2023 in [facility]  Date 06/18/23  Educator EP  Instruction Review Code 1- Verbalizes Understanding       Exercise for the Pulmonary Patient Group instruction that is supported by a PowerPoint presentation. Instructor discusses benefits of exercise, core components of exercise, frequency, duration,  and intensity of an exercise routine, importance of utilizing pulse oximetry during exercise, safety while exercising, and options of places to exercise outside of rehab.       MET Level  Group instruction provided by PowerPoint, verbal discussion, and written material to support subject matter. Instructor reviews what METs are and how to increase METs.    Pulmonary Medications Verbally interactive group education provided by instructor with focus on inhaled medications and  proper administration.   Anatomy and Physiology of the Respiratory System Group instruction provided by PowerPoint, verbal discussion, and written material to support subject matter. Instructor reviews respiratory cycle and anatomical components of the respiratory system and their functions. Instructor also reviews differences in obstructive and restrictive respiratory diseases with examples of each.    Oxygen Safety Group instruction provided by PowerPoint, verbal discussion, and written material to support subject matter. There is an overview of "What is Oxygen" and "Why do we need it".  Instructor also reviews how to create a safe environment for oxygen use, the importance of using oxygen as prescribed, and the risks of noncompliance. There is a brief discussion on traveling with oxygen and resources the patient may utilize. Flowsheet Row PULMONARY REHAB CHRONIC OBSTRUCTIVE PULMONARY DISEASE from 06/25/2023 in [facility], V[facility], [facility]  Date 06/25/23  Educator RN  Instruction Review Code 1- Verbalizes Understanding       Oxygen Use Group instruction provided by PowerPoint, verbal discussion, and written material to discuss how supplemental oxygen is prescribed and different types of oxygen supply systems. Resources for more information are provided.  Flowsheet Row PULMONARY REHAB CHRONIC OBSTRUCTIVE PULMONARY DISEASE from 07/02/2023 in [facility], [facility]  Date 07/02/23  Educator Baird Lyons, RT  Instruction Review Code 1- Verbalizes Understanding       Breathing Techniques Group instruction that is supported by demonstration and informational handouts. Instructor discusses the benefits of pursed lip and diaphragmatic breathing and detailed demonstration on how to perform both.  Flowsheet Row PULMONARY REHAB CHRONIC OBSTRUCTIVE PULMONARY DISEASE from 07/09/2023 in [facility]  for Heart, Vascular, & Lung Health  Date 07/09/23  Educator RN  Instruction Review Code 1- Verbalizes Understanding        Risk Factor Reduction Group instruction that is supported by a PowerPoint presentation. Instructor discusses the definition of a risk factor, different risk factors for pulmonary disease, and how the heart and lungs work together.   MD Day A group question and answer session with a medical doctor that allows participants to ask questions that relate to their pulmonary disease state.   Nutrition for the Pulmonary Patient Group instruction provided by PowerPoint slides, verbal discussion, and written materials to support subject matter. The instructor gives an explanation and review of healthy diet recommendations, which includes a discussion on weight management, recommendations for fruit and vegetable consumption, as well as protein, fluid, caffeine, fiber, sodium, sugar, and alcohol. Tips for eating when patients are short of breath are discussed.    Other Education Group or individual verbal, written, or video instructions that support the educational goals of the pulmonary rehab program.    Knowledge Questionnaire Score:  Knowledge Questionnaire Score - 06/03/23 1334       Knowledge Questionnaire Score   Pre Score 17/18             Core Components/Risk Factors/Patient Goals at Admission:  Personal Goals and Risk Factors at Admission - 06/03/23  1325       Core Components/Risk Factors/Patient Goals on Admission    Weight Management Weight Loss    Improve shortness of breath with ADL's Yes    Intervention Provide education, individualized exercise plan and daily activity instruction to help decrease symptoms of SOB with activities of daily living.    Expected Outcomes Short Term: Improve cardiorespiratory fitness to achieve a reduction of symptoms when performing ADLs;Long Term: Be able to perform more ADLs without symptoms or delay the onset of  symptoms    Increase knowledge of respiratory medications and ability to use respiratory devices properly  Yes    Intervention Provide education and demonstration as needed of appropriate use of medications, inhalers, and oxygen therapy.    Expected Outcomes Short Term: Achieves understanding of medications use. Understands that oxygen is a medication prescribed by physician. Demonstrates appropriate use of inhaler and oxygen therapy.;Long Term: Maintain appropriate use of medications, inhalers, and oxygen therapy.    Stress Yes    Intervention Offer individual and/or small group education and counseling on adjustment to heart disease, stress management and health-related lifestyle change. Teach and support self-help strategies.;Refer participants experiencing significant psychosocial distress to appropriate mental health specialists for further evaluation and treatment. When possible, include family members and significant others in education/counseling sessions.    Expected Outcomes Short Term: Participant demonstrates changes in health-related behavior, relaxation and other stress management skills, ability to obtain effective social support, and compliance with psychotropic medications if prescribed.;Long Term: Emotional wellbeing is indicated by absence of clinically significant psychosocial distress or social isolation.             Core Components/Risk Factors/Patient Goals Review:   Goals and Risk Factor Review     Row Name 06/12/23 1058 07/10/23 1108           Core Components/Risk Factors/Patient Goals Review   Personal Goals Review Weight Management/Obesity;Improve shortness of breath with ADL's;Develop more efficient breathing techniques such as purse lipped breathing and diaphragmatic breathing and practicing self-pacing with activity.;Increase knowledge of respiratory medications and ability to use respiratory devices properly.;Stress Weight Management/Obesity;Improve shortness of  breath with ADL's;Develop more efficient breathing techniques such as purse lipped breathing and diaphragmatic breathing and practicing self-pacing with activity.;Increase knowledge of respiratory medications and ability to use respiratory devices properly.;Stress      Review Goal in progress for weight loss. Sukaina has completed 1 class and is working with our dietician. Goal in progress on developing more efficient breathing techniques such as purse lipped breathing and diaphragmatic breathing; and practicing self-pacing with activity. Goal in progress on improving her shortness of breath with ADLs. Goal in progress for increasing her knowledge of respiratory medications and ability to use respiratory devices properly. Annazette has attended 1 class. Zoà will benefit from participation in PR for nutrition, education, exercise and lifestyle modification. Goal in progress for weight loss. Evan is working with our dietician. Goal in progress on developing more efficient breathing techniques such as purse lipped breathing and diaphragmatic breathing; and practicing self-pacing with activity. Goal in progress on improving her shortness of breath with ADLs. Her oxygen saturation has remained WNL on room air. Goal met for increasing her knowledge of respiratory medications and ability to use respiratory devices properly. She has correctly demonstrated and voiced when to use her medications with our respiratory therapist. Jazmynn is currently working on reducing her stress through meditation and exercise. Merryn will benefit from participation in PR for nutrition, education, exercise, and lifestyle modification.  Expected Outcomes See admission goals See admission goals               Core Components/Risk Factors/Patient Goals at Discharge (Final Review):   Goals and Risk Factor Review - 07/10/23 1108       Core Components/Risk Factors/Patient Goals Review   Personal Goals Review Weight  Management/Obesity;Improve shortness of breath with ADL's;Develop more efficient breathing techniques such as purse lipped breathing and diaphragmatic breathing and practicing self-pacing with activity.;Increase knowledge of respiratory medications and ability to use respiratory devices properly.;Stress    Review Goal in progress for weight loss. Sadiyah is working with our dietician. Goal in progress on developing more efficient breathing techniques such as purse lipped breathing and diaphragmatic breathing; and practicing self-pacing with activity. Goal in progress on improving her shortness of breath with ADLs. Her oxygen saturation has remained WNL on room air. Goal met for increasing her knowledge of respiratory medications and ability to use respiratory devices properly. She has correctly demonstrated and voiced when to use her medications with our respiratory therapist. Willola is currently working on reducing her stress through meditation and exercise. Katonia will benefit from participation in PR for nutrition, education, exercise, and lifestyle modification.    Expected Outcomes See admission goals             ITP Comments:Pt is making expected progress toward Pulmonary Rehab goals after completing 10 sessions. Recommend continued exercise, life style modification, education, and utilization of breathing techniques to increase stamina and strength, while also decreasing shortness of breath with exertion.  Dr. Mechele Collin is Medical Director for Pulmonary Rehab at [facility].     Comments: Dr. Mechele Collin is Medical Director for Pulmonary Rehab at [facility].

## 2023-07-16 ENCOUNTER — Encounter (HOSPITAL_COMMUNITY): Payer: Medicare HMO

## 2023-07-21 ENCOUNTER — Other Ambulatory Visit: Payer: Self-pay | Admitting: Internal Medicine

## 2023-07-21 ENCOUNTER — Encounter (HOSPITAL_COMMUNITY)
Admission: RE | Admit: 2023-07-21 | Discharge: 2023-07-21 | Disposition: A | Discharge status: Home / Self Care | Payer: Medicare HMO | Source: Ambulatory Visit | Attending: Internal Medicine | Admitting: Internal Medicine

## 2023-07-21 DIAGNOSIS — J4489 Other specified chronic obstructive pulmonary disease: Secondary | ICD-10-CM

## 2023-07-21 DIAGNOSIS — J449 Chronic obstructive pulmonary disease, unspecified: Secondary | ICD-10-CM | POA: Diagnosis not present

## 2023-07-21 DIAGNOSIS — Z87891 Personal history of nicotine dependence: Secondary | ICD-10-CM | POA: Diagnosis not present

## 2023-07-21 NOTE — Progress Notes (Signed)
Daily Session Note  Patient Details  Name: Barbara [name] MRN: [id number] Date of Birth: 10/02/1951 Referring Provider:   Doristine Devoid Pulmonary Rehab Walk Test from 06/03/2023 in [facility], [facility]  Referring Provider [name]       Encounter Date: 07/21/2023  Check In:  Session Check In - 07/21/23 1416       Check-In   Supervising physician immediately available to respond to emergencies [facility] MD immediately available    Physician(s) Jari Favre, PA    Location [facility]    Staff Present Elissa Lovett BS, ACSM-CEP, Exercise Physiologist;Samantha Belarus, RD, Rexene Agent, MS, ACSM-CEP, Exercise Physiologist;Mary Gerre Scull, RN, Fuller Plan, RT    Virtual Visit No    Medication changes reported     No    Fall or balance concerns reported    No    Tobacco Cessation No Change    Warm-up and Cool-down Performed as group-led instruction    Resistance Training Performed Yes    VAD Patient? No    PAD/SET Patient? No      Pain Assessment   Currently in Pain? No/denies    Multiple Pain Sites No             Capillary Blood Glucose: No results found for this or any previous visit (from the past 24 hour(s)).    Social History   Tobacco Use  Smoking Status Former   Current packs/day: 0.00   Average packs/day: 1 pack/day for 45.0 years (45.0 ttl pk-yrs)   Types: Cigarettes   Start date: 10/26/1966   Quit date: 10/27/2011   Years since quitting: 11.7  Smokeless Tobacco Never    Goals Met:  Proper associated with RPD/PD & O2 Sat Independence with exercise equipment Exercise tolerated well No report of concerns or symptoms today Strength training completed today  Goals Unmet:  Not Applicable  Comments: Service time is from 1312 to 1445.    Dr. Mechele Collin is Medical Director for Pulmonary Rehab at [facility].

## 2023-07-22 ENCOUNTER — Other Ambulatory Visit: Payer: Self-pay | Admitting: Pharmacist

## 2023-07-22 DIAGNOSIS — J4489 Other specified chronic obstructive pulmonary disease: Secondary | ICD-10-CM

## 2023-07-22 MED ORDER — DUPIXENT 300 MG/2ML ~~LOC~~ SOAJ
300.0000 mg | SUBCUTANEOUS | 1 refills | Status: DC
Start: 2023-07-22 — End: 2023-12-24

## 2023-07-23 ENCOUNTER — Encounter (HOSPITAL_COMMUNITY)
Admission: RE | Admit: 2023-07-23 | Discharge: 2023-07-23 | Disposition: A | Discharge status: Home / Self Care | Payer: Medicare HMO | Source: Ambulatory Visit | Attending: Internal Medicine | Admitting: Internal Medicine

## 2023-07-23 DIAGNOSIS — Z87891 Personal history of nicotine dependence: Secondary | ICD-10-CM | POA: Diagnosis not present

## 2023-07-23 DIAGNOSIS — J449 Chronic obstructive pulmonary disease, unspecified: Secondary | ICD-10-CM

## 2023-07-23 NOTE — Progress Notes (Signed)
Daily Session Note  Patient Details  Name: Barbara [name] MRN: [id number] Date of Birth: [birth year] Referring Provider:   Doristine Devoid Pulmonary Rehab Walk Test from 06/03/2023 in [facility]  Referring Provider [name]       Encounter Date: 07/23/2023  Check In:  Session Check In - 07/23/23 1345       Check-In   Supervising physician immediately available to respond to emergencies [facility] MD immediately available    Physician(s) Edd Fabian, NP    Location [facility]    Staff Present Raford Pitcher, MS, ACSM-CEP, Exercise Physiologist;Randi Dionisio Paschal, ACSM-CEP, Exercise Physiologist;Samantha Belarus, RD, Dutch Gray, RN, Fuller Plan, RT    Virtual Visit No    Medication changes reported     No    Fall or balance concerns reported    No    Tobacco Cessation No Change    Warm-up and Cool-down Performed as group-led instruction    Resistance Training Performed Yes    VAD Patient? No    PAD/SET Patient? No      Pain Assessment   Currently in Pain? No/denies    Multiple Pain Sites No             Capillary Blood Glucose: No results found for this or any previous visit (from the past 24 hour(s)).    Social History   Tobacco Use  Smoking Status Former   Current packs/day: 0.00   Average packs/day: 1 pack/day for 45.0 years (45.0 ttl pk-yrs)   Types: Cigarettes   Start date: 10/26/1966   Quit date: 10/27/2011   Years since quitting: 11.7  Smokeless Tobacco Never    Goals Met:  Proper associated with RPD/PD & O2 Sat Independence with exercise equipment Exercise tolerated well No report of concerns or symptoms today Strength training completed today  Goals Unmet:  Not Applicable  Comments: Service time is from 1307 to 1450.    Dr. Mechele Collin is Medical Director for Pulmonary Rehab at [facility].

## 2023-07-27 DIAGNOSIS — M25569 Pain in unspecified knee: Secondary | ICD-10-CM | POA: Diagnosis not present

## 2023-07-27 DIAGNOSIS — J449 Chronic obstructive pulmonary disease, unspecified: Secondary | ICD-10-CM | POA: Diagnosis not present

## 2023-07-27 DIAGNOSIS — I700 Atherosclerosis of aorta: Secondary | ICD-10-CM | POA: Diagnosis not present

## 2023-07-27 DIAGNOSIS — I129 Hypertensive chronic kidney disease with stage 1 through stage 4 chronic kidney disease, or unspecified chronic kidney disease: Secondary | ICD-10-CM | POA: Diagnosis not present

## 2023-07-27 DIAGNOSIS — N1831 Chronic kidney disease, stage 3a: Secondary | ICD-10-CM | POA: Diagnosis not present

## 2023-07-28 ENCOUNTER — Encounter (HOSPITAL_COMMUNITY)
Admission: RE | Admit: 2023-07-28 | Discharge: 2023-07-28 | Disposition: A | Discharge status: Home / Self Care | Payer: Medicare HMO | Source: Ambulatory Visit | Attending: Internal Medicine | Admitting: Internal Medicine

## 2023-07-28 ENCOUNTER — Encounter (HOSPITAL_COMMUNITY): Payer: Self-pay

## 2023-07-28 VITALS — Wt 181.2 lb

## 2023-07-28 DIAGNOSIS — J449 Chronic obstructive pulmonary disease, unspecified: Secondary | ICD-10-CM | POA: Diagnosis not present

## 2023-07-28 DIAGNOSIS — Z87891 Personal history of nicotine dependence: Secondary | ICD-10-CM | POA: Diagnosis not present

## 2023-07-28 NOTE — Progress Notes (Signed)
Daily Session Note  Patient Details  Name: Barbara [name] MRN: [id number] Date of Birth: 02/28/1951 Referring Provider:   Doristine Devoid Pulmonary Rehab Walk Test from 06/03/2023 in [facility]  Referring Provider [name]       Encounter Date: 07/28/2023  Check In:  Session Check In - 07/28/23 1445       Check-In   Supervising physician immediately available to respond to emergencies [facility] MD immediately available    Physician(s) Edd Fabian, NP    [facility]    Staff Present Raford Pitcher, MS, ACSM-CEP, Exercise Physiologist;Kelton Bultman Gerre Scull, RN, Fuller Plan, RT    Virtual Visit No    Medication changes reported     No    Fall or balance concerns reported    No    Tobacco Cessation No Change    Warm-up and Cool-down Performed as group-led instruction    Resistance Training Performed Yes    VAD Patient? No    PAD/SET Patient? No      Pain Assessment   Currently in Pain? No/denies    Multiple Pain Sites No             Capillary Blood Glucose: No results found for this or any previous visit (from the past 24 hour(s)).   Exercise Prescription Changes - 07/28/23 1400       Response to Exercise   Blood Pressure (Admit) 126/70    Blood Pressure (Exercise) 120/64    Blood Pressure (Exit) 92/58    Heart Rate (Admit) 58 bpm    Heart Rate (Exercise) 77 bpm    Heart Rate (Exit) 54 bpm    Oxygen Saturation (Admit) 97 %    Oxygen Saturation (Exercise) 95 %    Oxygen Saturation (Exit) 96 %    Rating of Perceived Exertion (Exercise) 13    Perceived Dyspnea (Exercise) 3    Duration Continue with 30 min of aerobic exercise without signs/symptoms of physical distress.    Intensity THRR unchanged      Progression   Progression Continue to progress workloads to maintain intensity without signs/symptoms of physical distress.      Resistance Training   Training Prescription Yes    Weight blue  bands    Reps 10-15    Time 10 Minutes      NuStep   Level 5    Minutes 15    METs 3      Track   Laps 13    Minutes 15    METs 3             Social History   Tobacco Use  Smoking Status Former   Current packs/day: 0.00   Average packs/day: 1 pack/day for 45.0 years (45.0 ttl pk-yrs)   Types: Cigarettes   Start date: 10/26/1966   Quit date: 10/27/2011   Years since quitting: 11.7  Smokeless Tobacco Never    Goals Met:  Independence with exercise equipment Exercise tolerated well No report of concerns or symptoms today Strength training completed today  Goals Unmet:  Not Applicable  Comments: Service time is from 1310 to 1436    Dr. Mechele Collin is Medical Director for Pulmonary Rehab at [facility].

## 2023-07-30 ENCOUNTER — Encounter (HOSPITAL_COMMUNITY)
Admission: RE | Admit: 2023-07-30 | Discharge: 2023-07-30 | Disposition: A | Discharge status: Home / Self Care | Payer: Medicare HMO | Source: Ambulatory Visit | Attending: Internal Medicine | Admitting: Internal Medicine

## 2023-07-30 DIAGNOSIS — Z87891 Personal history of nicotine dependence: Secondary | ICD-10-CM | POA: Diagnosis not present

## 2023-07-30 DIAGNOSIS — J449 Chronic obstructive pulmonary disease, unspecified: Secondary | ICD-10-CM

## 2023-07-30 NOTE — Progress Notes (Signed)
Daily Session Note  Patient Details  Name: BURNADETTE SHIMA MRN: [id number] Date of Birth: 06/03/1951 Referring Provider:   Doristine Devoid Pulmonary Rehab Walk Test from 06/03/2023 in [facility]  Referring Provider [name]       Encounter Date: 07/30/2023  Check In:  Session Check In - 07/30/23 1329       Check-In   Physician(s) Edd Fabian, NP    [facility]    Staff Present Raford Pitcher, MS, ACSM-CEP, Exercise Physiologist;Mary Gerre Scull, RN, BSN;Jermari Tamargo Katrinka Blazing, RT;Randi Reeve BS, ACSM-CEP, Exercise Physiologist;Samantha Belarus, RD, LDN    Virtual Visit No    Medication changes reported     No    Fall or balance concerns reported    No    Tobacco Cessation No Change    Warm-up and Cool-down Performed as group-led instruction    Resistance Training Performed Yes    VAD Patient? No    PAD/SET Patient? No      Pain Assessment   Currently in Pain? No/denies    Multiple Pain Sites No             Capillary Blood Glucose: No results found for this or any previous visit (from the past 24 hour(s)).    Social History   Tobacco Use  Smoking Status Former   Current packs/day: 0.00   Average packs/day: 1 pack/day for 45.0 years (45.0 ttl pk-yrs)   Types: Cigarettes   Start date: 10/26/1966   Quit date: 10/27/2011   Years since quitting: 11.7  Smokeless Tobacco Never    Goals Met:  Proper associated with RPD/PD & O2 Sat Independence with exercise equipment Exercise tolerated well No report of concerns or symptoms today Strength training completed today  Goals Unmet:  Not Applicable  Comments: Service time is from 1309 to 1440.    Dr. Mechele Collin is Medical Director for Pulmonary Rehab at [facility].

## 2023-08-03 ENCOUNTER — Telehealth: Payer: Self-pay | Admitting: Internal Medicine

## 2023-08-03 NOTE — Telephone Encounter (Signed)
Received pre-operative pulmonary evaluation request for Barbara [name] for colonoscopy under propofol for history of adenomatous polyps. See my recommendations below.    ASSESSMENT AND RECOMMENDATIONS:  Preoperative Risk Calculation: The features of this patient's history that contribute to the pulmonary risk assessment include: Age, COPD, General anesthesia  This patient has a low risk of post-operative pulmonary complications by ARISCAT Index.  The absolute assessment of risk/benefit of the procedure is deferred to the primary team's evaluation.  - Patient's Estimated risk of postoperative respiratory failure is low, 3 points based on the ARISCAT Index.   0 to 25 points: Low risk: 1.6% pulmonary complication rate  26 to 44 points: Intermediate risk: 13.3% pulmonary complication rate  45 to 123 points: High risk: 42.1% pulmonary complication rate  Postoperative respiratory failure (PRF) is considered as failure to wean from mechanical ventilation within 48 hours of surgery or unplanned intubation/reintubation postoperatively. The validated risk calculator provides a risk estimate of PRF and is anticipated to aid in surgical decision-making and informed patient consent.  However risk can be accepted given the potential benefit of this intervention and it is not prohibitive.  RECOMMENDATIONS:  In order to minimize the risk of complications and optimize pulmonary status, we recommend the following: - Encourage aggressive incentive spirometry hourly both peri-operatively and post-operatively as tolerated  - Early ambulation and physical therapy as tolerated post-operatively - Adequate pain control especially in the setting of abdominal and thoracic surgery - Bronchodilators as needed for wheezing or shortness of breath - Intraoperatively keep OR time to the shortest as possible   ARISCAT: Mazo et al. Anesthesiology 2014; [phone]  Durel Salts, MD Pulmonary and Critical Care  Medicine [facility] 08/03/2023 11:55 AM Pager: see AMION  If no response to pager, please call critical care on call (see AMION) until 7pm After 7:00 pm call Elink

## 2023-08-04 ENCOUNTER — Encounter (HOSPITAL_COMMUNITY)
Admission: RE | Admit: 2023-08-04 | Discharge: 2023-08-04 | Disposition: A | Discharge status: Home / Self Care | Payer: Medicare HMO | Source: Ambulatory Visit | Attending: Internal Medicine | Admitting: Internal Medicine

## 2023-08-04 DIAGNOSIS — J449 Chronic obstructive pulmonary disease, unspecified: Secondary | ICD-10-CM | POA: Diagnosis not present

## 2023-08-04 DIAGNOSIS — Z87891 Personal history of nicotine dependence: Secondary | ICD-10-CM | POA: Diagnosis not present

## 2023-08-04 NOTE — Progress Notes (Signed)
Daily Session Note  Patient Details  Name: Barbara [name] MRN: [id number] Date of Birth: [birth year] Referring Provider:   Doristine Devoid Pulmonary Rehab Walk Test from 06/03/2023 in [facility], [facility]  Referring Provider [name]       Encounter Date: 08/04/2023  Check In:  Session Check In - 08/04/23 1334       Check-In   Supervising physician immediately available to respond to emergencies [facility] MD immediately available    Physician(s) Carlyon Shadow, NP    [facility]    Staff Present Samantha Belarus, RD, Dutch Gray, RN, BSN;Randi Reeve BS, ACSM-CEP, Exercise Physiologist;Kaylee Earlene Plater, MS, ACSM-CEP, Exercise Physiologist;Deauna Yaw Katrinka Blazing, RT    Virtual Visit No    Medication changes reported     No    Fall or balance concerns reported    No    Tobacco Cessation No Change    Warm-up and Cool-down Performed as group-led instruction    Resistance Training Performed Yes    VAD Patient? No    PAD/SET Patient? No      Pain Assessment   Currently in Pain? No/denies    Multiple Pain Sites No             Capillary Blood Glucose: No results found for this or any previous visit (from the past 24 hour(s)).    Social History   Tobacco Use  Smoking Status Former   Current packs/day: 0.00   Average packs/day: 1 pack/day for 45.0 years (45.0 ttl pk-yrs)   Types: Cigarettes   Start date: 10/26/1966   Quit date: 10/27/2011   Years since quitting: 11.7  Smokeless Tobacco Never    Goals Met:  Proper associated with RPD/PD & O2 Sat Independence with exercise equipment Exercise tolerated well No report of concerns or symptoms today Strength training completed today  Goals Unmet:  Not Applicable  Comments: Service time is from 1310 to 1440.    Dr. Mechele Collin is Medical Director for Pulmonary Rehab at [facility].

## 2023-08-04 NOTE — Progress Notes (Signed)
Home Exercise Prescription I have reviewed a Home Exercise Prescription with Toney Reil. She is currently not exercising but has a bike at home. Discussed with pt exercising on her bike for 30 min 1-2 days a week. She sts she will try it out. Attempted to get on our bike in gym but it was not comfortable for her knees. Will follow up. The patient stated that their goals were to start exercising on her own. We reviewed exercise guidelines, target heart rate during exercise, RPE Scale, weather conditions, endpoints for exercise, warmup and cool down. The patient is encouraged to come to me with any questions. I will continue to follow up with the patient to assist them with progression and safety.  Spent 15 min discussing home exercise plan and goals.  Dejae Bernet [place], [place], ACSM-CEP 08/04/2023 3:40 PM

## 2023-08-06 ENCOUNTER — Encounter (HOSPITAL_COMMUNITY)
Admission: RE | Admit: 2023-08-06 | Discharge: 2023-08-06 | Disposition: A | Discharge status: Home / Self Care | Payer: Medicare HMO | Source: Ambulatory Visit | Attending: Internal Medicine | Admitting: Internal Medicine

## 2023-08-06 DIAGNOSIS — Z87891 Personal history of nicotine dependence: Secondary | ICD-10-CM | POA: Diagnosis not present

## 2023-08-06 DIAGNOSIS — J449 Chronic obstructive pulmonary disease, unspecified: Secondary | ICD-10-CM

## 2023-08-06 NOTE — Progress Notes (Signed)
Daily Session Note  Patient Details  Name: Barbara [name] MRN: [id number] Date of Birth: 1951/09/01 Referring Provider:   Doristine Devoid Pulmonary Rehab Walk Test from 06/03/2023 in [facility]  Referring Provider [name]       Encounter Date: 08/06/2023  Check In:  Session Check In - 08/06/23 1340       Check-In   Supervising physician immediately available to respond to emergencies [facility] MD immediately available    Physician(s) Bernadene Person, NP    [facility]    Staff Present Elissa Lovett BS, ACSM-CEP, Exercise Physiologist;Kaylee Earlene Plater, MS, ACSM-CEP, Exercise Physiologist;Bailee Thall Thedore Mins, RN, BSN    Virtual Visit No    Medication changes reported     No    Fall or balance concerns reported    No    Tobacco Cessation No Change    Warm-up and Cool-down Performed as group-led instruction    Resistance Training Performed Yes    VAD Patient? No    PAD/SET Patient? No      Pain Assessment   Currently in Pain? No/denies    Multiple Pain Sites No             Capillary Blood Glucose: No results found for this or any previous visit (from the past 24 hour(s)).    Social History   Tobacco Use  Smoking Status Former   Current packs/day: 0.00   Average packs/day: 1 pack/day for 45.0 years (45.0 ttl pk-yrs)   Types: Cigarettes   Start date: 10/26/1966   Quit date: 10/27/2011   Years since quitting: 11.7  Smokeless Tobacco Never    Goals Met:  Proper associated with RPD/PD & O2 Sat Independence with exercise equipment Exercise tolerated well No report of concerns or symptoms today Strength training completed today  Goals Unmet:  Not Applicable  Comments: Service time is from 1319 to 1443.    Dr. Mechele Collin is Medical Director for Pulmonary Rehab at [facility].

## 2023-08-11 ENCOUNTER — Encounter (HOSPITAL_COMMUNITY)
Admission: RE | Admit: 2023-08-11 | Discharge: 2023-08-11 | Disposition: A | Discharge status: Home / Self Care | Payer: Medicare HMO | Source: Ambulatory Visit | Attending: Internal Medicine | Admitting: Internal Medicine

## 2023-08-11 DIAGNOSIS — J4489 Other specified chronic obstructive pulmonary disease: Secondary | ICD-10-CM | POA: Diagnosis not present

## 2023-08-11 DIAGNOSIS — Z87891 Personal history of nicotine dependence: Secondary | ICD-10-CM | POA: Diagnosis not present

## 2023-08-11 DIAGNOSIS — Z5189 Encounter for other specified aftercare: Secondary | ICD-10-CM | POA: Diagnosis not present

## 2023-08-11 DIAGNOSIS — J449 Chronic obstructive pulmonary disease, unspecified: Secondary | ICD-10-CM

## 2023-08-12 NOTE — Progress Notes (Signed)
Pulmonary Individual Treatment Plan  Patient Details  Name: Barbara [name] MRN: [id number] Date of Birth: 08/31/1951 Referring Provider:   Doristine Devoid Pulmonary Rehab Walk Test from 06/03/2023 in [facility]  Referring Provider [name]       Initial Encounter Date:  Flowsheet Row Pulmonary Rehab Walk Test from 06/03/2023 in [facility]  Date 06/03/23       Visit Diagnosis: Stage 2 moderate COPD by GOLD classification (HCC)  Patient's Home Medications on Admission:   Current Outpatient Medications:    acetaminophen (TYLENOL) 500 MG tablet, Take 1,000 mg by mouth in the morning and at bedtime., Disp: , Rfl:    albuterol (PROVENTIL HFA;VENTOLIN HFA) 108 (90 BASE) MCG/ACT inhaler, Inhale 2 puffs into the lungs every 6 (six) hours as needed. For shortness of breath, Disp: , Rfl:    albuterol (PROVENTIL) (2.5 MG/3ML) 0.083% nebulizer solution, Take 3 mLs (2.5 mg total) by nebulization every 6 (six) hours as needed. Breathing, Disp: 75 mL, Rfl: 3   amLODipine (NORVASC) 10 MG tablet, Take 10 mg by mouth daily., Disp: , Rfl:    atorvastatin (LIPITOR) 10 MG tablet, Take 10 mg by mouth daily., Disp: , Rfl:    Calcium Carbonate-Vit D-Min (CALCIUM 1200 PO), Take 1 tablet by mouth daily., Disp: , Rfl:    Cholecalciferol (VITAMIN D3) 50 MCG (2000 UT) TABS, Take by mouth., Disp: , Rfl:    Cod Liver Oil CAPS, Take 1 capsule by mouth daily., Disp: , Rfl:    Cyanocobalamin (VITAMIN B 12 PO), Take 250 mcg by mouth daily., Disp: , Rfl:    Dupilumab (DUPIXENT) 300 MG/2ML SOPN, Inject 300 mg into the skin every 14 (fourteen) days., Disp: 12 mL, Rfl: 1   fluticasone-salmeterol (ADVAIR) 500-50 MCG/ACT AEPB, INHALE 1 PUFF INTO THE LUNGS IN THE MORNING AND AT BEDTIME., Disp: 180 each, Rfl: 3   hydrochlorothiazide (HYDRODIURIL) 25 MG tablet, Take 25 mg by mouth daily., Disp: , Rfl:     loratadine (CLARITIN) 10 MG tablet, Take 10 mg by mouth daily. (Patient not taking: Reported on 06/03/2023), Disp: , Rfl:    montelukast (SINGULAIR) 10 MG tablet, TAKE 1 TABLET (10 MG TOTAL) BY MOUTH AT BEDTIME., Disp: 90 tablet, Rfl: 3   montelukast (SINGULAIR) 10 MG tablet, 1 tablet, Disp: , Rfl:    montelukast (SINGULAIR) 10 MG tablet, TAKE 1 TABLET AT BEDTIME (NEED MD APPOINTMENT), Disp: 90 tablet, Rfl: 3   Omega-3 Fatty Acids (FISH OIL) 1200 MG CAPS, Take 1 capsule by mouth daily., Disp: , Rfl:    pantoprazole (PROTONIX) 40 MG tablet, 1 tablet, Disp: , Rfl:    pantoprazole (PROTONIX) 40 MG tablet, TAKE 1 TABLET (40 MG TOTAL) BY MOUTH DAILY., Disp: 90 tablet, Rfl: 1   pantoprazole (PROTONIX) 40 MG tablet, TAKE 1 TABLET (40 MG TOTAL) BY MOUTH DAILY., Disp: 90 tablet, Rfl: 3   Peak Flow Meter DEVI, Dispense peak flow meter for asthma, Disp: 1 each, Rfl: 0   RESTASIS 0.05 % ophthalmic emulsion, 1 drop 2 (two) times daily., Disp: , Rfl:    Spacer/Aero-Holding Chambers DEVI, Use with inhalers as directed., Disp: 1 each, Rfl: 1   SPIRIVA RESPIMAT 2.5 MCG/ACT AERS, , Disp: , Rfl:    Tiotropium Bromide Monohydrate (SPIRIVA RESPIMAT) 2.5 MCG/ACT AERS, Inhale 2 each into the lungs daily., Disp: , Rfl:    traMADol (ULTRAM) 50 MG tablet, Take 50 mg  by mouth every 12 (twelve) hours as needed for moderate pain. (Patient not taking: Reported on 06/03/2023), Disp: , Rfl:    TURMERIC CURCUMIN PO, Take 1 capsule by mouth daily., Disp: , Rfl:   Past Medical History: Past Medical History:  Diagnosis Date   Arthritis    right knee, neck   Asthma    Chronic kidney disease    COPD (chronic obstructive pulmonary disease) (HCC)    Hypertension    Mouth trouble    infection from tooth that was pulled approx. mid Mar. 2013    Tobacco Use: Social History   Tobacco Use  Smoking Status Former   Current packs/day: 0.00   Average packs/day: 1 pack/day for 45.0 years (45.0 ttl pk-yrs)   Types: Cigarettes    Start date: 10/26/1966   Quit date: 10/27/2011   Years since quitting: 11.8  Smokeless Tobacco Never    Labs: Review Flowsheet       Latest Ref Rng & Units 10/21/2011 11/26/2011 03/10/2012  Labs for ITP Cardiac and Pulmonary Rehab  PH, Arterial 7.350 - 7.400 - 7.407  7.411   PCO2 arterial 35.0 - 45.0 mmHg - 38.2  41.3   Bicarbonate 20.0 - 24.0 mEq/L - 23.6  25.7   TCO2 0 - 100 mmol/L 26  21.0  23.0   Acid-base deficit 0.0 - 2.0 mmol/L - 0.3  -  O2 Saturation % - 96.6  92.0     Details            Capillary Blood Glucose: No results found for: "GLUCAP"   Pulmonary Assessment Scores:  Pulmonary Assessment Scores     Row Name 06/03/23 1333         ADL UCSD   ADL Phase Entry     SOB Score total 56       CAT Score   CAT Score 25       mMRC Score   mMRC Score 3             UCSD: Self-administered rating of dyspnea associated with activities of daily living (ADLs) 6-point scale (0 = "not at all" to 5 = "maximal or unable to do because of breathlessness")  Scoring Scores range from 0 to 120.  Minimally important difference is 5 units  CAT: CAT can identify the health impairment of COPD patients and is better correlated with disease progression.  CAT has a scoring range of zero to 40. The CAT score is classified into four groups of low (less than 10), medium (10 - 20), high (21-30) and very high (31-40) based on the impact level of disease on health status. A CAT score over 10 suggests significant symptoms.  A worsening CAT score could be explained by an exacerbation, poor medication adherence, poor inhaler technique, or progression of COPD or comorbid conditions.  CAT MCID is 2 points  mMRC: mMRC (Modified Medical Research Council) Dyspnea Scale is used to assess the degree of baseline functional disability in patients of respiratory disease due to dyspnea. No minimal important difference is established. A decrease in score of 1 point or greater is considered a  positive change.   Pulmonary Function Assessment:  Pulmonary Function Assessment - 06/03/23 1332       Breath   Bilateral Breath Sounds Clear    Shortness of Breath Yes             Exercise Target Goals: Exercise Program Goal: Individual exercise prescription set using results from initial 6 min walk  test and THRR while considering  patient's activity barriers and safety.   Exercise Prescription Goal: Initial exercise prescription builds to 30-45 minutes a day of aerobic activity, 2-3 days per week.  Home exercise guidelines will be given to patient during program as part of exercise prescription that the participant will acknowledge.  Activity Barriers & Risk Stratification:  Activity Barriers & Cardiac Risk Stratification - 06/03/23 1326       Activity Barriers & Cardiac Risk Stratification   Activity Barriers Deconditioning;Muscular Weakness;Shortness of Breath;Arthritis    Cardiac Risk Stratification Low             6 Minute Walk:  6 Minute Walk     Row Name 06/03/23 1421         6 Minute Walk   Phase Initial     Distance 812 feet     Walk Time 6 minutes     # of Rest Breaks 0     MPH 1.54     METS 1.56     RPE 11     Perceived Dyspnea  1     VO2 Peak 5.47     Symptoms Yes (comment)     Comments fatigue/SOB     Resting HR 57 bpm     Resting BP 114/60     Resting Oxygen Saturation  96 %     Exercise Oxygen Saturation  during 6 min walk 89 %     Max Ex. HR 88 bpm     Max Ex. BP 136/80     2 Minute Post BP 106/60       Interval HR   1 Minute HR 73     2 Minute HR 88     3 Minute HR 88     4 Minute HR 88     5 Minute HR 85     6 Minute HR 87     2 Minute Post HR 71     Interval Heart Rate? Yes       Interval Oxygen   Interval Oxygen? Yes     Baseline Oxygen Saturation % 96 %     1 Minute Oxygen Saturation % 93 %     1 Minute Liters of Oxygen 0 L     2 Minute Oxygen Saturation % 91 %     2 Minute Liters of Oxygen 0 L     3 Minute Oxygen  Saturation % 91 %     3 Minute Liters of Oxygen 0 L     4 Minute Oxygen Saturation % 90 %     4 Minute Liters of Oxygen 0 L     5 Minute Oxygen Saturation % 93 %     5 Minute Liters of Oxygen 0 L     6 Minute Oxygen Saturation % 89 %     6 Minute Liters of Oxygen 0 L     2 Minute Post Oxygen Saturation % 97 %     2 Minute Post Liters of Oxygen 0 L              Oxygen Initial Assessment:  Oxygen Initial Assessment - 06/03/23 1328       Home Oxygen   Home Oxygen Device None    Sleep Oxygen Prescription None    Home Exercise Oxygen Prescription None    Home Resting Oxygen Prescription None      Initial 6 min Walk   Oxygen Used None  Program Oxygen Prescription   Program Oxygen Prescription None      Intervention   Short Term Goals To learn and understand importance of maintaining oxygen saturations>88%;To learn and demonstrate proper use of respiratory medications;To learn and understand importance of monitoring SPO2 with pulse oximeter and demonstrate accurate use of the pulse oximeter.;To learn and demonstrate proper pursed lip breathing techniques or other breathing techniques.     Long  Term Goals Maintenance of O2 saturations>88%;Compliance with respiratory medication;Verbalizes importance of monitoring SPO2 with pulse oximeter and return demonstration;Exhibits proper breathing techniques, such as pursed lip breathing or other method taught during program session;Demonstrates proper use of MDI's             Oxygen Re-Evaluation:  Oxygen Re-Evaluation     Row Name 06/10/23 1528 07/13/23 1231 08/06/23 1558         Program Oxygen Prescription   Program Oxygen Prescription None None None       Home Oxygen   Home Oxygen Device None None None     Sleep Oxygen Prescription None None None     Home Exercise Oxygen Prescription None None None     Home Resting Oxygen Prescription None None None       Goals/Expected Outcomes   Short Term Goals To learn and  understand importance of maintaining oxygen saturations>88%;To learn and demonstrate proper use of respiratory medications;To learn and understand importance of monitoring SPO2 with pulse oximeter and demonstrate accurate use of the pulse oximeter.;To learn and demonstrate proper pursed lip breathing techniques or other breathing techniques.  To learn and understand importance of maintaining oxygen saturations>88%;To learn and demonstrate proper use of respiratory medications;To learn and understand importance of monitoring SPO2 with pulse oximeter and demonstrate accurate use of the pulse oximeter.;To learn and demonstrate proper pursed lip breathing techniques or other breathing techniques.  To learn and understand importance of maintaining oxygen saturations>88%;To learn and demonstrate proper use of respiratory medications;To learn and understand importance of monitoring SPO2 with pulse oximeter and demonstrate accurate use of the pulse oximeter.;To learn and demonstrate proper pursed lip breathing techniques or other breathing techniques.      Long  Term Goals Maintenance of O2 saturations>88%;Compliance with respiratory medication;Verbalizes importance of monitoring SPO2 with pulse oximeter and return demonstration;Exhibits proper breathing techniques, such as pursed lip breathing or other method taught during program session;Demonstrates proper use of MDI's Maintenance of O2 saturations>88%;Compliance with respiratory medication;Verbalizes importance of monitoring SPO2 with pulse oximeter and return demonstration;Exhibits proper breathing techniques, such as pursed lip breathing or other method taught during program session;Demonstrates proper use of MDI's Maintenance of O2 saturations>88%;Compliance with respiratory medication;Verbalizes importance of monitoring SPO2 with pulse oximeter and return demonstration;Exhibits proper breathing techniques, such as pursed lip breathing or other method taught during  program session;Demonstrates proper use of MDI's     Goals/Expected Outcomes Compliance and understanding of oxygen saturations monitoring and breathing techniques to decrease shortness of breath. Compliance and understanding of oxygen saturations monitoring and breathing techniques to decrease shortness of breath. Compliance and understanding of oxygen saturations monitoring and breathing techniques to decrease shortness of breath.              Oxygen Discharge (Final Oxygen Re-Evaluation):  Oxygen Re-Evaluation - 08/06/23 1558       Program Oxygen Prescription   Program Oxygen Prescription None      Home Oxygen   Home Oxygen Device None    Sleep Oxygen Prescription None    Home Exercise Oxygen Prescription None  Home Resting Oxygen Prescription None      Goals/Expected Outcomes   Short Term Goals To learn and understand importance of maintaining oxygen saturations>88%;To learn and demonstrate proper use of respiratory medications;To learn and understand importance of monitoring SPO2 with pulse oximeter and demonstrate accurate use of the pulse oximeter.;To learn and demonstrate proper pursed lip breathing techniques or other breathing techniques.     Long  Term Goals Maintenance of O2 saturations>88%;Compliance with respiratory medication;Verbalizes importance of monitoring SPO2 with pulse oximeter and return demonstration;Exhibits proper breathing techniques, such as pursed lip breathing or other method taught during program session;Demonstrates proper use of MDI's    Goals/Expected Outcomes Compliance and understanding of oxygen saturations monitoring and breathing techniques to decrease shortness of breath.             Initial Exercise Prescription:  Initial Exercise Prescription - 06/03/23 1400       Date of Initial Exercise RX and Referring Provider   Date 06/03/23    Referring Provider [name]    Expected Discharge Date 08/27/23      NuStep   Level 2    SPM 60     Minutes 15    METs 1.5      Track   Minutes 15    METs 1.5      Prescription Details   Frequency (times per week) 2    Duration Progress to 30 minutes of continuous aerobic without signs/symptoms of physical distress      Intensity   THRR 40-80% of Max Heartrate 58-117    Ratings of Perceived Exertion 11-13    Perceived Dyspnea 0-4      Progression   Progression Continue progressive overload as per policy without signs/symptoms or physical distress.      Resistance Training   Training Prescription Yes    Weight blue bands    Reps 10-15             Perform Capillary Blood Glucose checks as needed.  Exercise Prescription Changes:   Exercise Prescription Changes     Row Name 06/16/23 1500 06/30/23 1400 07/14/23 1400 07/28/23 1400 08/04/23 1500     Response to Exercise   Blood Pressure (Admit) 100/62 116/64 112/66 126/70 --   Blood Pressure (Exercise) 126/60 130/72 140/70 120/64 --   Blood Pressure (Exit) 110/70 102/70 100/66 92/58 --   Heart Rate (Admit) 67 bpm 57 bpm 54 bpm 58 bpm --   Heart Rate (Exercise) 82 bpm 87 bpm 65 bpm 77 bpm --   Heart Rate (Exit) 63 bpm 64 bpm 54 bpm 54 bpm --   Oxygen Saturation (Admit) 97 % 96 % 96 % 97 % --   Oxygen Saturation (Exercise) 95 % 96 % 97 % 95 % --   Oxygen Saturation (Exit) 97 % 95 % 97 % 96 % --   Rating of Perceived Exertion (Exercise) 12 13 11 13  --   Perceived Dyspnea (Exercise) 1 2 1 3  --   Duration Progress to 30 minutes of  aerobic without signs/symptoms of physical distress Continue with 30 min of aerobic exercise without signs/symptoms of physical distress. Continue with 30 min of aerobic exercise without signs/symptoms of physical distress. Continue with 30 min of aerobic exercise without signs/symptoms of physical distress. --   Intensity THRR unchanged THRR unchanged THRR unchanged THRR unchanged --     Progression   Progression Continue to progress workloads to maintain intensity without signs/symptoms of  physical distress. Continue to progress workloads to  maintain intensity without signs/symptoms of physical distress. Continue to progress workloads to maintain intensity without signs/symptoms of physical distress. Continue to progress workloads to maintain intensity without signs/symptoms of physical distress. --     Paramedic Prescription Yes Yes Yes Yes --   Weight blue bands blue bands blue bands blue bands --   Reps 10-15 10-15 10-15 10-15 --   Time 10 Minutes 10 Minutes 10 Minutes 10 Minutes --     NuStep   Level 2 5 5 5  --   SPM 72 -- -- -- --   Minutes 15 15 15 15  --   METs 2 2.5 2.8 3 --     Track   Laps -- 12 12 13  --   Minutes 12 15 15 15  --   METs 2.85 2.85 2.85 3 --     Home Exercise Plan   Plans to continue exercise at -- -- -- -- Home (comment)  stationary bike   Frequency -- -- -- -- Add 1 additional day to program exercise sessions.   Initial Home Exercises Provided -- -- -- -- 08/04/23            Exercise Comments:   Exercise Comments     Row Name 06/09/23 1610 08/04/23 1536         Exercise Comments Pt completed first day of group exercise. She exercised on the recumbent stepper for 15 min, level 2, METs 1.5. Pt then walked on the track for 11 laps, METs 2.69, for 15 min. Tolerated well. Discussed METs. She performed leg extentions instead of squats due to knee pain. Discussed with pt home exercise plan. She is currently not exercising but has a bike at home. Discussed with pt exercising on her bike for 30 min 1-2 days a week. She sts she will try it out. Attempted to get on our bike in gym but it was not comfortable for her knees.               Exercise Goals and Review:   Exercise Goals     Row Name 06/03/23 1327 06/10/23 1525 07/13/23 1215 08/06/23 1556       Exercise Goals   Increase Physical Activity Yes Yes Yes Yes    Intervention Provide advice, education, support and counseling about physical activity/exercise  needs.;Develop an individualized exercise prescription for aerobic and resistive training based on initial evaluation findings, risk stratification, comorbidities and participant's personal goals. Provide advice, education, support and counseling about physical activity/exercise needs.;Develop an individualized exercise prescription for aerobic and resistive training based on initial evaluation findings, risk stratification, comorbidities and participant's personal goals. Provide advice, education, support and counseling about physical activity/exercise needs.;Develop an individualized exercise prescription for aerobic and resistive training based on initial evaluation findings, risk stratification, comorbidities and participant's personal goals. Provide advice, education, support and counseling about physical activity/exercise needs.;Develop an individualized exercise prescription for aerobic and resistive training based on initial evaluation findings, risk stratification, comorbidities and participant's personal goals.    Expected Outcomes Short Term: Attend rehab on a regular basis to increase amount of physical activity.;Long Term: Exercising regularly at least 3-5 days a week.;Long Term: Add in home exercise to make exercise part of routine and to increase amount of physical activity. Short Term: Attend rehab on a regular basis to increase amount of physical activity.;Long Term: Exercising regularly at least 3-5 days a week.;Long Term: Add in home exercise to make exercise part of routine and to increase amount  of physical activity. Short Term: Attend rehab on a regular basis to increase amount of physical activity.;Long Term: Exercising regularly at least 3-5 days a week.;Long Term: Add in home exercise to make exercise part of routine and to increase amount of physical activity. Short Term: Attend rehab on a regular basis to increase amount of physical activity.;Long Term: Exercising regularly at least 3-5  days a week.;Long Term: Add in home exercise to make exercise part of routine and to increase amount of physical activity.    Increase Strength and Stamina Yes Yes Yes Yes    Intervention Provide advice, education, support and counseling about physical activity/exercise needs.;Develop an individualized exercise prescription for aerobic and resistive training based on initial evaluation findings, risk stratification, comorbidities and participant's personal goals. Provide advice, education, support and counseling about physical activity/exercise needs.;Develop an individualized exercise prescription for aerobic and resistive training based on initial evaluation findings, risk stratification, comorbidities and participant's personal goals. Provide advice, education, support and counseling about physical activity/exercise needs.;Develop an individualized exercise prescription for aerobic and resistive training based on initial evaluation findings, risk stratification, comorbidities and participant's personal goals. Provide advice, education, support and counseling about physical activity/exercise needs.;Develop an individualized exercise prescription for aerobic and resistive training based on initial evaluation findings, risk stratification, comorbidities and participant's personal goals.    Expected Outcomes Short Term: Increase workloads from initial exercise prescription for resistance, speed, and METs.;Short Term: Perform resistance training exercises routinely during rehab and add in resistance training at home;Long Term: Improve cardiorespiratory fitness, muscular endurance and strength as measured by increased METs and functional capacity ( ) Short Term: Increase workloads from initial exercise prescription for resistance, speed, and METs.;Short Term: Perform resistance training exercises routinely during rehab and add in resistance training at home;Long Term: Improve cardiorespiratory fitness, muscular  endurance and strength as measured by increased METs and functional capacity ( ) Short Term: Increase workloads from initial exercise prescription for resistance, speed, and METs.;Short Term: Perform resistance training exercises routinely during rehab and add in resistance training at home;Long Term: Improve cardiorespiratory fitness, muscular endurance and strength as measured by increased METs and functional capacity ( ) Short Term: Increase workloads from initial exercise prescription for resistance, speed, and METs.;Short Term: Perform resistance training exercises routinely during rehab and add in resistance training at home;Long Term: Improve cardiorespiratory fitness, muscular endurance and strength as measured by increased METs and functional capacity ( )    Able to understand and use rate of perceived exertion (RPE) scale Yes Yes Yes Yes    Intervention Provide education and explanation on how to use RPE scale Provide education and explanation on how to use RPE scale Provide education and explanation on how to use RPE scale Provide education and explanation on how to use RPE scale    Expected Outcomes Short Term: Able to use RPE daily in rehab to express subjective intensity level;Long Term:  Able to use RPE to guide intensity level when exercising independently Short Term: Able to use RPE daily in rehab to express subjective intensity level;Long Term:  Able to use RPE to guide intensity level when exercising independently Short Term: Able to use RPE daily in rehab to express subjective intensity level;Long Term:  Able to use RPE to guide intensity level when exercising independently Short Term: Able to use RPE daily in rehab to express subjective intensity level;Long Term:  Able to use RPE to guide intensity level when exercising independently    Able to understand and use Dyspnea scale Yes Yes Yes Yes  Intervention Provide education and explanation on how to use Dyspnea scale Provide  education and explanation on how to use Dyspnea scale Provide education and explanation on how to use Dyspnea scale Provide education and explanation on how to use Dyspnea scale    Expected Outcomes Short Term: Able to use Dyspnea scale daily in rehab to express subjective sense of shortness of breath during exertion;Long Term: Able to use Dyspnea scale to guide intensity level when exercising independently Short Term: Able to use Dyspnea scale daily in rehab to express subjective sense of shortness of breath during exertion;Long Term: Able to use Dyspnea scale to guide intensity level when exercising independently Short Term: Able to use Dyspnea scale daily in rehab to express subjective sense of shortness of breath during exertion;Long Term: Able to use Dyspnea scale to guide intensity level when exercising independently Short Term: Able to use Dyspnea scale daily in rehab to express subjective sense of shortness of breath during exertion;Long Term: Able to use Dyspnea scale to guide intensity level when exercising independently    Knowledge and understanding of Target Heart Rate Range (THRR) Yes Yes Yes Yes    Intervention Provide education and explanation of THRR including how the numbers were predicted and where they are located for reference Provide education and explanation of THRR including how the numbers were predicted and where they are located for reference Provide education and explanation of THRR including how the numbers were predicted and where they are located for reference Provide education and explanation of THRR including how the numbers were predicted and where they are located for reference    Expected Outcomes Short Term: Able to state/look up THRR;Long Term: Able to use THRR to govern intensity when exercising independently;Short Term: Able to use daily as guideline for intensity in rehab Short Term: Able to state/look up THRR;Long Term: Able to use THRR to govern intensity when  exercising independently;Short Term: Able to use daily as guideline for intensity in rehab Short Term: Able to state/look up THRR;Long Term: Able to use THRR to govern intensity when exercising independently;Short Term: Able to use daily as guideline for intensity in rehab Short Term: Able to state/look up THRR;Long Term: Able to use THRR to govern intensity when exercising independently;Short Term: Able to use daily as guideline for intensity in rehab    Understanding of Exercise Prescription Yes Yes Yes Yes    Intervention Provide education, explanation, and written materials on patient's individual exercise prescription Provide education, explanation, and written materials on patient's individual exercise prescription Provide education, explanation, and written materials on patient's individual exercise prescription Provide education, explanation, and written materials on patient's individual exercise prescription    Expected Outcomes Short Term: Able to explain program exercise prescription;Long Term: Able to explain home exercise prescription to exercise independently Short Term: Able to explain program exercise prescription;Long Term: Able to explain home exercise prescription to exercise independently Short Term: Able to explain program exercise prescription;Long Term: Able to explain home exercise prescription to exercise independently Short Term: Able to explain program exercise prescription;Long Term: Able to explain home exercise prescription to exercise independently             Exercise Goals Re-Evaluation :  Exercise Goals Re-Evaluation     Row Name 06/10/23 1525 07/13/23 1215 08/06/23 1556         Exercise Goal Re-Evaluation   Exercise Goals Review Increase Physical Activity;Able to understand and use Dyspnea scale;Understanding of Exercise Prescription;Increase Strength and Stamina;Knowledge and understanding of Target  Heart Rate Range (THRR);Able to understand and use rate of  perceived exertion (RPE) scale Increase Physical Activity;Able to understand and use Dyspnea scale;Understanding of Exercise Prescription;Increase Strength and Stamina;Knowledge and understanding of Target Heart Rate Range (THRR);Able to understand and use rate of perceived exertion (RPE) scale Increase Physical Activity;Able to understand and use Dyspnea scale;Understanding of Exercise Prescription;Increase Strength and Stamina;Knowledge and understanding of Target Heart Rate Range (THRR);Able to understand and use rate of perceived exertion (RPE) scale     Comments Pt has completed one day of group exercise. She exercised on the recumbent stepper for 15 min, level 2, METs 1.5. Pt then walked on the track for 11 laps, METs 2.69, for 15 min. Tolerated well. She performed leg extentions instead of squats due to knee pain. Will progress as tolerated. Pt has completed 9 days of group exercise. She exercised on the recumbent stepper for 15 min, level 5, METs 3.2. Pt then is walking on the track for 1 laps, METs 2.85, for 15 min. Tolerated well. She is working on squats. Progressing well. Pt has completed 16 days of group exercise. She is exercising on the recumbent stepper for 15 min, level 6, METs 3.2. Pt then is walking on the track for 15 laps, METs 3.31, for 15 min. She tried the treadmill but became dizzy. She is pushing herself on the track.  Progressing well.     Expected Outcomes Through exercise at rehab and at home, the patient will decrease shortness of breath with daily activities and feel confident in carrying out an exercise regime at home. Through exercise at rehab and at home, the patient will decrease shortness of breath with daily activities and feel confident in carrying out an exercise regime at home. Through exercise at rehab and at home, the patient will decrease shortness of breath with daily activities and feel confident in carrying out an exercise regime at home.              Discharge  Exercise Prescription (Final Exercise Prescription Changes):  Exercise Prescription Changes - 08/04/23 1500       Home Exercise Plan   Plans to continue exercise at Home (comment)   stationary bike   Frequency Add 1 additional day to program exercise sessions.    Initial Home Exercises Provided 08/04/23             Nutrition:  Target Goals: Understanding of nutrition guidelines, daily intake of sodium 1500mg , cholesterol 200mg , calories 30% from fat and 7% or less from saturated fats, daily to have 5 or more servings of fruits and vegetables.  Biometrics:  Pre Biometrics - 06/03/23 1425       Pre Biometrics   Grip Strength 34 kg              Nutrition Therapy Plan and Nutrition Goals:  Nutrition Therapy & Goals - 08/11/23 1408       Nutrition Therapy   Diet Heart healthy diet    Drug/Food Interactions Statins/Certain Fruits      Personal Nutrition Goals   Nutrition Goal Patient to improve diet quality by using the plate method as a guide for meal planning to include lean protein/plant protein, fruits, vegetables, whole grains, nonfat dairy as part of a well-balanced diet.   goal in progress.   Personal Goal #2 Patient to limit sodium to 2300mg  per day   goal in progress.   Comments Goals in progress. Jaylea continues to monitor sodium intake. Reviewed strategies for mindful eating  to aid with weight maintenance/weight loss. Patient is down 1.5# since starting with our program. Patient will benefit from participation in pulmonary rehab for nutrition, exercise, and lifestyle modification.      Intervention Plan   Intervention Prescribe, educate and counsel regarding individualized specific dietary modifications aiming towards targeted core components such as weight, hypertension, lipid management, diabetes, heart failure and other comorbidities.    Expected Outcomes Short Term Goal: Understand basic principles of dietary content, such as calories, fat, sodium,  cholesterol and nutrients.;Long Term Goal: Adherence to prescribed nutrition plan.             Nutrition Assessments:  Nutrition Assessments - 06/12/23 0810       Rate Your Plate Scores   Pre Score 66            MEDIFICTS Score Key: ?70 Need to make dietary changes  40-70 Heart Healthy Diet ? 40 Therapeutic Level Cholesterol Diet  Flowsheet Row PULMONARY REHAB CHRONIC OBSTRUCTIVE PULMONARY DISEASE from 06/09/2023 in [facility]  Picture Your Plate Total Score on Admission 66      Picture Your Plate Scores: <29 Unhealthy dietary pattern with much room for improvement. 41-50 Dietary pattern unlikely to meet recommendations for good health and room for improvement. 51-60 More healthful dietary pattern, with some room for improvement.  >60 Healthy dietary pattern, although there may be some specific behaviors that could be improved.    Nutrition Goals Re-Evaluation:  Nutrition Goals Re-Evaluation     Row Name 06/09/23 1606 07/07/23 1452 08/11/23 1408         Goals   Current Weight 181 lb 14.1 oz (82.5 kg) 179 lb 7.3 oz (81.4 kg) 180 lb 1.9 oz (81.7 kg)     Comment labs not available from outside provider labs not available from outside provider triglycerides 191, GFR 51     Expected Outcome Shandora works part-time. She reports no nutrition questions/concerns at this time. Patient will benefit from participation in pulmonary rehab for nutrition, exercise, and lifestyle modification. She reports no nutrition questions/concerns at this time. Patient is down 2.2# since starting with our program. Patient will benefit from participation in pulmonary rehab for nutrition, exercise, and lifestyle modification. Goals in progress. Roxxanne continues to monitor sodium intake. Reviewed strategies for mindful eating to aid with weight maintenance/weight loss. Patient is down 1.5# since starting with our program. Patient will benefit  from participation in pulmonary rehab for nutrition, exercise, and lifestyle modification.              Nutrition Goals Discharge (Final Nutrition Goals Re-Evaluation):  Nutrition Goals Re-Evaluation - 08/11/23 1408       Goals   Current Weight 180 lb 1.9 oz (81.7 kg)    Comment triglycerides 191, GFR 51    Expected Outcome Goals in progress. Marliss continues to monitor sodium intake. Reviewed strategies for mindful eating to aid with weight maintenance/weight loss. Patient is down 1.5# since starting with our program. Patient will benefit from participation in pulmonary rehab for nutrition, exercise, and lifestyle modification.             Psychosocial: Target Goals: Acknowledge presence or absence of significant depression and/or stress, maximize coping skills, provide positive support system. Participant is able to verbalize types and ability to use techniques and skills needed for reducing stress and depression.  Initial Review & Psychosocial Screening:  Initial Psych Review & Screening - 06/03/23 1319  Initial Review   Current issues with Current Stress Concerns    Source of Stress Concerns Family    Comments Lorah expresses she has stress due to being her brothers healthcare power of attorney. He has a brain injury and lives in an assisted living facility. Soila washes his clothes weekly and goes with him to all of his appointments.      Family Dynamics   Good Support System? Yes      Barriers   Psychosocial barriers to participate in program The patient should benefit from training in stress management and relaxation.      Screening Interventions   Interventions Encouraged to exercise;To provide support and resources with identified psychosocial needs    Expected Outcomes Short Term goal: Utilizing psychosocial counselor, staff and physician to assist with identification of specific Stressors or current issues interfering with healing process. Setting  desired goal for each stressor or current issue identified.;Long Term Goal: Stressors or current issues are controlled or eliminated.             Quality of Life Scores:  Scores of 19 and below usually indicate a poorer quality of life in these areas.  A difference of  2-3 points is a clinically meaningful difference.  A difference of 2-3 points in the total score of the Quality of Life Index has been associated with significant improvement in overall quality of life, self-image, physical symptoms, and general health in studies assessing change in quality of life.  PHQ-9: Review Flowsheet       06/03/2023  Depression screen PHQ 2/9  Decreased Interest 0  Down, Depressed, Hopeless 0  PHQ - 2 Score 0  Altered sleeping 0  Tired, decreased energy 0  Change in appetite 0  Feeling bad or failure about yourself  0  Trouble concentrating 0  Moving slowly or fidgety/restless 0  Suicidal thoughts 0  PHQ-9 Score 0  Difficult doing work/chores Not difficult at all    Details           Interpretation of Total Score  Total Score Depression Severity:  1-4 = Minimal depression, 5-9 = Mild depression, 10-14 = Moderate depression, 15-19 = Moderately severe depression, 20-27 = Severe depression   Psychosocial Evaluation and Intervention:  Psychosocial Evaluation - 06/03/23 1320       Psychosocial Evaluation & Interventions   Interventions Encouraged to exercise with the program and follow exercise prescription;Stress management education    Comments Cheronda expresses she has stress due to being her brothers healthcare power of attorney. He has a brain injury and lives in an assisted living facility. Sameen washes his clothes weekly and goes with him to all of his appointments.    Expected Outcomes For Cindie to participate in PR free of any psychosocial barriers or concerns    Continue Psychosocial Services  No Follow up required             Psychosocial Re-Evaluation:   Psychosocial Re-Evaluation     Row Name 06/12/23 1056 07/10/23 1102 08/05/23 1459         Psychosocial Re-Evaluation   Current issues with Current Stress Concerns Current Stress Concerns Current Stress Concerns     Comments No changes since orientation. Amy-Lee has been to 1 class. Genna continues to be the primary caregiver for her brother, who has suffered a brain injury. She visits him at least daily, washes his clothes, takes and assists him with all appointments and makes sure he is getting excellent care at  his skilled nursing facility. She is consistently putting her brother's needs before her own. She has been educated, provided, and we have practiced in class stress reducing techniques. Yazleen currently declines assistance finding a mental health professional. Jeanina states that her mental health is stable at this time. She enjoys coming to class and states it is helping with her stress levels. She still denies a referral for a mental health specialist.     Expected Outcomes For Letha to continue to attend pulmonary rehab and to have a positive outlook and good coping skills to manage her stress. For Saige to continue to attend pulmonary rehab and to have a positive outlook and good coping skills to manage her stress. For Aubreanna to continue to attend pulmonary rehab and to have a positive outlook and good coping skills to manage her stress.     Interventions Encouraged to attend Pulmonary Rehabilitation for the exercise;Stress management education;Relaxation education Encouraged to attend Pulmonary Rehabilitation for the exercise;Stress management education;Relaxation education Encouraged to attend Pulmonary Rehabilitation for the exercise     Continue Psychosocial Services  Follow up required by staff No Follow up required No Follow up required              Psychosocial Discharge (Final Psychosocial Re-Evaluation):  Psychosocial Re-Evaluation - 08/05/23 1459        Psychosocial Re-Evaluation   Current issues with Current Stress Concerns    Comments Khaniya states that her mental health is stable at this time. She enjoys coming to class and states it is helping with her stress levels. She still denies a referral for a mental health specialist.    Expected Outcomes For Uriel to continue to attend pulmonary rehab and to have a positive outlook and good coping skills to manage her stress.    Interventions Encouraged to attend Pulmonary Rehabilitation for the exercise    Continue Psychosocial Services  No Follow up required             Education: Education Goals: Education classes will be provided on a weekly basis, covering required topics. Participant will state understanding/return demonstration of topics presented.  Learning Barriers/Preferences:  Learning Barriers/Preferences - 06/03/23 1325       Learning Barriers/Preferences   Learning Barriers Sight    Learning Preferences Group Instruction;Skilled Demonstration             Education Topics: Introduction to Pulmonary Rehab Group instruction provided by PowerPoint, verbal discussion, and written material to support subject matter. Instructor reviews what Pulmonary Rehab is, the purpose of the program, and how patients are referred.     Know Your Numbers Group instruction that is supported by a PowerPoint presentation. Instructor discusses importance of knowing and understanding resting, exercise, and post-exercise oxygen saturation, heart rate, and blood pressure. Oxygen saturation, heart rate, blood pressure, rating of perceived exertion, and dyspnea are reviewed along with a normal range for these values.  Flowsheet Row PULMONARY REHAB CHRONIC OBSTRUCTIVE PULMONARY DISEASE from 06/18/2023 in [facility] Heart, Vascular, & Lung [facility]  Date 06/18/23  Educator EP  Instruction Review Code 1- Verbalizes Understanding       Exercise for the Pulmonary  Patient Group instruction that is supported by a PowerPoint presentation. Instructor discusses benefits of exercise, core components of exercise, frequency, duration, and intensity of an exercise routine, importance of utilizing pulse oximetry during exercise, safety while exercising, and options of places to exercise outside of rehab.       MET Level  Group instruction provided by PowerPoint, verbal discussion, and written material to support subject matter. Instructor reviews what METs are and how to increase METs.  Flowsheet Row PULMONARY REHAB CHRONIC OBSTRUCTIVE PULMONARY DISEASE from 08/06/2023 in [facility] for Heart, Vascular, & Lung Health  Date 08/06/23  Educator EP  Instruction Review Code 1- Verbalizes Understanding       Pulmonary Medications Verbally interactive group education provided by instructor with focus on inhaled medications and proper administration.   Anatomy and Physiology of the Respiratory System Group instruction provided by PowerPoint, verbal discussion, and written material to support subject matter. Instructor reviews respiratory cycle and anatomical components of the respiratory system and their functions. Instructor also reviews differences in obstructive and restrictive respiratory diseases with examples of each.    Oxygen Safety Group instruction provided by PowerPoint, verbal discussion, and written material to support subject matter. There is an overview of "What is Oxygen" and "Why do we need it".  Instructor also reviews how to create a safe environment for oxygen use, the importance of using oxygen as prescribed, and the risks of noncompliance. There is a brief discussion on traveling with oxygen and resources the patient may utilize. Flowsheet Row PULMONARY REHAB CHRONIC OBSTRUCTIVE PULMONARY DISEASE from 06/25/2023 in [facility] for Heart, Vascular, & Lung Health  Date 06/25/23  Educator RN   Instruction Review Code 1- Verbalizes Understanding       Oxygen Use Group instruction provided by PowerPoint, verbal discussion, and written material to discuss how supplemental oxygen is prescribed and different types of oxygen supply systems. Resources for more information are provided.  Flowsheet Row PULMONARY REHAB CHRONIC OBSTRUCTIVE PULMONARY DISEASE from 07/02/2023 in [facility] for Heart, Vascular, & Lung Health  Date 07/02/23  Educator Baird Lyons, RT  Instruction Review Code 1- Verbalizes Understanding       Breathing Techniques Group instruction that is supported by demonstration and informational handouts. Instructor discusses the benefits of pursed lip and diaphragmatic breathing and detailed demonstration on how to perform both.  Flowsheet Row PULMONARY REHAB CHRONIC OBSTRUCTIVE PULMONARY DISEASE from 07/09/2023 in [facility] for Heart, Vascular, & Lung Health  Date 07/09/23  Educator RN  Instruction Review Code 1- Verbalizes Understanding        Risk Factor Reduction Group instruction that is supported by a PowerPoint presentation. Instructor discusses the definition of a risk factor, different risk factors for pulmonary disease, and how the heart and lungs work together. Flowsheet Row PULMONARY REHAB CHRONIC OBSTRUCTIVE PULMONARY DISEASE from 07/30/2023 in [facility] for Heart, Vascular, & Lung Health  Date 07/30/23  Educator EP  Instruction Review Code 1- Verbalizes Understanding       MD Day A group question and answer session with a medical doctor that allows participants to ask questions that relate to their pulmonary disease state.   Nutrition for the Pulmonary Patient Group instruction provided by PowerPoint slides, verbal discussion, and written materials to support subject matter. The instructor gives an explanation and review of healthy diet recommendations, which includes a discussion  on weight management, recommendations for fruit and vegetable consumption, as well as protein, fluid, caffeine, fiber, sodium, sugar, and alcohol. Tips for eating when patients are short of breath are discussed.    Other Education Group or individual verbal, written, or video instructions that support the educational goals of the pulmonary rehab program. Flowsheet Row PULMONARY REHAB CHRONIC OBSTRUCTIVE PULMONARY DISEASE from 07/23/2023 in  [facility] for Heart, Vascular, & Lung Health  Date 07/23/23  Educator RN  Instruction Review Code 1- Verbalizes Understanding        Knowledge Questionnaire Score:  Knowledge Questionnaire Score - 06/03/23 1334       Knowledge Questionnaire Score   Pre Score 17/18             Core Components/Risk Factors/Patient Goals at Admission:  Personal Goals and Risk Factors at Admission - 06/03/23 1325       Core Components/Risk Factors/Patient Goals on Admission    Weight Management Weight Loss    Improve shortness of breath with ADL's Yes    Intervention Provide education, individualized exercise plan and daily activity instruction to help decrease symptoms of SOB with activities of daily living.    Expected Outcomes Short Term: Improve cardiorespiratory fitness to achieve a reduction of symptoms when performing ADLs;Long Term: Be able to perform more ADLs without symptoms or delay the onset of symptoms    Increase knowledge of respiratory medications and ability to use respiratory devices properly  Yes    Intervention Provide education and demonstration as needed of appropriate use of medications, inhalers, and oxygen therapy.    Expected Outcomes Short Term: Achieves understanding of medications use. Understands that oxygen is a medication prescribed by physician. Demonstrates appropriate use of inhaler and oxygen therapy.;Long Term: Maintain appropriate use of medications, inhalers, and oxygen therapy.    Stress Yes     Intervention Offer individual and/or small group education and counseling on adjustment to heart disease, stress management and health-related lifestyle change. Teach and support self-help strategies.;Refer participants experiencing significant psychosocial distress to appropriate mental health specialists for further evaluation and treatment. When possible, include family members and significant others in education/counseling sessions.    Expected Outcomes Short Term: Participant demonstrates changes in health-related behavior, relaxation and other stress management skills, ability to obtain effective social support, and compliance with psychotropic medications if prescribed.;Long Term: Emotional wellbeing is indicated by absence of clinically significant psychosocial distress or social isolation.             Core Components/Risk Factors/Patient Goals Review:   Goals and Risk Factor Review     Row Name 06/12/23 1058 07/10/23 1108 08/05/23 1503         Core Components/Risk Factors/Patient Goals Review   Personal Goals Review Weight Management/Obesity;Improve shortness of breath with ADL's;Develop more efficient breathing techniques such as purse lipped breathing and diaphragmatic breathing and practicing self-pacing with activity.;Increase knowledge of respiratory medications and ability to use respiratory devices properly.;Stress Weight Management/Obesity;Improve shortness of breath with ADL's;Develop more efficient breathing techniques such as purse lipped breathing and diaphragmatic breathing and practicing self-pacing with activity.;Increase knowledge of respiratory medications and ability to use respiratory devices properly.;Stress Weight Management/Obesity;Improve shortness of breath with ADL's;Stress     Review Goal in progress for weight loss. Jwan has completed 1 class and is working with our dietician. Goal in progress on developing more efficient breathing techniques such as purse  lipped breathing and diaphragmatic breathing; and practicing self-pacing with activity. Goal in progress on improving her shortness of breath with ADLs. Goal in progress for increasing her knowledge of respiratory medications and ability to use respiratory devices properly. Gwendelyn has attended 1 class. Levona will benefit from participation in PR for nutrition, education, exercise and lifestyle modification. Goal in progress for weight loss. Lakhia is working with our dietician. Goal in progress on developing more efficient breathing techniques such as purse  lipped breathing and diaphragmatic breathing; and practicing self-pacing with activity. Goal in progress on improving her shortness of breath with ADLs. Her oxygen saturation has remained WNL on room air. Goal met for increasing her knowledge of respiratory medications and ability to use respiratory devices properly. She has correctly demonstrated and voiced when to use her medications with our respiratory therapist. Shenitra is currently working on reducing her stress through meditation and exercise. Shirlee will benefit from participation in PR for nutrition, education, exercise, and lifestyle modification. Goal in progress for weight loss. Lakietha is working with our dietician. Goal met on developing more efficient breathing techniques such as purse lipped breathing and diaphragmatic breathing; and practicing self-pacing with activity. Rihonna is able to use purse lip breathing on her own when she gets short of breath. She also self paces when she walks the track. Goal in progress on improving her shortness of breath with ADLs.  Chava is currently working on reducing her stress through meditation and exercise. Venona will benefit from participation in PR for nutrition, education, exercise, and lifestyle modification.     Expected Outcomes See admission goals See admission goals See admission goals              Core Components/Risk  Factors/Patient Goals at Discharge (Final Review):   Goals and Risk Factor Review - 08/05/23 1503       Core Components/Risk Factors/Patient Goals Review   Personal Goals Review Weight Management/Obesity;Improve shortness of breath with ADL's;Stress    Review Goal in progress for weight loss. Tianne is working with our dietician. Goal met on developing more efficient breathing techniques such as purse lipped breathing and diaphragmatic breathing; and practicing self-pacing with activity. Makensi is able to use purse lip breathing on her own when she gets short of breath. She also self paces when she walks the track. Goal in progress on improving her shortness of breath with ADLs.  Zaley is currently working on reducing her stress through meditation and exercise. Mariela will benefit from participation in PR for nutrition, education, exercise, and lifestyle modification.    Expected Outcomes See admission goals             ITP Comments:Pt is making expected progress toward Pulmonary Rehab goals after completing 17 sessions. Recommend continued exercise, life style modification, education, and utilization of breathing techniques to increase stamina and strength, while also decreasing shortness of breath with exertion.  Dr. Mechele Collin is Medical Director for Pulmonary Rehab at [facility].     Comments: Dr. Mechele Collin is Medical Director for Pulmonary Rehab at [facility].

## 2023-08-13 ENCOUNTER — Encounter (HOSPITAL_COMMUNITY)
Admission: RE | Admit: 2023-08-13 | Discharge: 2023-08-13 | Disposition: A | Discharge status: Home / Self Care | Payer: Medicare HMO | Source: Ambulatory Visit | Attending: Internal Medicine | Admitting: Internal Medicine

## 2023-08-13 DIAGNOSIS — J449 Chronic obstructive pulmonary disease, unspecified: Secondary | ICD-10-CM

## 2023-08-13 DIAGNOSIS — Z5189 Encounter for other specified aftercare: Secondary | ICD-10-CM | POA: Diagnosis not present

## 2023-08-13 DIAGNOSIS — J4489 Other specified chronic obstructive pulmonary disease: Secondary | ICD-10-CM | POA: Diagnosis not present

## 2023-08-13 DIAGNOSIS — Z87891 Personal history of nicotine dependence: Secondary | ICD-10-CM | POA: Diagnosis not present

## 2023-08-13 NOTE — Progress Notes (Signed)
Daily Session Note  Patient Details  Name: Barbara [name] MRN: [id number] Date of Birth: Jun 19, 1951 Referring Provider:   Doristine Devoid Pulmonary Rehab Walk Test from 06/03/2023 in [facility]  Referring Provider [name]       Encounter Date: 08/13/2023  Check In:  Session Check In - 08/13/23 1410       Check-In   Supervising physician immediately available to respond to emergencies [facility] MD immediately available    Physician(s) Metta Clines, NP    Location [facility]    Staff Present Elissa Lovett BS, ACSM-CEP, Exercise Physiologist;Kaylee Earlene Plater, MS, ACSM-CEP, Exercise Physiologist;Casey Synthia Innocent, RN, BSN    Virtual Visit No    Medication changes reported     No    Fall or balance concerns reported    No    Tobacco Cessation No Change    Warm-up and Cool-down Performed as group-led instruction    Resistance Training Performed Yes    VAD Patient? No    PAD/SET Patient? No      Pain Assessment   Currently in Pain? No/denies    Multiple Pain Sites No             Capillary Blood Glucose: No results found for this or any previous visit (from the past 24 hour(s)).    Social History   Tobacco Use  Smoking Status Former   Current packs/day: 0.00   Average packs/day: 1 pack/day for 45.0 years (45.0 ttl pk-yrs)   Types: Cigarettes   Start date: 10/26/1966   Quit date: 10/27/2011   Years since quitting: 11.8  Smokeless Tobacco Never    Goals Met:  Independence with exercise equipment Exercise tolerated well No report of concerns or symptoms today Strength training completed today  Goals Unmet:  Not Applicable  Comments: Service time is from 1312 to 1449    Dr. Mechele Collin is Medical Director for Pulmonary Rehab at [facility].

## 2023-08-18 ENCOUNTER — Encounter (HOSPITAL_COMMUNITY)
Admission: RE | Admit: 2023-08-18 | Discharge: 2023-08-18 | Disposition: A | Discharge status: Home / Self Care | Payer: Medicare HMO | Source: Ambulatory Visit | Attending: Internal Medicine | Admitting: Internal Medicine

## 2023-08-18 DIAGNOSIS — J4489 Other specified chronic obstructive pulmonary disease: Secondary | ICD-10-CM | POA: Diagnosis not present

## 2023-08-18 DIAGNOSIS — Z87891 Personal history of nicotine dependence: Secondary | ICD-10-CM | POA: Diagnosis not present

## 2023-08-18 DIAGNOSIS — J449 Chronic obstructive pulmonary disease, unspecified: Secondary | ICD-10-CM

## 2023-08-18 DIAGNOSIS — Z5189 Encounter for other specified aftercare: Secondary | ICD-10-CM | POA: Diagnosis not present

## 2023-08-18 NOTE — Progress Notes (Signed)
Daily Session Note  Patient Details  Name: Barbara [name] MRN: [id number] Date of Birth: [birth year] Referring Provider:   Doristine Devoid Pulmonary Rehab Walk Test from 06/03/2023 in [facility], [facility], [facility]  Referring Provider [name]       Encounter Date: 08/18/2023  Check In:  Session Check In - 08/18/23 1509       Check-In   Supervising physician immediately available to respond to emergencies [facility] immediately available    Physician(s) Metta Clines, NP    [facility]    Staff Present Samantha Belarus, RD, Dutch Gray, RN, BSN;Allona Gondek Idelle Crouch BS, ACSM-CEP, Exercise Physiologist;Kaylee Earlene Plater, MS, ACSM-CEP, Exercise Physiologist;Casey Katrinka Blazing, RT    Virtual Visit No    Medication changes reported     No    Fall or balance concerns reported    No    Tobacco Cessation No Change    Warm-up and Cool-down Performed as group-led instruction    Resistance Training Performed Yes    VAD Patient? No    PAD/SET Patient? No      Pain Assessment   Currently in Pain? No/denies    Multiple Pain Sites No             Capillary Blood Glucose: No results found for this or any previous visit (from the past 24 hour(s)).    Social History   Tobacco Use  Smoking Status Former   Current packs/day: 0.00   Average packs/day: 1 pack/day for 45.0 years (45.0 ttl pk-yrs)   Types: Cigarettes   Start date: 10/26/1966   Quit date: 10/27/2011   Years since quitting: 11.8  Smokeless Tobacco Never    Goals Met:  Independence with exercise equipment Improved SOB with ADL's Exercise tolerated well Personal goals reviewed No report of concerns or symptoms today Strength training completed today  Goals Unmet:  Not Applicable  Comments: Pt completed 6 min walk test today. Increased by 92.1%. Service time is from 1312 to 1445.    Dr. Mechele Collin is Medical Director for Pulmonary Rehab at [facility].

## 2023-08-20 ENCOUNTER — Encounter (HOSPITAL_COMMUNITY)
Admission: RE | Admit: 2023-08-20 | Discharge: 2023-08-20 | Disposition: A | Discharge status: Home / Self Care | Payer: Medicare HMO | Source: Ambulatory Visit | Attending: Internal Medicine | Admitting: Internal Medicine

## 2023-08-20 DIAGNOSIS — Z87891 Personal history of nicotine dependence: Secondary | ICD-10-CM | POA: Diagnosis not present

## 2023-08-20 DIAGNOSIS — J449 Chronic obstructive pulmonary disease, unspecified: Secondary | ICD-10-CM

## 2023-08-20 DIAGNOSIS — J4489 Other specified chronic obstructive pulmonary disease: Secondary | ICD-10-CM | POA: Diagnosis not present

## 2023-08-20 DIAGNOSIS — Z5189 Encounter for other specified aftercare: Secondary | ICD-10-CM | POA: Diagnosis not present

## 2023-08-25 ENCOUNTER — Encounter (HOSPITAL_COMMUNITY)
Admission: RE | Admit: 2023-08-25 | Discharge: 2023-08-25 | Disposition: A | Discharge status: Home / Self Care | Payer: Medicare HMO | Source: Ambulatory Visit | Attending: Internal Medicine

## 2023-08-25 VITALS — Wt 183.4 lb

## 2023-08-25 DIAGNOSIS — J449 Chronic obstructive pulmonary disease, unspecified: Secondary | ICD-10-CM

## 2023-08-25 DIAGNOSIS — Z87891 Personal history of nicotine dependence: Secondary | ICD-10-CM | POA: Diagnosis not present

## 2023-08-25 DIAGNOSIS — Z5189 Encounter for other specified aftercare: Secondary | ICD-10-CM | POA: Diagnosis not present

## 2023-08-25 DIAGNOSIS — J4489 Other specified chronic obstructive pulmonary disease: Secondary | ICD-10-CM | POA: Diagnosis not present

## 2023-08-25 NOTE — Progress Notes (Signed)
Daily Session Note  Patient Details  Name: Barbara [name] MRN: [id number] Date of Birth: 1951/07/28 Referring Provider:   Doristine Devoid Pulmonary Rehab Walk Test from 06/03/2023 in [facility], [facility], & Lung [facility]  Referring Provider [name]       Encounter Date: 08/25/2023  Check In:  Session Check In - 08/25/23 1408       Check-In   Supervising physician immediately available to respond to emergencies [facility] immediately available    Physician(s) Jari Favre, PA    [facility]    Staff Present Samantha Belarus, RD, Dutch Gray, RN, BSN;Randi Reeve BS, ACSM-CEP, Exercise Physiologist;Fahd Galea Earlene Plater, MS, ACSM-CEP, Exercise Physiologist;Casey Katrinka Blazing, RT    Virtual Visit No    Medication changes reported     No    Fall or balance concerns reported    No    Tobacco Cessation No Change    Warm-up and Cool-down Performed as group-led instruction    Resistance Training Performed Yes    VAD Patient? No      Pain Assessment   Multiple Pain Sites No             Capillary Blood Glucose: No results found for this or any previous visit (from the past 24 hour(s)).   Exercise Prescription Changes - 08/25/23 1400       Response to Exercise   Blood Pressure (Admit) 112/62    Blood Pressure (Exercise) 142/78    Blood Pressure (Exit) 104/66    Heart Rate (Admit) 67 bpm    Heart Rate (Exercise) 91 bpm    Heart Rate (Exit) 83 bpm    Oxygen Saturation (Admit) 96 %    Oxygen Saturation (Exercise) 93 %    Oxygen Saturation (Exit) 97 %    Rating of Perceived Exertion (Exercise) 11    Perceived Dyspnea (Exercise) 1    Duration Continue with 30 min of aerobic exercise without signs/symptoms of physical distress.    Intensity THRR unchanged      Progression   Progression Continue to progress workloads to maintain intensity without signs/symptoms of physical distress.      Resistance Training   Training Prescription Yes     Weight blue bands    Reps 10-15    Time 10 Minutes      NuStep   Level 6    Minutes 15    METs 3.6      Track   Laps 14    Minutes 15    METs 3.15             Social History   Tobacco Use  Smoking Status Former   Current packs/day: 0.00   Average packs/day: 1 pack/day for 45.0 years (45.0 ttl pk-yrs)   Types: Cigarettes   Start date: 10/26/1966   Quit date: 10/27/2011   Years since quitting: 11.8  Smokeless Tobacco Never    Goals Met:  Proper associated with RPD/PD & O2 Sat Independence with exercise equipment Exercise tolerated well No report of concerns or symptoms today Strength training completed today  Goals Unmet:  Not Applicable  Comments: Service time is from 1309 to 1440.    Dr. Mechele Collin is Medical Director for Pulmonary Rehab at [facility].

## 2023-08-26 ENCOUNTER — Telehealth: Payer: Self-pay | Admitting: Internal Medicine

## 2023-08-26 NOTE — Telephone Encounter (Signed)
Fax received from Dr. Marca Ancona with [name] to perform a colonoscopy with propofol on patient.  Patient needs surgery clearance. Surgery is 09/07/23. Patient was seen on 05/06/23 . Office protocol is a risk assessment can be sent to surgeon if patient has been seen in 60 days or less.   Sending to Dr [name] for risk assessment or recommendations if patient needs to be seen in office prior to surgical procedure.   Dr [name]- there are no openings in office with any provider prior to the date of her procedure. Are you able to add risk assessment to LOV? Thanks!

## 2023-08-27 ENCOUNTER — Encounter (HOSPITAL_COMMUNITY)
Admission: RE | Admit: 2023-08-27 | Discharge: 2023-08-27 | Disposition: A | Discharge status: Home / Self Care | Payer: Medicare HMO | Source: Ambulatory Visit | Attending: Internal Medicine | Admitting: Internal Medicine

## 2023-08-27 DIAGNOSIS — J4489 Other specified chronic obstructive pulmonary disease: Secondary | ICD-10-CM | POA: Diagnosis not present

## 2023-08-27 DIAGNOSIS — Z87891 Personal history of nicotine dependence: Secondary | ICD-10-CM | POA: Diagnosis not present

## 2023-08-27 DIAGNOSIS — J449 Chronic obstructive pulmonary disease, unspecified: Secondary | ICD-10-CM

## 2023-08-27 DIAGNOSIS — Z5189 Encounter for other specified aftercare: Secondary | ICD-10-CM | POA: Diagnosis not present

## 2023-08-27 DIAGNOSIS — L930 Discoid lupus erythematosus: Secondary | ICD-10-CM | POA: Diagnosis not present

## 2023-08-27 NOTE — Telephone Encounter (Signed)
I have faxed Dr Humphrey Rolls note from 08/03/23- risk assessment to [facility]

## 2023-08-27 NOTE — Progress Notes (Signed)
Daily Session Note  Patient Details  Name: Barbara [name] MRN: [id number] Date of Birth: [birth year] Referring Provider:   Doristine Devoid Pulmonary Rehab Walk Test from 06/03/2023 in [facility] for Heart, Vascular, & Lung Health  Referring Provider [name]       Encounter Date: 08/27/2023  Check In:  Session Check In - 08/27/23 1519       Check-In   Supervising physician immediately available to respond to emergencies [facility] immediately available    Physician(s) Carlos Levering, NP    [facility]    Staff Present Essie Hart, RN, BSN;Jamariyah Johannsen Idelle Crouch BS, ACSM-CEP, Exercise Physiologist;Kaylee Earlene Plater, MS, ACSM-CEP, Exercise Physiologist;Casey Katrinka Blazing, RT    Virtual Visit No    Medication changes reported     No    Fall or balance concerns reported    No    Tobacco Cessation No Change    Warm-up and Cool-down Performed as group-led instruction    Resistance Training Performed Yes    VAD Patient? No    PAD/SET Patient? No      Pain Assessment   Currently in Pain? No/denies             Capillary Blood Glucose: No results found for this or any previous visit (from the past 24 hour(s)).    Social History   Tobacco Use  Smoking Status Former   Current packs/day: 0.00   Average packs/day: 1 pack/day for 45.0 years (45.0 ttl pk-yrs)   Types: Cigarettes   Start date: 10/26/1966   Quit date: 10/27/2011   Years since quitting: 11.8  Smokeless Tobacco Never    Goals Met:  Independence with exercise equipment Exercise tolerated well No report of concerns or symptoms today Strength training completed today  Goals Unmet:  Not Applicable  Comments: Pt graduated today. She had a high success in the program. Service time is from 1300 to 1445.    Dr. Mechele Collin is Medical Director for Pulmonary Rehab at [facility].

## 2023-08-28 DIAGNOSIS — L930 Discoid lupus erythematosus: Secondary | ICD-10-CM | POA: Diagnosis not present

## 2023-08-28 NOTE — Progress Notes (Signed)
Discharge Progress Report  Patient Details  Name: Barbara [name] MRN: [id number] Date of Birth: 1951-10-11 Referring Provider:   Doristine Devoid Pulmonary Rehab Walk Test from 06/03/2023 in [facility]  Referring Provider Desai        Number of Visits: 22  Reason for Discharge:  Patient reached a stable level of exercise. Patient independent in their exercise. Patient has met program and personal goals.  Smoking History:  Social History   Tobacco Use  Smoking Status Former   Current packs/day: 0.00   Average packs/day: 1 pack/day for 45.0 years (45.0 ttl pk-yrs)   Types: Cigarettes   Start date: 10/26/1966   Quit date: 10/27/2011   Years since quitting: 11.8  Smokeless Tobacco Never    Diagnosis:  Stage 2 moderate COPD by GOLD classification (HCC)  ADL [facility]:  Pulmonary Assessment Scores     Row Name 06/03/23 1333 08/18/23 1521 08/20/23 1542     ADL [facility]SD   ADL Phase Entry Exit Exit   SOB Score total 56 -- 48     CAT Score   CAT Score 25 -- 16     mMRC Score   mMRC Score 3 3 --            Initial Exercise Prescription:  Initial Exercise Prescription - 06/03/23 1400       Date of Initial Exercise RX and Referring Provider   Date 06/03/23    Referring Provider [name]    Expected Discharge Date 08/27/23      NuStep   Level 2    SPM 60    Minutes 15    METs 1.5      Track   Minutes 15    METs 1.5      Prescription Details   Frequency (times per week) 2    Duration Progress to 30 minutes of continuous aerobic without signs/symptoms of physical distress      Intensity   THRR 40-80% of Max Heartrate 58-117    Ratings of Perceived Exertion 11-13    Perceived Dyspnea 0-4      Progression   Progression Continue progressive overload as per policy without signs/symptoms or physical distress.      Resistance Training   Training Prescription Yes    Weight blue bands    Reps 10-15              Discharge Exercise Prescription (Final Exercise Prescription Changes):  Exercise Prescription Changes - 08/25/23 1400       Response to Exercise   Blood Pressure (Admit) 112/62    Blood Pressure (Exercise) 142/78    Blood Pressure (Exit) 104/66    Heart Rate (Admit) 67 bpm    Heart Rate (Exercise) 91 bpm    Heart Rate (Exit) 83 bpm    Oxygen Saturation (Admit) 96 %    Oxygen Saturation (Exercise) 93 %    Oxygen Saturation (Exit) 97 %    Rating of Perceived Exertion (Exercise) 11    Perceived Dyspnea (Exercise) 1    Duration Continue with 30 min of aerobic exercise without signs/symptoms of physical distress.    Intensity THRR unchanged      Progression   Progression Continue to progress workloads to maintain intensity without signs/symptoms of physical distress.      Resistance Training   Training Prescription Yes    Weight blue bands    Reps 10-15    Time 10 Minutes  NuStep   Level 6    Minutes 15    METs 3.6      Track   Laps 14    Minutes 15    METs 3.15             Functional Capacity:  6 Minute Walk     Row Name 06/03/23 1421 08/18/23 1519       6 Minute Walk   Phase Initial Discharge    Distance 812 feet 1560 feet    Distance % Change -- 92.1 %    Distance Feet Change -- 748 ft    Walk Time 6 minutes 6 minutes    # of Rest Breaks 0 0    MPH 1.54 2.95    METS 1.56 3.24    RPE 11 11    Perceived Dyspnea  1 2    VO2 Peak 5.47 11.33    Symptoms Yes (comment) No    Comments fatigue/SOB --    Resting HR 57 bpm 60 bpm    Resting BP 114/60 102/60    Resting Oxygen Saturation  96 % 96 %    Exercise Oxygen Saturation  during 6 min walk 89 % 93 %    Max Ex. HR 88 bpm 115 bpm    Max Ex. BP 136/80 146/70    2 Minute Post BP 106/60 130/50      Interval HR   1 Minute HR 73 69    2 Minute HR 88 102    3 Minute HR 88 115    4 Minute HR 88 113    5 Minute HR 85 114    6 Minute HR 87 103    2 Minute Post HR 71 78    Interval Heart  Rate? Yes --      Interval Oxygen   Interval Oxygen? Yes --    Baseline Oxygen Saturation % 96 % 96 %    1 Minute Oxygen Saturation % 93 % 96 %    1 Minute Liters of Oxygen 0 L 0 L    2 Minute Oxygen Saturation % 91 % 99 %    2 Minute Liters of Oxygen 0 L 0 L    3 Minute Oxygen Saturation % 91 % 93 %    3 Minute Liters of Oxygen 0 L 0 L    4 Minute Oxygen Saturation % 90 % 95 %    4 Minute Liters of Oxygen 0 L 0 L    5 Minute Oxygen Saturation % 93 % 96 %    5 Minute Liters of Oxygen 0 L 0 L    6 Minute Oxygen Saturation % 89 % 94 %    6 Minute Liters of Oxygen 0 L 0 L    2 Minute Post Oxygen Saturation % 97 % 100 %    2 Minute Post Liters of Oxygen 0 L 0 L             Psychological, QOL, Others - Outcomes: PHQ 2/9:    08/20/2023    3:42 PM 06/03/2023    1:18 PM  Depression screen PHQ 2/9  Decreased Interest 0 0  Down, Depressed, Hopeless 0 0  PHQ - 2 Score 0 0  Altered sleeping 0 0  Tired, decreased energy 0 0  Change in appetite 0 0  Feeling bad or failure about yourself  0 0  Trouble concentrating 0 0  Moving slowly or fidgety/restless 0 0  Suicidal  thoughts 0 0  PHQ-9 Score 0 0  Difficult doing work/chores Not difficult at all Not difficult at all    Quality of Life:   Personal Goals: Goals established at orientation with interventions provided to work toward goal.  Personal Goals and Risk Factors at Admission - 06/03/23 1325       Core Components/Risk Factors/Patient Goals on Admission    Weight Management Weight Loss    Improve shortness of breath with ADL's Yes    Intervention Provide education, individualized exercise plan and daily activity instruction to help decrease symptoms of SOB with activities of daily living.    Expected Outcomes Short Term: Improve cardiorespiratory fitness to achieve a reduction of symptoms when performing ADLs;Long Term: Be able to perform more ADLs without symptoms or delay the onset of symptoms    Increase knowledge of  respiratory medications and ability to use respiratory devices properly  Yes    Intervention Provide education and demonstration as needed of appropriate use of medications, inhalers, and oxygen therapy.    Expected Outcomes Short Term: Achieves understanding of medications use. Understands that oxygen is a medication prescribed by physician. Demonstrates appropriate use of inhaler and oxygen therapy.;Long Term: Maintain appropriate use of medications, inhalers, and oxygen therapy.    Stress Yes    Intervention Offer individual and/or small group education and counseling on adjustment to heart disease, stress management and health-related lifestyle change. Teach and support self-help strategies.;Refer participants experiencing significant psychosocial distress to appropriate mental health specialists for further evaluation and treatment. When possible, include family members and significant others in education/counseling sessions.    Expected Outcomes Short Term: Participant demonstrates changes in health-related behavior, relaxation and other stress management skills, ability to obtain effective social support, and compliance with psychotropic medications if prescribed.;Long Term: Emotional wellbeing is indicated by absence of clinically significant psychosocial distress or social isolation.              Personal Goals Discharge:  Goals and Risk Factor Review     Row Name 06/12/23 1058 07/10/23 1108 08/05/23 1503         Core Components/Risk Factors/Patient Goals Review   Personal Goals Review Weight Management/Obesity;Improve shortness of breath with ADL's;Develop more efficient breathing techniques such as purse lipped breathing and diaphragmatic breathing and practicing self-pacing with activity.;Increase knowledge of respiratory medications and ability to use respiratory devices properly.;Stress Weight Management/Obesity;Improve shortness of breath with ADL's;Develop more efficient  breathing techniques such as purse lipped breathing and diaphragmatic breathing and practicing self-pacing with activity.;Increase knowledge of respiratory medications and ability to use respiratory devices properly.;Stress Weight Management/Obesity;Improve shortness of breath with ADL's;Stress     Review Goal in progress for weight loss. Lakiera has completed 1 class and is working with our dietician. Goal in progress on developing more efficient breathing techniques such as purse lipped breathing and diaphragmatic breathing; and practicing self-pacing with activity. Goal in progress on improving her shortness of breath with ADLs. Goal in progress for increasing her knowledge of respiratory medications and ability to use respiratory devices properly. Silke has attended 1 class. Gwenlyn will benefit from participation in PR for nutrition, education, exercise and lifestyle modification. Goal in progress for weight loss. Kaylann is working with our dietician. Goal in progress on developing more efficient breathing techniques such as purse lipped breathing and diaphragmatic breathing; and practicing self-pacing with activity. Goal in progress on improving her shortness of breath with ADLs. Her oxygen saturation has remained WNL on room air. Goal met for increasing  her knowledge of respiratory medications and ability to use respiratory devices properly. She has correctly demonstrated and voiced when to use her medications with our respiratory therapist. Ensley is currently working on reducing her stress through meditation and exercise. Maybeth will benefit from participation in PR for nutrition, education, exercise, and lifestyle modification. Goal in progress for weight loss. Febe is working with our dietician. Goal met on developing more efficient breathing techniques such as purse lipped breathing and diaphragmatic breathing; and practicing self-pacing with activity. Lynnanne is able to use purse lip breathing  on her own when she gets short of breath. She also self paces when she walks the track. Goal in progress on improving her shortness of breath with ADLs.  Dezmariah is currently working on reducing her stress through meditation and exercise. Kiylee will benefit from participation in PR for nutrition, education, exercise, and lifestyle modification.     Expected Outcomes See admission goals See admission goals See admission goals              Exercise Goals and Review:  Exercise Goals     Row Name 06/03/23 1327 06/10/23 1525 07/13/23 1215 08/06/23 1556       Exercise Goals   Increase Physical Activity Yes Yes Yes Yes    Intervention Provide advice, education, support and counseling about physical activity/exercise needs.;Develop an individualized exercise prescription for aerobic and resistive training based on initial evaluation findings, risk stratification, comorbidities and participant's personal goals. Provide advice, education, support and counseling about physical activity/exercise needs.;Develop an individualized exercise prescription for aerobic and resistive training based on initial evaluation findings, risk stratification, comorbidities and participant's personal goals. Provide advice, education, support and counseling about physical activity/exercise needs.;Develop an individualized exercise prescription for aerobic and resistive training based on initial evaluation findings, risk stratification, comorbidities and participant's personal goals. Provide advice, education, support and counseling about physical activity/exercise needs.;Develop an individualized exercise prescription for aerobic and resistive training based on initial evaluation findings, risk stratification, comorbidities and participant's personal goals.    Expected Outcomes Short Term: Attend rehab on a regular basis to increase amount of physical activity.;Long Term: Exercising regularly at least 3-5 days a week.;Long  Term: Add in home exercise to make exercise part of routine and to increase amount of physical activity. Short Term: Attend rehab on a regular basis to increase amount of physical activity.;Long Term: Exercising regularly at least 3-5 days a week.;Long Term: Add in home exercise to make exercise part of routine and to increase amount of physical activity. Short Term: Attend rehab on a regular basis to increase amount of physical activity.;Long Term: Exercising regularly at least 3-5 days a week.;Long Term: Add in home exercise to make exercise part of routine and to increase amount of physical activity. Short Term: Attend rehab on a regular basis to increase amount of physical activity.;Long Term: Exercising regularly at least 3-5 days a week.;Long Term: Add in home exercise to make exercise part of routine and to increase amount of physical activity.    Increase Strength and Stamina Yes Yes Yes Yes    Intervention Provide advice, education, support and counseling about physical activity/exercise needs.;Develop an individualized exercise prescription for aerobic and resistive training based on initial evaluation findings, risk stratification, comorbidities and participant's personal goals. Provide advice, education, support and counseling about physical activity/exercise needs.;Develop an individualized exercise prescription for aerobic and resistive training based on initial evaluation findings, risk stratification, comorbidities and participant's personal goals. Provide advice, education, support and counseling about physical  activity/exercise needs.;Develop an individualized exercise prescription for aerobic and resistive training based on initial evaluation findings, risk stratification, comorbidities and participant's personal goals. Provide advice, education, support and counseling about physical activity/exercise needs.;Develop an individualized exercise prescription for aerobic and resistive training based  on initial evaluation findings, risk stratification, comorbidities and participant's personal goals.    Expected Outcomes Short Term: Increase workloads from initial exercise prescription for resistance, speed, and METs.;Short Term: Perform resistance training exercises routinely during rehab and add in resistance training at home;Long Term: Improve cardiorespiratory fitness, muscular endurance and strength as measured by increased METs and functional capacity ( ) Short Term: Increase workloads from initial exercise prescription for resistance, speed, and METs.;Short Term: Perform resistance training exercises routinely during rehab and add in resistance training at home;Long Term: Improve cardiorespiratory fitness, muscular endurance and strength as measured by increased METs and functional capacity ( ) Short Term: Increase workloads from initial exercise prescription for resistance, speed, and METs.;Short Term: Perform resistance training exercises routinely during rehab and add in resistance training at home;Long Term: Improve cardiorespiratory fitness, muscular endurance and strength as measured by increased METs and functional capacity ( ) Short Term: Increase workloads from initial exercise prescription for resistance, speed, and METs.;Short Term: Perform resistance training exercises routinely during rehab and add in resistance training at home;Long Term: Improve cardiorespiratory fitness, muscular endurance and strength as measured by increased METs and functional capacity ( )    Able to understand and use rate of perceived exertion (RPE) scale Yes Yes Yes Yes    Intervention Provide education and explanation on how to use RPE scale Provide education and explanation on how to use RPE scale Provide education and explanation on how to use RPE scale Provide education and explanation on how to use RPE scale    Expected Outcomes Short Term: Able to use RPE daily in rehab to express subjective  intensity level;Long Term:  Able to use RPE to guide intensity level when exercising independently Short Term: Able to use RPE daily in rehab to express subjective intensity level;Long Term:  Able to use RPE to guide intensity level when exercising independently Short Term: Able to use RPE daily in rehab to express subjective intensity level;Long Term:  Able to use RPE to guide intensity level when exercising independently Short Term: Able to use RPE daily in rehab to express subjective intensity level;Long Term:  Able to use RPE to guide intensity level when exercising independently    Able to understand and use Dyspnea scale Yes Yes Yes Yes    Intervention Provide education and explanation on how to use Dyspnea scale Provide education and explanation on how to use Dyspnea scale Provide education and explanation on how to use Dyspnea scale Provide education and explanation on how to use Dyspnea scale    Expected Outcomes Short Term: Able to use Dyspnea scale daily in rehab to express subjective sense of shortness of breath during exertion;Long Term: Able to use Dyspnea scale to guide intensity level when exercising independently Short Term: Able to use Dyspnea scale daily in rehab to express subjective sense of shortness of breath during exertion;Long Term: Able to use Dyspnea scale to guide intensity level when exercising independently Short Term: Able to use Dyspnea scale daily in rehab to express subjective sense of shortness of breath during exertion;Long Term: Able to use Dyspnea scale to guide intensity level when exercising independently Short Term: Able to use Dyspnea scale daily in rehab to express subjective sense of shortness of breath during exertion;Long Term: Able  to use Dyspnea scale to guide intensity level when exercising independently    Knowledge and understanding of Target Heart Rate Range (THRR) Yes Yes Yes Yes    Intervention Provide education and explanation of THRR including how the  numbers were predicted and where they are located for reference Provide education and explanation of THRR including how the numbers were predicted and where they are located for reference Provide education and explanation of THRR including how the numbers were predicted and where they are located for reference Provide education and explanation of THRR including how the numbers were predicted and where they are located for reference    Expected Outcomes Short Term: Able to state/look up THRR;Long Term: Able to use THRR to govern intensity when exercising independently;Short Term: Able to use daily as guideline for intensity in rehab Short Term: Able to state/look up THRR;Long Term: Able to use THRR to govern intensity when exercising independently;Short Term: Able to use daily as guideline for intensity in rehab Short Term: Able to state/look up THRR;Long Term: Able to use THRR to govern intensity when exercising independently;Short Term: Able to use daily as guideline for intensity in rehab Short Term: Able to state/look up THRR;Long Term: Able to use THRR to govern intensity when exercising independently;Short Term: Able to use daily as guideline for intensity in rehab    Understanding of Exercise Prescription Yes Yes Yes Yes    Intervention Provide education, explanation, and written materials on patient's individual exercise prescription Provide education, explanation, and written materials on patient's individual exercise prescription Provide education, explanation, and written materials on patient's individual exercise prescription Provide education, explanation, and written materials on patient's individual exercise prescription    Expected Outcomes Short Term: Able to explain program exercise prescription;Long Term: Able to explain home exercise prescription to exercise independently Short Term: Able to explain program exercise prescription;Long Term: Able to explain home exercise prescription to exercise  independently Short Term: Able to explain program exercise prescription;Long Term: Able to explain home exercise prescription to exercise independently Short Term: Able to explain program exercise prescription;Long Term: Able to explain home exercise prescription to exercise independently             Exercise Goals Re-Evaluation:  Exercise Goals Re-Evaluation     Row Name 06/10/23 1525 07/13/23 1215 08/06/23 1556 08/27/23 1520       Exercise Goal Re-Evaluation   Exercise Goals Review Increase Physical Activity;Able to understand and use Dyspnea scale;Understanding of Exercise Prescription;Increase Strength and Stamina;Knowledge and understanding of Target Heart Rate Range (THRR);Able to understand and use rate of perceived exertion (RPE) scale Increase Physical Activity;Able to understand and use Dyspnea scale;Understanding of Exercise Prescription;Increase Strength and Stamina;Knowledge and understanding of Target Heart Rate Range (THRR);Able to understand and use rate of perceived exertion (RPE) scale Increase Physical Activity;Able to understand and use Dyspnea scale;Understanding of Exercise Prescription;Increase Strength and Stamina;Knowledge and understanding of Target Heart Rate Range (THRR);Able to understand and use rate of perceived exertion (RPE) scale Increase Physical Activity;Able to understand and use Dyspnea scale;Understanding of Exercise Prescription;Increase Strength and Stamina;Knowledge and understanding of Target Heart Rate Range (THRR);Able to understand and use rate of perceived exertion (RPE) scale    Comments Pt has completed one day of group exercise. She exercised on the recumbent stepper for 15 min, level 2, METs 1.5. Pt then walked on the track for 11 laps, METs 2.69, for 15 min. Tolerated well. She performed leg extentions instead of squats due to knee pain. Will progress as  tolerated. Pt has completed 9 days of group exercise. She exercised on the recumbent stepper for  15 min, level 5, METs 3.2. Pt then is walking on the track for 1 laps, METs 2.85, for 15 min. Tolerated well. She is working on squats. Progressing well. Pt has completed 16 days of group exercise. She is exercising on the recumbent stepper for 15 min, level 6, METs 3.2. Pt then is walking on the track for 15 laps, METs 3.31, for 15 min. She tried the treadmill but became dizzy. She is pushing herself on the track.  Progressing well. Pt completed 22 days of group exercise. She exercised on the recumbent stepper for 15 min, level 6, averaging METs 3.6. Pt then walked on the track for 15 laps, METs 3.31, for 15 min. She greatly increased her 6 min walk test, 92 %. She graduated using blue bands. She has progressed well in the program and plans to continue walking at home.    Expected Outcomes Through exercise at rehab and at home, the patient will decrease shortness of breath with daily activities and feel confident in carrying out an exercise regime at home. Through exercise at rehab and at home, the patient will decrease shortness of breath with daily activities and feel confident in carrying out an exercise regime at home. Through exercise at rehab and at home, the patient will decrease shortness of breath with daily activities and feel confident in carrying out an exercise regime at home. Through exercise at rehab and at home, the patient will decrease shortness of breath with daily activities and feel confident in carrying out an exercise regime at home.             Nutrition & Weight - Outcomes:  Pre Biometrics - 06/03/23 1425       Pre Biometrics   Grip Strength 34 kg             Post Biometrics - 08/18/23 1521        Post  Biometrics   Grip Strength 36 kg             Nutrition:  Nutrition Therapy & Goals - 08/11/23 1408       Nutrition Therapy   Diet Heart healthy diet    Drug/Food Interactions Statins/Certain Fruits      Personal Nutrition Goals   Nutrition Goal  Patient to improve diet quality by using the plate method as a guide for meal planning to include lean protein/plant protein, fruits, vegetables, whole grains, nonfat dairy as part of a well-balanced diet.   goal in progress.   Personal Goal #2 Patient to limit sodium to 2300mg  per day   goal in progress.   Comments Goals in progress. Micheal continues to monitor sodium intake. Reviewed strategies for mindful eating to aid with weight maintenance/weight loss. Patient is down 1.5# since starting with our program. Patient will benefit from participation in pulmonary rehab for nutrition, exercise, and lifestyle modification.      Intervention Plan   Intervention Prescribe, educate and counsel regarding individualized specific dietary modifications aiming towards targeted core components such as weight, hypertension, lipid management, diabetes, heart failure and other comorbidities.    Expected Outcomes Short Term Goal: Understand basic principles of dietary content, such as calories, fat, sodium, cholesterol and nutrients.;Long Term Goal: Adherence to prescribed nutrition plan.             Nutrition Discharge:  Nutrition Assessments - 08/20/23 1450  Rate Your Plate Scores   Pre Score 66    Post Score 59             Education Questionnaire Score:  Knowledge Questionnaire Score - 08/20/23 1542       Knowledge Questionnaire Score   Post Score 18/18             Lashante graduated the Pulmonary Rehab program today, 08/27/23. She completed 22 classes with perfect attendance. At the time of graduation, Diamonds stated her mental health was stable. 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 At times, she has caregiver fatigue but declines to seek help or speak to a counselor. Alejandrina's PHQ 2-9, scores maintained 0/0. She denied any psychosocial needs at the time of discharge.   Glendola's core components goals are as follows: Angeli has met her goal for weight loss. She worked  with our dietician on ways to decrease calories and increase more lean meats, fruits, and vegetables. At the time of graduation her weight was down ~2#. Meyer Russel met her goal for improving her shortness of breath with ADL's. Kyiesha was able to keep her oxygen saturations >88% on RA while increasing her METs and workloads. Her Shortness of Breath score also decreased from a 56-48, her CAT score decreased from a 25-16, but her MMRC remained the same at a 3. Zurisaday made great progress in the class as evidenced by a 92.1% increase in her , increased METS, MPH, VO2Peak, and grip strength. We are proud of the success Juliauna has made in the program!   Goals reviewed with patient; copy given to patient.

## 2023-09-07 DIAGNOSIS — D123 Benign neoplasm of transverse colon: Secondary | ICD-10-CM | POA: Diagnosis not present

## 2023-09-07 DIAGNOSIS — K5730 Diverticulosis of large intestine without perforation or abscess without bleeding: Secondary | ICD-10-CM | POA: Diagnosis not present

## 2023-09-07 DIAGNOSIS — Z86010 Personal history of colonic polyps: Secondary | ICD-10-CM | POA: Diagnosis not present

## 2023-09-07 DIAGNOSIS — D120 Benign neoplasm of cecum: Secondary | ICD-10-CM | POA: Diagnosis not present

## 2023-09-07 DIAGNOSIS — K648 Other hemorrhoids: Secondary | ICD-10-CM | POA: Diagnosis not present

## 2023-09-07 DIAGNOSIS — Z09 Encounter for follow-up examination after completed treatment for conditions other than malignant neoplasm: Secondary | ICD-10-CM | POA: Diagnosis not present

## 2023-09-09 ENCOUNTER — Inpatient Hospital Stay
Admission: RE | Admit: 2023-09-09 | Discharge: 2023-09-09 | Discharge status: Home / Self Care | Payer: Medicare HMO | Source: Ambulatory Visit | Attending: Family Medicine | Admitting: Family Medicine

## 2023-09-09 DIAGNOSIS — R911 Solitary pulmonary nodule: Secondary | ICD-10-CM | POA: Diagnosis not present

## 2023-09-09 DIAGNOSIS — Z87891 Personal history of nicotine dependence: Secondary | ICD-10-CM | POA: Diagnosis not present

## 2023-09-09 DIAGNOSIS — D120 Benign neoplasm of cecum: Secondary | ICD-10-CM | POA: Diagnosis not present

## 2023-09-09 DIAGNOSIS — I288 Other diseases of pulmonary vessels: Secondary | ICD-10-CM | POA: Diagnosis not present

## 2023-09-09 DIAGNOSIS — J432 Centrilobular emphysema: Secondary | ICD-10-CM | POA: Diagnosis not present

## 2023-09-09 DIAGNOSIS — D123 Benign neoplasm of transverse colon: Secondary | ICD-10-CM | POA: Diagnosis not present

## 2023-09-09 DIAGNOSIS — I700 Atherosclerosis of aorta: Secondary | ICD-10-CM | POA: Diagnosis not present

## 2023-09-28 ENCOUNTER — Other Ambulatory Visit: Payer: Self-pay

## 2023-09-28 DIAGNOSIS — Z87891 Personal history of nicotine dependence: Secondary | ICD-10-CM

## 2023-09-28 DIAGNOSIS — Z122 Encounter for screening for malignant neoplasm of respiratory organs: Secondary | ICD-10-CM

## 2023-10-09 ENCOUNTER — Ambulatory Visit: Payer: Medicare HMO | Admitting: Internal Medicine

## 2023-10-28 ENCOUNTER — Encounter: Payer: Self-pay | Admitting: Internal Medicine

## 2023-10-28 ENCOUNTER — Ambulatory Visit: Payer: Medicare HMO | Admitting: Internal Medicine

## 2023-10-28 VITALS — BP 136/72 | HR 61 | Temp 99.1°F | Ht 64.5 in | Wt 182.6 lb

## 2023-10-28 DIAGNOSIS — J301 Allergic rhinitis due to pollen: Secondary | ICD-10-CM | POA: Diagnosis not present

## 2023-10-28 DIAGNOSIS — J4550 Severe persistent asthma, uncomplicated: Secondary | ICD-10-CM

## 2023-10-28 DIAGNOSIS — K219 Gastro-esophageal reflux disease without esophagitis: Secondary | ICD-10-CM | POA: Diagnosis not present

## 2023-10-28 MED ORDER — AZELASTINE HCL 0.1 % NA SOLN: 1.0000 | Freq: Two times a day (BID) | NASAL | 0 refills | Status: AC

## 2023-10-28 MED ORDER — AZELASTINE HCL 0.1 % NA SOLN: 1.0000 | Freq: Two times a day (BID) | NASAL | 12 refills | Status: DC

## 2023-10-28 MED ORDER — LEVOCETIRIZINE DIHYDROCHLORIDE 5 MG PO TABS: 5.0000 mg | ORAL_TABLET | Freq: Every evening | ORAL | 11 refills | Status: DC

## 2023-10-28 NOTE — Patient Instructions (Addendum)
It was a pleasure to see you today!  Please schedule follow up scheduled with myself in 6 months.  If my schedule is not open yet, we will contact you with a reminder closer to that time. Please call [phone] if you haven't heard from [place] a month before, and always call us sooner if issues or concerns arise. You can also send [place] a message through MyChart, but but aware that this is not to be used for urgent issues and it may take up to 5-7 days to receive a reply. Please be aware that you will likely be able to view your results before I have a chance to respond to them. Please give [place] 5 business days to respond to any non-urgent results.   Glad you are doing well! Continue acid reflux medication, advair, spiriva and albuterol as needed. Continue the dupixent injections Continue the acid reflux medication For the allergies: Start astelin nasal spray, xyzal, and get back on the singulair.  astelin - 1 spray on each side of your nose twice a day for first week, then 1 spray on each side.   Instructions for use: If you also use a saline nasal spray or rinse, use that first. Position the head with the chin slightly tucked. Use the right hand to spray into the left nostril and the right hand to spray into the left nostril.   Point the bottle away from the septum of your nose (cartilage that divides the two sides of your nose).  Hold the nostril closed on the opposite side from where you will spray Spray once and gently sniff to pull the medicine into the higher parts of your nose.  Don't sniff too hard as the medicine will drain down the back of your throat instead. Repeat with a second spray on the same side if prescribed. Repeat on the other side of your nose.

## 2023-10-28 NOTE — Progress Notes (Signed)
MOVITA [facility]RT    [id number]    1951/03/10  Primary Care Physician:Sun, Charise Carwin, MD Date of Appointment: 10/28/2023 Established Patient Visit  Chief complaint:   Chief Complaint  Patient presents with   Follow-up    Some wheezing this morning.  Now sinus pressure, PND, headache and sore throat since last week.  Negative home COVID tests over last 5 days.     HPI: Barbara [name] is a 72 y.o. woman with Asthma COPD overlap syndrome on dupixent since February 2024.   Interval Updates: Here for follow up. Has some allergies, post nasal drainage, headache.Has run out of zyrtec and singulair. Been taking OTC mucinex to help with symptoms and cough drops.   She did pulmonary rehab and loved it. She's not able to walk around more during the day and at her church. 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   Hasn't needed any prednisone since starting dupixent.  No flares of asthma.   Current Regimen: advair 500 1 puff BID, spiriva, prn albuterol Asthma Triggers: allergies, being outside, exertion, cleaning solutions, heat Exacerbations in the last year: none requiring prednisone since starting dupixent History of hospitalization or intubation:none Allergy Testing: never had.  GERD: yes on PPI once daily Allergic Rhinitis: on cetirizine, singulair ACT:  Asthma Control Test ACT Total Score  12/02/2022  1:36 PM 21  04/08/2022  1:31 PM 20  10/08/2021  1:52 PM 21   FeNO: 137 ppb ---->24 ppb 09/18/22  I have reviewed the patient's family social and past medical history and updated as appropriate.   Past Medical History:  Diagnosis Date   Arthritis    right knee, neck   Asthma    Chronic kidney disease    COPD (chronic obstructive pulmonary disease) (HCC)    Hypertension    Mouth trouble    infection from tooth that was pulled approx. mid Mar. 2013    Past Surgical History:  Procedure Laterality Date   ABDOMINAL  HYSTERECTOMY     SHOULDER SURGERY  in age 53's   cyst removed from [place] socket of left shoulder   TONSILLECTOMY  age 2   TUBAL LIGATION      Family History  Problem Relation Age of Onset   Dementia Mother    Kidney disease Mother    Asthma Sister    Mental illness Brother    Breast cancer Neg Hx     Social History   Occupational History   Not on file  Tobacco Use   Smoking status: Former    Current packs/day: 0.00    Average packs/day: 1 pack/day for 45.0 years (45.0 ttl pk-yrs)    Types: Cigarettes    Start date: 10/26/1966    Quit date: 10/27/2011    Years since quitting: 12.0   Smokeless tobacco: Never  Vaping Use   Vaping status: Never Used  Substance and Sexual Activity   Alcohol use: No   Drug use: No   Sexual activity: Never     Physical Exam: Blood pressure 136/72, pulse 61, temperature 99.1 F (37.3 C), temperature source Oral, height 5' 4.5" (1.638 m), weight 182 lb 9.6 oz (82.8 kg), SpO2 98%.  Gen:      No distress, obese ENT: watery eyes.  Lungs:   ctab no wheeze CV:         RRR no mrg   Data Reviewed: Imaging: I have personally reviewed the  chest xray April 2018 - mild cardiomegaly  PFTs:      Latest Ref Rng & Units 04/17/2021    2:55 PM  PFT Results  FVC-Pre L 1.58   FVC-Predicted Pre % 68   FVC-Post L 1.72   FVC-Predicted Post % 74   Pre FEV1/FVC % % 63   Post FEV1/FCV % % 66   FEV1-Pre L 0.99   FEV1-Predicted Pre % 55   FEV1-Post L 1.13    I have personally reviewed the patient's PFTs and moderately severe airflow limitation  Labs: IgE elevated 1,061 Sensitive to dust mites, cats, dogs, grass, cockroach, mouse  Immunization status: Immunization History  Administered Date(s) Administered   Hepatitis A, Adult 03/12/2016, 09/08/2016   Influenza Split 10/22/2011, 09/24/2012, 09/22/2013, 10/06/2013, 09/07/2014, 09/08/2015, 09/04/2017, 09/03/2018   Influenza, High Dose Seasonal PF 08/08/2021   Influenza-Unspecified 09/19/2022    PFIZER(Purple Top)SARS-COV-2 Vaccination 01/02/2020, 01/23/2020, 03/20/2021, 08/16/2021   Pneumococcal Conjugate-13 07/02/2015   Pneumococcal Polysaccharide-23 10/22/2011, 01/04/2019   Tdap 01/02/2016   Zoster, Live 01/02/2016, 10/21/2017, 12/22/2017    Assessment:  Allergic Asthma COPD Overlap Syndrome, with improvement of symptoms on dupixent.  Peripheral eosinophilia  History of tobacco use disorder, in remission.  GERD controlled Allergic Rhinitis not well controlled Elevated FeNO  Plan/Recommendations: Glad you are doing well! Continue acid reflux medication, advair, spiriva and albuterol as needed. Continue the dupixent injections Continue the acid reflux medication For the allergies: Start astelin nasal spray, xyzal, and get back on the singulair.   Return to Care: Return in about 6 months (around 04/26/2024).   Durel Salts, MD Pulmonary and Critical Care Medicine [facility] [phone]

## 2023-11-16 ENCOUNTER — Telehealth: Payer: Self-pay

## 2023-11-16 NOTE — Telephone Encounter (Signed)
Pt contacted [place] looking to renew her enrollment into [employer]ent [employer] in 2025. Will complete paperwork and she will come sign application on Wednesday 12/11. Will also ensure that current PA is in place if needed for re-enrollment.  Submitted a Prior Authorization request to [facility] for DUPIXENT via CoverMyMeds. Will update once we receive a response.  Key: [id number]

## 2023-11-17 NOTE — Telephone Encounter (Signed)
Received notification from [facility] regarding a prior authorization for DUPIXENT. Authorization has been APPROVED from 11/16/23 to 12/07/24. Approval letter sent to scan center.  Chesley Mires, PharmD, MPH, BCPS, CPP Clinical Pharmacist (Rheumatology and Pulmonology)

## 2023-11-18 NOTE — Telephone Encounter (Signed)
Patient signed paperwork and Medicare Prescription savings plan was discussed in more detail. Informational handout provided to pt. Application placed in "awaiting response" folder pending return of provider portion.

## 2023-11-20 ENCOUNTER — Other Ambulatory Visit: Payer: Self-pay | Admitting: Internal Medicine

## 2023-11-25 DIAGNOSIS — H40013 Open angle with borderline findings, low risk, bilateral: Secondary | ICD-10-CM | POA: Diagnosis not present

## 2023-11-25 DIAGNOSIS — H524 Presbyopia: Secondary | ICD-10-CM | POA: Diagnosis not present

## 2023-11-25 DIAGNOSIS — H33311 Horseshoe tear of retina without detachment, right eye: Secondary | ICD-10-CM | POA: Diagnosis not present

## 2023-11-25 DIAGNOSIS — H35033 Hypertensive retinopathy, bilateral: Secondary | ICD-10-CM | POA: Diagnosis not present

## 2023-11-25 DIAGNOSIS — H35013 Changes in retinal vascular appearance, bilateral: Secondary | ICD-10-CM | POA: Diagnosis not present

## 2023-11-26 ENCOUNTER — Ambulatory Visit (INDEPENDENT_AMBULATORY_CARE_PROVIDER_SITE_OTHER): Payer: No Typology Code available for payment source | Admitting: Ophthalmology

## 2023-11-26 ENCOUNTER — Encounter (INDEPENDENT_AMBULATORY_CARE_PROVIDER_SITE_OTHER): Payer: Self-pay | Admitting: Ophthalmology

## 2023-11-26 DIAGNOSIS — I10 Essential (primary) hypertension: Secondary | ICD-10-CM

## 2023-11-26 DIAGNOSIS — H04123 Dry eye syndrome of bilateral lacrimal glands: Secondary | ICD-10-CM

## 2023-11-26 DIAGNOSIS — H35033 Hypertensive retinopathy, bilateral: Secondary | ICD-10-CM | POA: Diagnosis not present

## 2023-11-26 DIAGNOSIS — Z961 Presence of intraocular lens: Secondary | ICD-10-CM | POA: Diagnosis not present

## 2023-11-26 DIAGNOSIS — H33333 Multiple defects of retina without detachment, bilateral: Secondary | ICD-10-CM | POA: Diagnosis not present

## 2023-11-26 MED ORDER — PREDNISOLONE ACETATE 1 % OP SUSP: 1.0000 [drp] | Freq: Four times a day (QID) | OPHTHALMIC | 0 refills | Status: AC

## 2023-11-26 NOTE — Progress Notes (Signed)
[facility] - Clinic Note  11/26/2023   CHIEF COMPLAINT Patient presents for Retina Evaluation  HISTORY OF PRESENT ILLNESS: Barbara [name] is a 72 y.o. female who presents to the clinic today for:  HPI     Retina Evaluation   In right eye.  This started 1 day ago.  Duration of 1 day.  I, the attending physician,  performed the HPI with the patient and updated documentation appropriately.        Comments   New pt here for HST OD referred by Dr. Bascom Levels. Pt states she was in for annual exam w/ Dr. Bascom Levels. Does not report any vision changes, no floaters or FOL. Pt has a hx of HTN, COPD, Discord Lupus (dormant currently), dry eyes OU. She does report taking Retstasis BID OU. Pt is pseudophakic OU, wears rx specs for distance and has readers.       Last edited by Rennis Chris, MD on 11/26/2023 12:17 PM.    Patient denies seeing flashes, floaters or noticing any changes in vision.   Referring physician: Frazier, [place], OD [address] [place],  [place] [address]  HISTORICAL INFORMATION:  Selected notes from the MEDICAL RECORD NUMBER Referred by Dr. Bascom Levels for retinal tear OD LEE:  Ocular Hx- PMH-   CURRENT MEDICATIONS: Current Outpatient Medications (Ophthalmic Drugs)  Medication Sig   prednisoLONE acetate (PRED FORTE) 1 % ophthalmic suspension Place 1 drop into the right eye 4 (four) times daily for 7 days.   RESTASIS 0.05 % ophthalmic emulsion 1 drop 2 (two) times daily.   No current facility-administered medications for this visit. (Ophthalmic Drugs)   Current Outpatient Medications (Other)  Medication Sig   acetaminophen (TYLENOL) 500 MG tablet Take 1,000 mg by mouth in the morning and at bedtime.   albuterol (PROVENTIL HFA;VENTOLIN HFA) 108 (90 BASE) MCG/ACT inhaler Inhale 2 puffs into the lungs every 6 (six) hours as needed. For shortness of breath   albuterol (PROVENTIL) (2.5 MG/3ML) 0.083% nebulizer solution Take 3 mLs (2.5 mg total)  by nebulization every 6 (six) hours as needed. Breathing   amLODipine (NORVASC) 10 MG tablet Take 10 mg by mouth daily.   atorvastatin (LIPITOR) 10 MG tablet Take 10 mg by mouth daily.   azelastine (ASTELIN) 0.1 % nasal spray Place 1 spray into both nostrils 2 (two) times daily. Use in each nostril as directed   azelastine (ASTELIN) 0.1 % nasal spray Place 1 spray into both nostrils 2 (two) times daily. Use in each nostril as directed   Calcium Carbonate-Vit D-Min (CALCIUM 1200 PO) Take 1 tablet by mouth daily.   Cholecalciferol (VITAMIN D3) 50 MCG (2000 UT) TABS Take by mouth.   Cod Liver Oil CAPS Take 1 capsule by mouth daily.   Cyanocobalamin (VITAMIN B 12 PO) Take 250 mcg by mouth daily.   Dupilumab (DUPIXENT) 300 MG/2ML SOPN Inject 300 mg into the skin every 14 (fourteen) days.   fluticasone-salmeterol (ADVAIR) 500-50 MCG/ACT AEPB INHALE 1 PUFF INTO THE LUNGS IN THE MORNING AND AT BEDTIME.   hydrochlorothiazide (HYDRODIURIL) 25 MG tablet Take 25 mg by mouth daily.   levocetirizine (XYZAL) 5 MG tablet Take 1 tablet (5 mg total) by mouth every evening.   montelukast (SINGULAIR) 10 MG tablet TAKE 1 TABLET (10 MG TOTAL) BY MOUTH AT BEDTIME.   Omega-3 Fatty Acids (FISH OIL) 1200 MG CAPS Take 1 capsule by mouth daily.   pantoprazole (PROTONIX) 40 MG tablet TAKE 1 TABLET (40 MG  TOTAL) BY MOUTH DAILY.   Tiotropium Bromide Monohydrate (SPIRIVA RESPIMAT) 2.5 MCG/ACT AERS Inhale 2 each into the lungs every evening.   TURMERIC CURCUMIN PO Take 1 capsule by mouth daily.   No current facility-administered medications for this visit. (Other)   REVIEW OF SYSTEMS: ROS   Positive for: Neurological, Genitourinary, Cardiovascular, Eyes, Respiratory Negative for: Constitutional, Gastrointestinal, Skin, Musculoskeletal, HENT, Endocrine, Psychiatric, Allergic/Imm, Heme/Lymph Last edited by Thompson Grayer, COT on 11/26/2023  8:12 AM.     ALLERGIES Allergies  Allergen Reactions   Nsaids Shortness Of  Breath   Clonidine Derivatives     Coughing shortness of breath   Codeine Other (See Comments)    Reaction: hallucination    Ibuprofen     Other reaction(s): "NSAIDS" took breath away   Lisinopril Hives   Penicillins Hives   Vicodin [Hydrocodone-Acetaminophen] Nausea And Vomiting   Procaine Rash   PAST MEDICAL HISTORY Past Medical History:  Diagnosis Date   Arthritis    right knee, neck   Asthma    Chronic kidney disease    COPD (chronic obstructive pulmonary disease) (HCC)    Hypertension    Mouth trouble    infection from tooth that was pulled approx. mid Mar. 2013   Past Surgical History:  Procedure Laterality Date   ABDOMINAL HYSTERECTOMY     SHOULDER SURGERY  in age 58's   cyst removed from [place] socket of left shoulder   TONSILLECTOMY  age 65   TUBAL LIGATION     FAMILY HISTORY Family History  Problem Relation Age of Onset   Dementia Mother    Kidney disease Mother    Glaucoma Mother    Cataracts Mother    Asthma Sister    Mental illness Brother    Glaucoma Paternal Aunt    Blindness Paternal Aunt    Breast cancer Neg Hx    SOCIAL HISTORY Social History   Tobacco Use   Smoking status: Former    Current packs/day: 0.00    Average packs/day: 1 pack/day for 45.0 years (45.0 ttl pk-yrs)    Types: Cigarettes    Start date: 10/26/1966    Quit date: 10/27/2011    Years since quitting: 12.0   Smokeless tobacco: Never  Vaping Use   Vaping status: Never Used  Substance Use Topics   Alcohol use: No   Drug use: No       OPHTHALMIC EXAM:  Base Eye Exam     Visual Acuity (Snellen - Linear)       Right Left   Dist cc 20/20 20/20         Tonometry (Tonopen, 8:21 AM)       Right Left   Pressure 13 15         Pupils       Pupils Dark Light Shape React APD   Right PERRL 3 2 Round Brisk None   Left PERRL 3 2 Round Brisk None         Visual Fields (Counting fingers)       Left Right    Full Full         Extraocular Movement        Right Left    Full, Ortho Full, Ortho         Neuro/Psych     Oriented x3: Yes   Mood/Affect: Normal         Dilation     Both eyes: 1.0% Mydriacyl, 2.5% Phenylephrine @ 8:23 AM  Slit Lamp and Fundus Exam     External Exam       Right Left   External Normal Normal         Slit Lamp Exam       Right Left   Lids/Lashes Dermatochalasis - upper lid Dermatochalasis - upper lid   Conjunctiva/Sclera Melanosis Melanosis   Cornea Arcus, 1+ Punctate epithelial erosions, Debris in tear film, Well healed cataract wound Arcus, Debris in tear film, 1-2+ Punctate epithelial erosions, Well healed cataract wound   Anterior Chamber Deep and clear Deep and clear   Iris Round and dilated Round and dilated   Lens PC IOL in good postition PC IOL in good postition   Anterior Vitreous Vitreous syneresis, Posterior vitreous detachment Vitreous syneresis         Fundus Exam       Right Left   Disc Trace pallor sherp rim Trace pallor sherp rim   C/D Ratio 0.3 0.3   Macula Flat, Blunted foveal reflex, RPE mottling, No heme or edema Flat, Good foveal reflex, No heme or edema   Vessels Mild Vascular attenuation, Tortuous Mild Vascular attenuation, Tortuous   Periphery Attached, small HST with pigmented demarcation line surrounding at 1000, retinal holes w/ pigment surrounding  from 0830-0900; no SRF Attached; atrophic hole 0130 with pigment surrounding, HST at 0330 with pigmented demarcation line, HST 0600 with pigmented demarcation line           Refraction     Wearing Rx       Sphere Cylinder Axis   Right -0.75 +1.25 037   Left -1.75 +0.75 126           IMAGING AND PROCEDURES  Imaging and Procedures for 11/26/2023  OCT, Retina - OU - Both Eyes       Right Eye Quality was good. Central Foveal Thickness: 263. Progression has no prior data. Findings include normal foveal contour, no IRF, no SRF.   Left Eye Quality was good. Central Foveal Thickness: 240.  Progression has no prior data. Findings include normal foveal contour, no IRF, no SRF.   Notes *Images captured and stored on drive  Diagnosis / Impression:  NFP; no IRF/SRF OU  Clinical management:  See below  Abbreviations: NFP - Normal foveal profile. CME - cystoid macular edema. PED - pigment epithelial detachment. IRF - intraretinal fluid. SRF - subretinal fluid. EZ - ellipsoid zone. ERM - epiretinal membrane. ORA - outer retinal atrophy. ORT - outer retinal tubulation. SRHM - subretinal hyper-reflective material. IRHM - intraretinal hyper-reflective material      Repair Retinal Breaks, Laser - OD - Right Eye       LASER PROCEDURE NOTE  Procedure:  Barrier laser retinopexy using slit lamp laser, RIGHT eye   Diagnosis:   Retinal breaks, RIGHT eye                     Retinal holes with pigment surrounding from 0830-0900; small horseshoe tear w/ pigment surrounding at 1000 o'clock anterior to equator   Surgeon: Rennis Chris, MD, PhD  Anesthesia: Topical  Informed consent obtained, operative eye marked, and time out performed prior to initiation of laser.   Laser settings:  Lumenis Smart532 laser, slit lamp Lens: Mainster PRP 165 Power: 260 mW Spot size: 200 microns Duration: 30 msec  # spots: 334  Placement of laser: Using a Mainster PRP 165 contact lens at the slit lamp, laser was placed in three confluent rows around retinal holes  from 0830-0900 oclock and around flap tear at 1000 oclock, anterior to equator.  Complications: None.  Patient tolerated the procedure well and received written and verbal post-procedure care information/education.          ASSESSMENT/PLAN:   ICD-10-CM   1. 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 Essential hypertension  I10     3. Hypertensive retinopathy of both eyes  H35.033 OCT, Retina - OU - Both Eyes    4. Pseudophakia,  both eyes  Z96.1     5. Bilateral dry eyes  H04.123      1. Retinal breaks OU - The incidence, risk factors, and natural history of retinal tear was discussed with patient.   - Potential treatment options including laser retinopexy and cryotherapy discussed with patient.  - pt with multiple, chronic appearing retinal breaks OU OD: small HST with pigmented demarcation line surrounding at 1000, retinal holes w/ pigment surrounding  from 0830-0900; no SRF OS: atrophic hole 0130 with pigmented surrounding, HST at 0330 with pigmented demarcation line, HST 0600 with pigmented demarcation line - recommend laser retinopexy OD today 12.19.24 - recommend laser retinopexy OS in 3-4 weeks - RBA of procedure discussed, questions answered - informed consent obtained and signed  - see procedure note - start PF QID x7 days- sent to pharmacy on file - f/u in 3-4 wks - DFE, OCT, possible laser retinopexy OS   2,3. Hypertensive retinopathy OU - discussed importance of tight BP control - monitor   4. Pseudophakia OU  - s/p CE/IOL OU w/ Dr. Elmer Picker  - IOL in good position, doing well  - monitor  5.  Dry eyes OU - recommend artificial tears and lubricating ointment as needed  - currently using Restasis  Ophthalmic Meds Ordered this visit:  Meds ordered this encounter  Medications   prednisoLONE acetate (PRED FORTE) 1 % ophthalmic suspension    Sig: Place 1 drop into the right eye 4 (four) times daily for 7 days.    Dispense:  1.4 mL    Refill:  0     Return in about 4 weeks (around 12/24/2023) for f/u Retinal tears OU , DFE, OCT, Possible, LASER RETINOPEXY, OS.  There are no Patient Instructions on file for this visit.  Explained the diagnoses, plan, and follow up with the patient and they expressed understanding.  Patient expressed understanding of the importance of proper follow up care.   This document serves as a record of services personally performed by Karie Chimera, MD, PhD. It was  created on their behalf by Charlette Caffey, COT an ophthalmic technician. The creation of this record is the provider's dictation and/or activities during the visit.    Electronically signed by:  Charlette Caffey, COT  11/26/23 12:29 PM  Karie Chimera, M.D., Ph.D. Diseases & Surgery of the Retina and Vitreous [facility] 11/26/2023  I have reviewed the above documentation for accuracy and completeness, and I agree with the above. Karie Chimera, M.D., Ph.D. 11/26/23 12:29 PM   Abbreviations: M myopia (nearsighted); A astigmatism; H hyperopia (farsighted); P presbyopia; Mrx spectacle prescription;  CTL contact lenses; OD right eye; OS left eye; OU both eyes  XT exotropia; ET esotropia; PEK punctate epithelial keratitis; PEE punctate epithelial erosions; DES dry eye syndrome; MGD meibomian gland dysfunction; ATs artificial tears;  PFAT's preservative free artificial tears; NSC nuclear sclerotic cataract; PSC posterior subcapsular cataract; ERM epi-retinal membrane; PVD posterior vitreous detachment; RD retinal detachment; DM diabetes mellitus; DR diabetic retinopathy; NPDR non-proliferative diabetic retinopathy; PDR proliferative diabetic retinopathy; CSME clinically significant macular edema; DME diabetic macular edema; dbh dot blot hemorrhages; CWS cotton wool spot; POAG primary open angle glaucoma; C/D cup-to-disc ratio; HVF humphrey visual field; GVF goldmann visual field; OCT optical coherence tomography; IOP intraocular pressure; BRVO Branch retinal vein occlusion; CRVO central retinal vein occlusion; CRAO central retinal artery occlusion; BRAO branch retinal artery occlusion; RT retinal tear; SB scleral buckle; PPV pars plana vitrectomy; VH Vitreous hemorrhage; PRP panretinal laser photocoagulation; IVK intravitreal kenalog; VMT vitreomacular traction; MH Macular hole;  NVD neovascularization of the disc; NVE neovascularization elsewhere; AREDS age related eye  disease study; ARMD age related macular degeneration; POAG primary open angle glaucoma; EBMD epithelial/anterior basement membrane dystrophy; ACIOL anterior chamber intraocular lens; IOL intraocular lens; PCIOL posterior chamber intraocular lens; Phaco/IOL phacoemulsification with intraocular lens placement; PRK photorefractive keratectomy; LASIK laser assisted in situ keratomileusis; HTN hypertension; DM diabetes mellitus; COPD chronic obstructive pulmonary disease

## 2023-11-30 NOTE — Telephone Encounter (Signed)
Submitted Patient Assistance Application to [employer] for DUPIXENT along with provider portion, patient portion, PA, medication list, and insurance card copy. Will update patient when we receive a response.  Phone #: [phone] Fax #: [phone]

## 2023-12-16 DIAGNOSIS — M7061 Trochanteric bursitis, right hip: Secondary | ICD-10-CM | POA: Diagnosis not present

## 2023-12-17 NOTE — Progress Notes (Signed)
 [facility] - Clinic Note  12/18/2023   CHIEF COMPLAINT Patient presents for Retina Follow Up  HISTORY OF PRESENT ILLNESS: Barbara [name] is a 73 y.o. female who presents to the clinic today for:  HPI     Retina Follow Up   Patient presents with  Other.  In both eyes.  Duration of 4 weeks.  I, the attending physician,  performed the HPI with the patient and updated documentation appropriately.        Comments   Patient feels the vision is the same. She is complaining of dry eyes. She is using Restasis  OU BID and AT's.      Last edited by Valdemar Rogue, MD on 12/18/2023 12:36 PM.    Patient feels the eyes are dry and the vision is the same.   Referring physician: Frazier, Chad, OD [address] [place],  [place] [address]  HISTORICAL INFORMATION:  Selected notes from the MEDICAL RECORD NUMBER Referred by Dr. Vivian for retinal tear OD LEE:  Ocular Hx- PMH-   CURRENT MEDICATIONS: Current Outpatient Medications (Ophthalmic Drugs)  Medication Sig   RESTASIS  0.05 % ophthalmic emulsion 1 drop 2 (two) times daily.   No current facility-administered medications for this visit. (Ophthalmic Drugs)   Current Outpatient Medications (Other)  Medication Sig   acetaminophen  (TYLENOL ) 500 MG tablet Take 1,000 mg by mouth in the morning and at bedtime.   albuterol  (PROVENTIL  HFA;VENTOLIN  HFA) 108 (90 BASE) MCG/ACT inhaler Inhale 2 puffs into the lungs every 6 (six) hours as needed. For shortness of breath   albuterol  (PROVENTIL ) (2.5 MG/3ML) 0.083% nebulizer solution Take 3 mLs (2.5 mg total) by nebulization every 6 (six) hours as needed. Breathing   amLODipine  (NORVASC ) 10 MG tablet Take 10 mg by mouth daily.   atorvastatin  (LIPITOR) 10 MG tablet Take 10 mg by mouth daily.   azelastine  (ASTELIN ) 0.1 % nasal spray Place 1 spray into both nostrils 2 (two) times daily. Use in each nostril as directed   azelastine  (ASTELIN ) 0.1 % nasal spray Place 1 spray  into both nostrils 2 (two) times daily. Use in each nostril as directed   Calcium  Carbonate-Vit D-Min (CALCIUM  1200 PO) Take 1 tablet by mouth daily.   Cholecalciferol (VITAMIN D3) 50 MCG (2000 UT) TABS Take by mouth.   Cod Liver Oil CAPS Take 1 capsule by mouth daily.   Cyanocobalamin (VITAMIN B 12 PO) Take 250 mcg by mouth daily.   Dupilumab  (DUPIXENT ) 300 MG/2ML SOPN Inject 300 mg into the skin every 14 (fourteen) days.   fluticasone -salmeterol (ADVAIR ) 500-50 MCG/ACT AEPB INHALE 1 PUFF INTO THE LUNGS IN THE MORNING AND AT BEDTIME.   hydrochlorothiazide  (HYDRODIURIL ) 25 MG tablet Take 25 mg by mouth daily.   levocetirizine (XYZAL ) 5 MG tablet Take 1 tablet (5 mg total) by mouth every evening.   montelukast  (SINGULAIR ) 10 MG tablet TAKE 1 TABLET AT BEDTIME   Omega-3 Fatty Acids (FISH OIL) 1200 MG CAPS Take 1 capsule by mouth daily.   pantoprazole  (PROTONIX ) 40 MG tablet TAKE 1 TABLET (40 MG TOTAL) BY MOUTH DAILY.   Tiotropium Bromide  Monohydrate (SPIRIVA  RESPIMAT) 2.5 MCG/ACT AERS Inhale 2 each into the lungs every evening.   TURMERIC CURCUMIN PO Take 1 capsule by mouth daily.   No current facility-administered medications for this visit. (Other)   REVIEW OF SYSTEMS: ROS   Positive for: Neurological, Genitourinary, Cardiovascular, Eyes, Respiratory Negative for: Constitutional, Gastrointestinal, Skin, Musculoskeletal, HENT, Endocrine, Psychiatric, Allergic/Imm, Heme/Lymph Last  edited by Myra Wanda SAILOR, COT on 12/18/2023  8:00 AM.      ALLERGIES Allergies  Allergen Reactions   Nsaids Shortness Of Breath   Clonidine Derivatives     Coughing shortness of breath   Codeine Other (See Comments)    Reaction: hallucination    Ibuprofen     Other reaction(s): NSAIDS took breath away   Lisinopril Hives   Penicillins Hives   Vicodin [Hydrocodone -Acetaminophen ] Nausea And Vomiting   Procaine Rash   PAST MEDICAL HISTORY Past Medical History:  Diagnosis Date   Arthritis    right  knee, neck   Asthma    Chronic kidney disease    COPD (chronic obstructive pulmonary disease) (HCC)    Hypertension    Mouth trouble    infection from tooth that was pulled approx. mid Mar. 2013   Past Surgical History:  Procedure Laterality Date   ABDOMINAL HYSTERECTOMY     SHOULDER SURGERY  in age 62's   cyst removed from Bursa socket of left shoulder   TONSILLECTOMY  age 74   TUBAL LIGATION     FAMILY HISTORY Family History  Problem Relation Age of Onset   Dementia Mother    Kidney disease Mother    Glaucoma Mother    Cataracts Mother    Asthma Sister    Mental illness Brother    Glaucoma Paternal Aunt    Blindness Paternal Aunt    Breast cancer Neg Hx    SOCIAL HISTORY Social History   Tobacco Use   Smoking status: Former    Current packs/day: 0.00    Average packs/day: 1 pack/day for 45.0 years (45.0 ttl pk-yrs)    Types: Cigarettes    Start date: 10/26/1966    Quit date: 10/27/2011    Years since quitting: 12.1   Smokeless tobacco: Never  Vaping Use   Vaping status: Never Used  Substance Use Topics   Alcohol  use: No   Drug use: No       OPHTHALMIC EXAM:  Base Eye Exam     Visual Acuity (Snellen - Linear)       Right Left   Dist [place] 20/25 20/30   Dist ph  20/20 20/20  Forgot glasses        Tonometry (Tonopen, 8:04 AM)       Right Left   Pressure 13 12         Pupils       Dark Light Shape React APD   Right 3 2 Round Brisk None   Left 3 2 Round Brisk None         Visual Fields       Left Right    Full Full         Extraocular Movement       Right Left    Full, Ortho Full, Ortho         Neuro/Psych     Oriented x3: Yes   Mood/Affect: Normal         Dilation     Both eyes: 1.0% Mydriacyl, 2.5% Phenylephrine @ 8:01 AM           Slit Lamp and Fundus Exam     External Exam       Right Left   External Normal Normal         Slit Lamp Exam       Right Left   Lids/Lashes Dermatochalasis - upper  lid Dermatochalasis - upper lid  Conjunctiva/Sclera Melanosis Melanosis   Cornea Arcus, 1+ Punctate epithelial erosions, Debris in tear film, Well healed cataract wound Arcus, Debris in tear film, 1-2+ Punctate epithelial erosions, Well healed cataract wound   Anterior Chamber Deep and clear Deep and clear   Iris Round and dilated Round and dilated   Lens PC IOL in good postition, Mild Posterior capsular opacification PC IOL in good postition   Anterior Vitreous Vitreous syneresis, Posterior vitreous detachment Vitreous syneresis         Fundus Exam       Right Left   Disc Trace pallor, sharp rim Trace pallor sharp rim   C/D Ratio 0.3 0.3   Macula Flat, Blunted foveal reflex, RPE mottling, No heme or edema Flat, Good foveal reflex, No heme or edema   Vessels Mild Vascular attenuation, Tortuous Mild Vascular attenuation, Tortuous   Periphery Attached, small HST with pigmented demarcation line surrounding at 1000, retinal holes w/ pigment surrounding from 0830-0900; no SRF, good laser changes surrounding all lesions Attached; atrophic hole 0130 with pigment surrounding, HST at 0330 with pigmented demarcation line, HST 0600 with pigmented demarcation line           Refraction     Wearing Rx       Sphere Cylinder Axis   Right -0.75 +1.25 037   Left -1.75 +0.75 126           IMAGING AND PROCEDURES  Imaging and Procedures for 12/18/2023  OCT, Retina - OU - Both Eyes       Right Eye Quality was good. Central Foveal Thickness: 269. Progression has been stable. Findings include normal foveal contour, no IRF, no SRF.   Left Eye Quality was good. Central Foveal Thickness: 246. Progression has been stable. Findings include normal foveal contour, no IRF, no SRF.   Notes *Images captured and stored on drive  Diagnosis / Impression:  NFP; no IRF/SRF OU  Clinical management:  See below  Abbreviations: NFP - Normal foveal profile. CME - cystoid macular edema. PED - pigment  epithelial detachment. IRF - intraretinal fluid. SRF - subretinal fluid. EZ - ellipsoid zone. ERM - epiretinal membrane. ORA - outer retinal atrophy. ORT - outer retinal tubulation. SRHM - subretinal hyper-reflective material. IRHM - intraretinal hyper-reflective material      Repair Retinal Breaks, Laser - OS - Left Eye       LASER PROCEDURE NOTE  Procedure:  Barrier laser retinopexy using slit lamp laser, LEFT eye   Diagnosis:   Retinal breaks, LEFT eye                     atrophic hole at 0130 with pigment surrounding, HST at 0330 with pigmented demarcation line, HST 0600 with pigmented demarcation line  Surgeon: Redell Hans, MD, PhD  Anesthesia: Topical  Informed consent obtained, operative eye marked, and time out performed prior to initiation of laser.   Laser settings:  Lumenis Smart532 laser, slit lamp Lens: Mainster PRP 165 Power: 270 mW Spot size: 200 microns Duration: 30 msec  # spots: 614  Placement of laser: Using a Mainster PRP 165 contact lens at the slit lamp, laser was placed in three confluent rows around atrophic hole 0130, HSTs at 0330 and 0600, anterior to equator  Complications: None.  Patient tolerated the procedure well and received written and verbal post-procedure care information/education.           ASSESSMENT/PLAN:   ICD-10-CM   1. 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 Essential hypertension  I10     3. Hypertensive retinopathy of both eyes  H35.033 OCT, Retina - OU - Both Eyes    4. Pseudophakia, both eyes  Z96.1     5. Bilateral dry eyes  H04.123       1. Retinal breaks OU   - pt with multiple, chronic appearing retinal breaks OU OD: small HST with pigmented demarcation line surrounding at 1000, retinal holes w/ pigment surrounding  from 0830-0900; no SRF OS: atrophic hole 0130 with pigmented surrounding, HST at 0330 with  pigmented demarcation line, HST 0600 with pigmented demarcation line - s/p Laser retinopexy OD 12.19.24 -- good laser changes in place - recommend laser retinopexy OS today 01.10.25 - RBA of procedure discussed, questions answered - informed consent obtained and signed  - see procedure note - start PF QID OS x7 days- has rx from first laser - f/u in 3-4 wks - DFE, OCT  2,3. Hypertensive retinopathy OU - discussed importance of tight BP control - monitor   4. Pseudophakia OU  - s/p CE/IOL OU w/ Dr. Cleatus  - IOL in good position, doing well  - monitor  5.  Dry eyes OU - recommend artificial tears and lubricating ointment as needed  - currently using Restasis   Ophthalmic Meds Ordered this visit:  No orders of the defined types were placed in this encounter.    Return in about 4 weeks (around 01/15/2024) for f/u Retinal breaks OU s/p LRpexy OS, DFE, OCT.  There are no Patient Instructions on file for this visit. This document serves as a record of services personally performed by Redell JUDITHANN Hans, MD, PhD. It was created on their behalf by Delon Newness COT, an ophthalmic technician. The creation of this record is the provider's dictation and/or activities during the visit.    Electronically signed by: Delon Newness COT 01.09.25 12:54 PM  This document serves as a record of services personally performed by Redell JUDITHANN Hans, MD, PhD. It was created on their behalf by Wanda GEANNIE Keens, COT an ophthalmic technician. The creation of this record is the provider's dictation and/or activities during the visit.    Electronically signed by:  Wanda GEANNIE Keens, COT  12/18/23 12:54 PM  Redell JUDITHANN Hans, M.D., Ph.D. Diseases & Surgery of the Retina and Vitreous [facility] 12/18/2023   I have reviewed the above documentation for accuracy and completeness, and I agree with the above. Redell JUDITHANN Hans, M.D., Ph.D. 12/18/23 12:55 PM  Abbreviations: M  myopia (nearsighted); A astigmatism; H hyperopia (farsighted); P presbyopia; Mrx spectacle prescription;  CTL contact lenses; OD right eye; OS left eye; OU both eyes  XT exotropia; ET esotropia; PEK punctate epithelial keratitis; PEE punctate epithelial erosions; DES dry eye syndrome; MGD meibomian gland dysfunction; ATs artificial tears; PFAT's preservative free artificial tears; NSC nuclear sclerotic cataract; PSC posterior subcapsular cataract; ERM epi-retinal membrane; PVD posterior vitreous detachment; RD retinal detachment; DM diabetes mellitus; DR diabetic retinopathy; NPDR non-proliferative diabetic retinopathy; PDR proliferative diabetic retinopathy; CSME clinically significant macular edema; DME diabetic macular edema; dbh dot blot hemorrhages; CWS cotton wool spot; POAG primary open angle glaucoma; C/D cup-to-disc ratio; HVF humphrey visual field; GVF goldmann visual field; OCT optical coherence tomography; IOP intraocular pressure; BRVO Branch retinal vein occlusion; CRVO central retinal vein occlusion; CRAO central retinal artery  occlusion; BRAO branch retinal artery occlusion; RT retinal tear; SB scleral buckle; PPV pars plana vitrectomy; VH Vitreous hemorrhage; PRP panretinal laser photocoagulation; IVK intravitreal kenalog; VMT vitreomacular traction; MH Macular hole;  NVD neovascularization of the disc; NVE neovascularization elsewhere; AREDS age related eye disease study; ARMD age related macular degeneration; POAG primary open angle glaucoma; EBMD epithelial/anterior basement membrane dystrophy; ACIOL anterior chamber intraocular lens; IOL intraocular lens; PCIOL posterior chamber intraocular lens; Phaco/IOL phacoemulsification with intraocular lens placement; PRK photorefractive keratectomy; LASIK laser assisted in situ keratomileusis; HTN hypertension; DM diabetes mellitus; COPD chronic obstructive pulmonary disease

## 2023-12-18 ENCOUNTER — Encounter (INDEPENDENT_AMBULATORY_CARE_PROVIDER_SITE_OTHER): Payer: Self-pay | Admitting: Ophthalmology

## 2023-12-18 ENCOUNTER — Ambulatory Visit (INDEPENDENT_AMBULATORY_CARE_PROVIDER_SITE_OTHER): Payer: Medicare HMO | Admitting: Ophthalmology

## 2023-12-18 DIAGNOSIS — I10 Essential (primary) hypertension: Secondary | ICD-10-CM

## 2023-12-18 DIAGNOSIS — H33333 Multiple defects of retina without detachment, bilateral: Secondary | ICD-10-CM

## 2023-12-18 DIAGNOSIS — H35033 Hypertensive retinopathy, bilateral: Secondary | ICD-10-CM | POA: Diagnosis not present

## 2023-12-18 DIAGNOSIS — H04123 Dry eye syndrome of bilateral lacrimal glands: Secondary | ICD-10-CM

## 2023-12-18 DIAGNOSIS — Z961 Presence of intraocular lens: Secondary | ICD-10-CM

## 2023-12-22 NOTE — Telephone Encounter (Signed)
 Pt called me yesterday and stated that [employer] was requiring that she send in financial documentation in order to complete her re-enrollment. She came to the clinic today to drop them off. I have received them and have faxed them to [employer]. Will await further correspondence.

## 2023-12-24 ENCOUNTER — Other Ambulatory Visit: Payer: Self-pay | Admitting: Pharmacist

## 2023-12-24 DIAGNOSIS — J4489 Other specified chronic obstructive pulmonary disease: Secondary | ICD-10-CM

## 2023-12-24 MED ORDER — DUPIXENT 300 MG/2ML ~~LOC~~ SOAJ: 300.0000 mg | SUBCUTANEOUS | 1 refills | Status: DC

## 2023-12-29 ENCOUNTER — Other Ambulatory Visit (HOSPITAL_COMMUNITY): Payer: Self-pay

## 2023-12-30 ENCOUNTER — Telehealth: Payer: Self-pay | Admitting: *Deleted

## 2023-12-30 DIAGNOSIS — J4489 Other specified chronic obstructive pulmonary disease: Secondary | ICD-10-CM

## 2023-12-30 MED ORDER — DUPIXENT 300 MG/2ML ~~LOC~~ SOAJ: 300.0000 mg | SUBCUTANEOUS | 1 refills | Status: DC

## 2023-12-30 NOTE — Telephone Encounter (Signed)
Received fax from [facility] pharmacy and the medication name was not legible, it was for Dupixent, however the dose and frequency cannot be read.  Pharmacy team, please advise.  Thank you.

## 2023-12-30 NOTE — Telephone Encounter (Signed)
Patient called Barbara [name] and advised him that she was notified of [employer] patient assistance approval through 12/07/2024. Nothing further should be needed at this time.  Chesley Mires, PharmD, MPH, BCPS, CPP Clinical Pharmacist (Rheumatology and Pulmonology)

## 2023-12-30 NOTE — Telephone Encounter (Signed)
Rx sent to [facility] today for Dupixent  Chesley Mires, PharmD, MPH, BCPS, CPP Clinical Pharmacist (Rheumatology and Pulmonology)

## 2024-01-07 NOTE — Progress Notes (Signed)
[facility] - Clinic Note  01/08/2024   CHIEF COMPLAINT Patient presents for Retina Follow Up  HISTORY OF PRESENT ILLNESS: Barbara [name] is a 73 y.o. female who presents to the clinic today for:  HPI     Retina Follow Up   In left eye.  This started 3 weeks ago.  Duration of 3 weeks.  Since onset it is stable.  I, the attending physician,  performed the HPI with the patient and updated documentation appropriately.        Comments   3 week retina follow up break s/p pexy OS pt is reporting no vision changes other that now seeing floaters in OS she denies any flashes       Last edited by Rennis Chris, MD on 01/09/2024  5:10 PM.    Patient reports she is seeing floaters OS  Referring physician: [place], [place], OD [address] [place],  [place] [address]  HISTORICAL INFORMATION:  Selected notes from the MEDICAL RECORD NUMBER Referred by Dr. Bascom Levels for retinal tear OD LEE:  Ocular Hx- PMH-   CURRENT MEDICATIONS: Current Outpatient Medications (Ophthalmic Drugs)  Medication Sig   RESTASIS 0.05 % ophthalmic emulsion 1 drop 2 (two) times daily.   No current facility-administered medications for this visit. (Ophthalmic Drugs)   Current Outpatient Medications (Other)  Medication Sig   acetaminophen (TYLENOL) 500 MG tablet Take 1,000 mg by mouth in the morning and at bedtime.   albuterol (PROVENTIL HFA;VENTOLIN HFA) 108 (90 BASE) MCG/ACT inhaler Inhale 2 puffs into the lungs every 6 (six) hours as needed. For shortness of breath   albuterol (PROVENTIL) (2.5 MG/3ML) 0.083% nebulizer solution Take 3 mLs (2.5 mg total) by nebulization every 6 (six) hours as needed. Breathing   amLODipine (NORVASC) 10 MG tablet Take 10 mg by mouth daily.   atorvastatin (LIPITOR) 10 MG tablet Take 10 mg by mouth daily.   azelastine (ASTELIN) 0.1 % nasal spray Place 1 spray into both nostrils 2 (two) times daily. Use in each nostril as directed   azelastine (ASTELIN)  0.1 % nasal spray Place 1 spray into both nostrils 2 (two) times daily. Use in each nostril as directed   Calcium Carbonate-Vit D-Min (CALCIUM 1200 PO) Take 1 tablet by mouth daily.   Cholecalciferol (VITAMIN D3) 50 MCG (2000 UT) TABS Take by mouth.   Cod Liver Oil CAPS Take 1 capsule by mouth daily.   Cyanocobalamin (VITAMIN B 12 PO) Take 250 mcg by mouth daily.   Dupilumab (DUPIXENT) 300 MG/2ML SOAJ Inject 300 mg into the skin every 14 (fourteen) days.   fluticasone-salmeterol (ADVAIR) 500-50 MCG/ACT AEPB INHALE 1 PUFF INTO THE LUNGS IN THE MORNING AND AT BEDTIME.   hydrochlorothiazide (HYDRODIURIL) 25 MG tablet Take 25 mg by mouth daily.   levocetirizine (XYZAL) 5 MG tablet Take 1 tablet (5 mg total) by mouth every evening.   montelukast (SINGULAIR) 10 MG tablet TAKE 1 TABLET AT BEDTIME   Omega-3 Fatty Acids (FISH OIL) 1200 MG CAPS Take 1 capsule by mouth daily.   pantoprazole (PROTONIX) 40 MG tablet TAKE 1 TABLET (40 MG TOTAL) BY MOUTH DAILY.   Tiotropium Bromide Monohydrate (SPIRIVA RESPIMAT) 2.5 MCG/ACT AERS Inhale 2 each into the lungs every evening.   TURMERIC CURCUMIN PO Take 1 capsule by mouth daily.   No current facility-administered medications for this visit. (Other)   REVIEW OF SYSTEMS: ROS   Positive for: Neurological, Genitourinary, Cardiovascular, Eyes, Respiratory Negative for: Constitutional, Gastrointestinal, Skin,  Musculoskeletal, HENT, Endocrine, Psychiatric, Allergic/Imm, Heme/Lymph Last edited by Etheleen Mayhew, COT on 01/08/2024  1:27 PM.     ALLERGIES Allergies  Allergen Reactions   Nsaids Shortness Of Breath   Clonidine Derivatives     Coughing shortness of breath   Codeine Other (See Comments)    Reaction: hallucination    Ibuprofen     Other reaction(s): "NSAIDS" took breath away   Lisinopril Hives   Penicillins Hives   Vicodin [Hydrocodone-Acetaminophen] Nausea And Vomiting   Procaine Rash   PAST MEDICAL HISTORY Past Medical History:   Diagnosis Date   Arthritis    right knee, neck   Asthma    Chronic kidney disease    COPD (chronic obstructive pulmonary disease) (HCC)    Hypertension    Mouth trouble    infection from tooth that was pulled approx. mid Mar. 2013   Past Surgical History:  Procedure Laterality Date   ABDOMINAL HYSTERECTOMY     SHOULDER SURGERY  in age 55's   cyst removed from [place] socket of left shoulder   TONSILLECTOMY  age 37   TUBAL LIGATION     FAMILY HISTORY Family History  Problem Relation Age of Onset   Dementia Mother    Kidney disease Mother    Glaucoma Mother    Cataracts Mother    Asthma Sister    Mental illness Brother    Glaucoma Paternal Aunt    Blindness Paternal Aunt    Breast cancer Neg Hx    SOCIAL HISTORY Social History   Tobacco Use   Smoking status: Former    Current packs/day: 0.00    Average packs/day: 1 pack/day for 45.0 years (45.0 ttl pk-yrs)    Types: Cigarettes    Start date: 10/26/1966    Quit date: 10/27/2011    Years since quitting: 12.2   Smokeless tobacco: Never  Vaping Use   Vaping status: Never Used  Substance Use Topics   Alcohol use: No   Drug use: No       OPHTHALMIC EXAM:  Base Eye Exam     Visual Acuity (Snellen - Linear)       Right Left   Dist [place] 20/20 20/25   Dist ph cc  NI    Correction: Glasses         Tonometry (Tonopen, 1:35 PM)       Right Left   Pressure 13 15         Pupils       Pupils Dark Light Shape React APD   Right PERRL 3 2 Round Brisk None   Left PERRL 3 2 Round Brisk None         Visual Fields       Left Right    Full Full         Neuro/Psych     Oriented x3: Yes   Mood/Affect: Normal         Dilation     Both eyes: 2.5% Phenylephrine @ 1:35 PM           Slit Lamp and Fundus Exam     External Exam       Right Left   External Normal Normal         Slit Lamp Exam       Right Left   Lids/Lashes Dermatochalasis - upper lid Dermatochalasis - upper lid    Conjunctiva/Sclera Melanosis Melanosis   Cornea Arcus, 1+ Punctate epithelial erosions, tear film debris, well  healed cataract wound Arcus, 1-2+ Punctate epithelial erosions, tear film debris, well healed cataract wound   Anterior Chamber Deep and clear Deep and clear   Iris Round and dilated Round and dilated   Lens PC IOL in good postition, Mild Posterior capsular opacification PC IOL in good postition   Anterior Vitreous Vitreous syneresis, Posterior vitreous detachment Vitreous syneresis         Fundus Exam       Right Left   Disc Trace pallor, sharp rim Trace pallor, sharp rim   C/D Ratio 0.3 0.3   Macula Flat, Blunted foveal reflex, RPE mottling, No heme or edema Flat, Good foveal reflex, No heme or edema   Vessels Mild Vascular attenuation, Tortuous attenuated, mild tortuosity   Periphery Attached, small HST with pigmented demarcation line surrounding at 1000, retinal holes w/ pigment surrounding from 0830-0900; no SRF, good laser changes surrounding all lesions, no new RT/RD Attached; atrophic hole at 0130 with pigment surrounding, HST at 0330 with pigmented demarcation line, HST 0600 with pigmented demarcation line -- good laser surrounding all lesions, no new RT/RD           Refraction     Wearing Rx       Sphere Cylinder Axis   Right -0.75 +1.25 037   Left -1.75 +0.75 126           IMAGING AND PROCEDURES  Imaging and Procedures for 01/08/2024  OCT, Retina - OU - Both Eyes       Right Eye Quality was good. Central Foveal Thickness: 271. Progression has been stable. Findings include normal foveal contour, no IRF, no SRF.   Left Eye Quality was good. Central Foveal Thickness: 244. Progression has been stable. Findings include normal foveal contour, no IRF, no SRF.   Notes *Images captured and stored on drive  Diagnosis / Impression:  NFP; no IRF/SRF OU  Clinical management:  See below  Abbreviations: NFP - Normal foveal profile. CME - cystoid macular  edema. PED - pigment epithelial detachment. IRF - intraretinal fluid. SRF - subretinal fluid. EZ - ellipsoid zone. ERM - epiretinal membrane. ORA - outer retinal atrophy. ORT - outer retinal tubulation. SRHM - subretinal hyper-reflective material. IRHM - intraretinal hyper-reflective material           ASSESSMENT/PLAN:   ICD-10-CM   1. 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 Essential hypertension  I10     3. Hypertensive retinopathy of both eyes  H35.033 OCT, Retina - OU - Both Eyes    4. Pseudophakia, both eyes  Z96.1     5. Bilateral dry eyes  H04.123      1. Retinal breaks OU  - pt with multiple, chronic appearing retinal breaks OU OD: small HST with pigmented demarcation line surrounding at 1000, retinal holes w/ pigment surrounding  from 0830-0900; no SRF OS: atrophic hole 0130 with pigmented surrounding, HST at 0330 with pigmented demarcation line, HST 0600 with pigmented demarcation line - s/p laser retinopexy OD (12.19.24) -- good laser changes in place - s/p laser retinopexy OS (01.10.25) -- good laser changes in place - no new RT/RD - f/u in 3 months - DFE, OCT  2,3. Hypertensive retinopathy OU - discussed importance of tight BP control - monitor   4. Pseudophakia OU  - s/p CE/IOL OU w/ Dr. Elmer Picker  - IOL in good position, doing well  - monitor  5.  Dry  eyes OU - recommend artificial tears and lubricating ointment as needed  - currently using Restasis  Ophthalmic Meds Ordered this visit:  No orders of the defined types were placed in this encounter.    Return in about 3 months (around 04/06/2024) for f/u retinal break OU, DFE, OCT.  There are no Patient Instructions on file for this visit. This document serves as a record of services personally performed by Karie Chimera, MD, PhD. It was created on their behalf by Berlin Hun COT, an ophthalmic technician. The creation of this record is the  provider's dictation and/or activities during the visit.    Electronically signed by: Berlin Hun COT 01.30.255:10 PM  This document serves as a record of services personally performed by Karie Chimera, MD, PhD. It was created on their behalf by Glee Arvin. Manson Passey, OA an ophthalmic technician. The creation of this record is the provider's dictation and/or activities during the visit.    Electronically signed by: Glee Arvin. Manson Passey, OA 01/09/24 5:10 PM  Karie Chimera, M.D., Ph.D. Diseases & Surgery of the Retina and Vitreous [facility] 01/08/2024   I have reviewed the above documentation for accuracy and completeness, and I agree with the above. Karie Chimera, M.D., Ph.D. 01/09/24 5:12 PM   Abbreviations: M myopia (nearsighted); A astigmatism; H hyperopia (farsighted); P presbyopia; Mrx spectacle prescription;  CTL contact lenses; OD right eye; OS left eye; OU both eyes  XT exotropia; ET esotropia; PEK punctate epithelial keratitis; PEE punctate epithelial erosions; DES dry eye syndrome; MGD meibomian gland dysfunction; ATs artificial tears; PFAT's preservative free artificial tears; NSC nuclear sclerotic cataract; PSC posterior subcapsular cataract; ERM epi-retinal membrane; PVD posterior vitreous detachment; RD retinal detachment; DM diabetes mellitus; DR diabetic retinopathy; NPDR non-proliferative diabetic retinopathy; PDR proliferative diabetic retinopathy; CSME clinically significant macular edema; DME diabetic macular edema; dbh dot blot hemorrhages; CWS cotton wool spot; POAG primary open angle glaucoma; C/D cup-to-disc ratio; HVF humphrey visual field; GVF goldmann visual field; OCT optical coherence tomography; IOP intraocular pressure; BRVO Branch retinal vein occlusion; CRVO central retinal vein occlusion; CRAO central retinal artery occlusion; BRAO branch retinal artery occlusion; RT retinal tear; SB scleral buckle; PPV pars plana vitrectomy; VH Vitreous  hemorrhage; PRP panretinal laser photocoagulation; IVK intravitreal kenalog; VMT vitreomacular traction; MH Macular hole;  NVD neovascularization of the disc; NVE neovascularization elsewhere; AREDS age related eye disease study; ARMD age related macular degeneration; POAG primary open angle glaucoma; EBMD epithelial/anterior basement membrane dystrophy; ACIOL anterior chamber intraocular lens; IOL intraocular lens; PCIOL posterior chamber intraocular lens; Phaco/IOL phacoemulsification with intraocular lens placement; PRK photorefractive keratectomy; LASIK laser assisted in situ keratomileusis; HTN hypertension; DM diabetes mellitus; COPD chronic obstructive pulmonary disease

## 2024-01-08 ENCOUNTER — Ambulatory Visit (INDEPENDENT_AMBULATORY_CARE_PROVIDER_SITE_OTHER): Payer: Medicare HMO | Admitting: Ophthalmology

## 2024-01-08 ENCOUNTER — Encounter (INDEPENDENT_AMBULATORY_CARE_PROVIDER_SITE_OTHER): Payer: Self-pay | Admitting: Ophthalmology

## 2024-01-08 DIAGNOSIS — H04123 Dry eye syndrome of bilateral lacrimal glands: Secondary | ICD-10-CM | POA: Diagnosis not present

## 2024-01-08 DIAGNOSIS — H35033 Hypertensive retinopathy, bilateral: Secondary | ICD-10-CM

## 2024-01-08 DIAGNOSIS — Z961 Presence of intraocular lens: Secondary | ICD-10-CM | POA: Diagnosis not present

## 2024-01-08 DIAGNOSIS — H33333 Multiple defects of retina without detachment, bilateral: Secondary | ICD-10-CM

## 2024-01-08 DIAGNOSIS — I10 Essential (primary) hypertension: Secondary | ICD-10-CM | POA: Diagnosis not present

## 2024-01-09 ENCOUNTER — Encounter (INDEPENDENT_AMBULATORY_CARE_PROVIDER_SITE_OTHER): Payer: Self-pay | Admitting: Ophthalmology

## 2024-01-15 ENCOUNTER — Other Ambulatory Visit: Payer: Self-pay

## 2024-01-15 DIAGNOSIS — M25551 Pain in right hip: Secondary | ICD-10-CM | POA: Diagnosis not present

## 2024-01-15 DIAGNOSIS — Z791 Long term (current) use of non-steroidal anti-inflammatories (NSAID): Secondary | ICD-10-CM | POA: Diagnosis not present

## 2024-01-15 DIAGNOSIS — M7061 Trochanteric bursitis, right hip: Secondary | ICD-10-CM | POA: Diagnosis not present

## 2024-01-15 DIAGNOSIS — M1611 Unilateral primary osteoarthritis, right hip: Secondary | ICD-10-CM | POA: Diagnosis not present

## 2024-01-15 DIAGNOSIS — N1831 Chronic kidney disease, stage 3a: Secondary | ICD-10-CM | POA: Diagnosis not present

## 2024-01-18 DIAGNOSIS — N1831 Chronic kidney disease, stage 3a: Secondary | ICD-10-CM | POA: Diagnosis not present

## 2024-01-18 DIAGNOSIS — Z791 Long term (current) use of non-steroidal anti-inflammatories (NSAID): Secondary | ICD-10-CM | POA: Diagnosis not present

## 2024-01-20 DIAGNOSIS — M545 Low back pain, unspecified: Secondary | ICD-10-CM | POA: Diagnosis not present

## 2024-01-20 DIAGNOSIS — M7061 Trochanteric bursitis, right hip: Secondary | ICD-10-CM | POA: Diagnosis not present

## 2024-01-25 DIAGNOSIS — M5416 Radiculopathy, lumbar region: Secondary | ICD-10-CM | POA: Diagnosis not present

## 2024-02-01 DIAGNOSIS — M5416 Radiculopathy, lumbar region: Secondary | ICD-10-CM | POA: Diagnosis not present

## 2024-02-02 DIAGNOSIS — N1831 Chronic kidney disease, stage 3a: Secondary | ICD-10-CM | POA: Diagnosis not present

## 2024-02-02 DIAGNOSIS — J4550 Severe persistent asthma, uncomplicated: Secondary | ICD-10-CM | POA: Diagnosis not present

## 2024-02-02 DIAGNOSIS — M8588 Other specified disorders of bone density and structure, other site: Secondary | ICD-10-CM | POA: Diagnosis not present

## 2024-02-02 DIAGNOSIS — J449 Chronic obstructive pulmonary disease, unspecified: Secondary | ICD-10-CM | POA: Diagnosis not present

## 2024-02-02 DIAGNOSIS — I700 Atherosclerosis of aorta: Secondary | ICD-10-CM | POA: Diagnosis not present

## 2024-02-02 DIAGNOSIS — Z0000 Encounter for general adult medical examination without abnormal findings: Secondary | ICD-10-CM | POA: Diagnosis not present

## 2024-02-02 DIAGNOSIS — I129 Hypertensive chronic kidney disease with stage 1 through stage 4 chronic kidney disease, or unspecified chronic kidney disease: Secondary | ICD-10-CM | POA: Diagnosis not present

## 2024-02-02 DIAGNOSIS — J439 Emphysema, unspecified: Secondary | ICD-10-CM | POA: Diagnosis not present

## 2024-02-02 DIAGNOSIS — K219 Gastro-esophageal reflux disease without esophagitis: Secondary | ICD-10-CM | POA: Diagnosis not present

## 2024-02-03 DIAGNOSIS — M5416 Radiculopathy, lumbar region: Secondary | ICD-10-CM | POA: Diagnosis not present

## 2024-02-08 DIAGNOSIS — M5416 Radiculopathy, lumbar region: Secondary | ICD-10-CM | POA: Diagnosis not present

## 2024-02-10 DIAGNOSIS — M5416 Radiculopathy, lumbar region: Secondary | ICD-10-CM | POA: Diagnosis not present

## 2024-02-15 DIAGNOSIS — M5416 Radiculopathy, lumbar region: Secondary | ICD-10-CM | POA: Diagnosis not present

## 2024-02-17 DIAGNOSIS — M7061 Trochanteric bursitis, right hip: Secondary | ICD-10-CM | POA: Diagnosis not present

## 2024-02-19 DIAGNOSIS — M5416 Radiculopathy, lumbar region: Secondary | ICD-10-CM | POA: Diagnosis not present

## 2024-02-22 DIAGNOSIS — M5416 Radiculopathy, lumbar region: Secondary | ICD-10-CM | POA: Diagnosis not present

## 2024-02-23 DIAGNOSIS — M25551 Pain in right hip: Secondary | ICD-10-CM | POA: Diagnosis not present

## 2024-03-01 DIAGNOSIS — M5416 Radiculopathy, lumbar region: Secondary | ICD-10-CM | POA: Diagnosis not present

## 2024-03-07 DIAGNOSIS — M5441 Lumbago with sciatica, right side: Secondary | ICD-10-CM | POA: Diagnosis not present

## 2024-03-07 DIAGNOSIS — M25551 Pain in right hip: Secondary | ICD-10-CM | POA: Diagnosis not present

## 2024-03-10 ENCOUNTER — Other Ambulatory Visit: Payer: Self-pay | Admitting: Family Medicine

## 2024-03-10 DIAGNOSIS — M545 Low back pain, unspecified: Secondary | ICD-10-CM

## 2024-03-10 DIAGNOSIS — M25551 Pain in right hip: Secondary | ICD-10-CM

## 2024-03-18 ENCOUNTER — Other Ambulatory Visit: Payer: Self-pay | Admitting: Internal Medicine

## 2024-03-18 DIAGNOSIS — J4489 Other specified chronic obstructive pulmonary disease: Secondary | ICD-10-CM

## 2024-03-19 ENCOUNTER — Ambulatory Visit
Admission: RE | Admit: 2024-03-19 | Discharge: 2024-03-19 | Disposition: A | Discharge status: Home / Self Care | Source: Ambulatory Visit | Attending: Family Medicine | Admitting: Family Medicine

## 2024-03-19 DIAGNOSIS — M25551 Pain in right hip: Secondary | ICD-10-CM | POA: Diagnosis not present

## 2024-03-19 DIAGNOSIS — M1611 Unilateral primary osteoarthritis, right hip: Secondary | ICD-10-CM | POA: Diagnosis not present

## 2024-03-19 DIAGNOSIS — M545 Low back pain, unspecified: Secondary | ICD-10-CM

## 2024-03-24 ENCOUNTER — Other Ambulatory Visit: Payer: Self-pay | Admitting: Family Medicine

## 2024-03-24 DIAGNOSIS — M545 Low back pain, unspecified: Secondary | ICD-10-CM

## 2024-03-28 DIAGNOSIS — M1611 Unilateral primary osteoarthritis, right hip: Secondary | ICD-10-CM | POA: Diagnosis not present

## 2024-04-06 NOTE — Progress Notes (Signed)
 [facility] - Clinic Note  04/08/2024   CHIEF COMPLAINT Patient presents for Retina Follow Up  HISTORY OF PRESENT ILLNESS: Barbara [name] is a 73 y.o. female who presents to the clinic today for:  HPI     Retina Follow Up   Patient presents with  Other.  In both eyes.  This started 3 months ago.  I, the attending physician,  performed the HPI with the patient and updated documentation appropriately.        Comments   Patient here for 3 months retina follow up for retinal break OU. Patient states vision is ok. OS still sees a little floaters after procedures like ants. No eye pain. Uses Restasis BID OU.       Last edited by Ronelle Coffee, MD on 04/10/2024  4:02 PM.    Pt states vision is stable, no new problems noted, she is seeing a few floaters in the left eye, she is using Restasis BID OU and Lumify every morning OU  Referring physician: Fraz[place], [place], OD [address] [place],  [place] [address]  HISTORICAL INFORMATION:  Selected notes from the MEDICAL RECORD NUMBER Referred by Dr. Micael Adas for retinal tear OD LEE:  Ocular Hx- PMH-   CURRENT MEDICATIONS: Current Outpatient Medications (Ophthalmic Drugs)  Medication Sig   RESTASIS 0.05 % ophthalmic emulsion 1 drop 2 (two) times daily.   No current facility-administered medications for this visit. (Ophthalmic Drugs)   Current Outpatient Medications (Other)  Medication Sig   acetaminophen  (TYLENOL ) 500 MG tablet Take 1,000 mg by mouth in the morning and at bedtime.   albuterol  (PROVENTIL  HFA;VENTOLIN  HFA) 108 (90 BASE) MCG/ACT inhaler Inhale 2 puffs into the lungs every 6 (six) hours as needed. For shortness of breath   albuterol  (PROVENTIL ) (2.5 MG/3ML) 0.083% nebulizer solution Take 3 mLs (2.5 mg total) by nebulization every 6 (six) hours as needed. Breathing   amLODipine  (NORVASC ) 10 MG tablet Take 10 mg by mouth daily.   atorvastatin (LIPITOR) 10 MG tablet Take 10 mg by mouth daily.    azelastine  (ASTELIN ) 0.1 % nasal spray Place 1 spray into both nostrils 2 (two) times daily. Use in each nostril as directed   azelastine  (ASTELIN ) 0.1 % nasal spray Place 1 spray into both nostrils 2 (two) times daily. Use in each nostril as directed   Calcium Carbonate-Vit D-Min (CALCIUM 1200 PO) Take 1 tablet by mouth daily.   Cholecalciferol (VITAMIN D3) 50 MCG (2000 UT) TABS Take by mouth.   Cod Liver Oil CAPS Take 1 capsule by mouth daily.   Cyanocobalamin (VITAMIN B 12 PO) Take 250 mcg by mouth daily.   fluticasone -salmeterol (ADVAIR ) 500-50 MCG/ACT AEPB INHALE 1 PUFF INTO THE LUNGS IN THE MORNING AND AT BEDTIME.   hydrochlorothiazide  (HYDRODIURIL ) 25 MG tablet Take 25 mg by mouth daily.   levocetirizine (XYZAL ) 5 MG tablet Take 1 tablet (5 mg total) by mouth every evening.   montelukast  (SINGULAIR ) 10 MG tablet TAKE 1 TABLET AT BEDTIME   Omega-3 Fatty Acids (FISH OIL) 1200 MG CAPS Take 1 capsule by mouth daily.   pantoprazole  (PROTONIX ) 40 MG tablet TAKE 1 TABLET (40 MG TOTAL) BY MOUTH DAILY.   Tiotropium Bromide  Monohydrate (SPIRIVA  RESPIMAT) 2.5 MCG/ACT AERS Inhale 2 each into the lungs every evening.   TURMERIC CURCUMIN PO Take 1 capsule by mouth daily.   Dupilumab  (DUPIXENT ) 300 MG/2ML SOAJ Inject 300 mg into the skin every 14 (fourteen) days.   No current  facility-administered medications for this visit. (Other)   REVIEW OF SYSTEMS: ROS   Positive for: Neurological, Genitourinary, Cardiovascular, Eyes, Respiratory Negative for: Constitutional, Gastrointestinal, Skin, Musculoskeletal, HENT, Endocrine, Psychiatric, Allergic/Imm, Heme/Lymph Last edited by Sylvan Evener, COA on 04/08/2024 12:30 PM.     ALLERGIES Allergies  Allergen Reactions   Nsaids Shortness Of Breath   Clonidine Derivatives     Coughing shortness of breath   Codeine Other (See Comments)    Reaction: hallucination    Ibuprofen     Other reaction(s): "NSAIDS" took breath away   Lisinopril Hives    Penicillins Hives   Vicodin [Hydrocodone -Acetaminophen ] Nausea And Vomiting   Procaine Rash   PAST MEDICAL HISTORY Past Medical History:  Diagnosis Date   Arthritis    right knee, neck   Asthma    Chronic kidney disease    COPD (chronic obstructive pulmonary disease) (HCC)    Hypertension    Mouth trouble    infection from tooth that was pulled approx. mid Mar. 2013   Past Surgical History:  Procedure Laterality Date   ABDOMINAL HYSTERECTOMY     SHOULDER SURGERY  in age 8's   cyst removed from [place] socket of left shoulder   TONSILLECTOMY  age 73   TUBAL LIGATION     FAMILY HISTORY Family History  Problem Relation Age of Onset   Dementia Mother    Kidney disease Mother    Glaucoma Mother    Cataracts Mother    Asthma Sister    Mental illness Brother    Glaucoma Paternal Aunt    Blindness Paternal Aunt    Breast cancer Neg Hx    SOCIAL HISTORY Social History   Tobacco Use   Smoking status: Former    Current packs/day: 0.00    Average packs/day: 1 pack/day for 45.0 years (45.0 ttl pk-yrs)    Types: Cigarettes    Start date: 10/26/1966    Quit date: 10/27/2011    Years since quitting: 12.4   Smokeless tobacco: Never  Vaping Use   Vaping status: Never Used  Substance Use Topics   Alcohol use: No   Drug use: No       OPHTHALMIC EXAM:  Base Eye Exam     Visual Acuity (Snellen - Linear)       Right Left   Dist [place] 20/20 20/25 +2    Correction: Glasses         Tonometry (Tonopen, 12:27 PM)       Right Left   Pressure 14 15         Pupils       Dark Light Shape React APD   Right 3 2 Round Brisk None   Left 3 2 Round Brisk None         Visual Fields (Counting fingers)       Left Right    Full Full         Extraocular Movement       Right Left    Full, Ortho Full, Ortho         Neuro/Psych     Oriented x3: Yes   Mood/Affect: Normal         Dilation     Both eyes: 1.0% Mydriacyl, 2.5% Phenylephrine @ 12:27 PM            Slit Lamp and Fundus Exam     External Exam       Right Left   External Normal Normal  Slit Lamp Exam       Right Left   Lids/Lashes Dermatochalasis - upper lid Dermatochalasis - upper lid   Conjunctiva/Sclera Melanosis Melanosis   Cornea Arcus, trace Punctate epithelial erosions, tear film debris, well healed cataract wound Arcus, 1-2+ Punctate epithelial erosions, tear film debris, well healed cataract wound   Anterior Chamber Deep and clear Deep and clear   Iris Round and dilated Round and dilated   Lens PC IOL in good postition, trace Posterior capsular opacification PC IOL in good postition   Anterior Vitreous Vitreous syneresis, Posterior vitreous detachment Vitreous syneresis         Fundus Exam       Right Left   Disc Trace pallor, sharp rim Trace pallor, sharp rim   C/D Ratio 0.3 0.3   Macula Flat, Blunted foveal reflex, RPE mottling, No heme or edema Flat, Good foveal reflex, No heme or edema   Vessels attenuated, mild tortuosity attenuated, mild tortuosity   Periphery Attached, small HST with pigmented demarcation line surrounding at 1000, retinal holes w/ pigment surrounding from 0830-0900; no SRF, good laser changes surrounding all lesions, no new RT/RD Attached; atrophic hole at 0130 with pigment surrounding, HST at 0330 with pigmented demarcation line, HST 0600 with pigmented demarcation line -- good laser surrounding all lesions, no new RT/RD           Refraction     Wearing Rx       Sphere Cylinder Axis   Right -0.75 +1.25 037   Left -1.75 +0.75 126           IMAGING AND PROCEDURES  Imaging and Procedures for 04/08/2024  OCT, Retina - OU - Both Eyes       Right Eye Quality was good. Central Foveal Thickness: 272. Progression has been stable. Findings include normal foveal contour, no IRF, no SRF.   Left Eye Quality was good. Central Foveal Thickness: 246. Progression has been stable. Findings include normal foveal contour,  no IRF, no SRF.   Notes *Images captured and stored on drive  Diagnosis / Impression:  NFP; no IRF/SRF OU  Clinical management:  See below  Abbreviations: NFP - Normal foveal profile. CME - cystoid macular edema. PED - pigment epithelial detachment. IRF - intraretinal fluid. SRF - subretinal fluid. EZ - ellipsoid zone. ERM - epiretinal membrane. ORA - outer retinal atrophy. ORT - outer retinal tubulation. SRHM - subretinal hyper-reflective material. IRHM - intraretinal hyper-reflective material           ASSESSMENT/PLAN:   ICD-10-CM   1. 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 Essential hypertension  I10     3. Hypertensive retinopathy of both eyes  H35.033 OCT, Retina - OU - Both Eyes    4. Pseudophakia, both eyes  Z96.1     5. Bilateral dry eyes  H04.123      1. Retinal breaks OU  - pt with multiple, chronic appearing retinal breaks OU OD: small HST with pigmented demarcation line surrounding at 1000, retinal holes w/ pigment surrounding  from 0830-0900; no SRF OS: atrophic hole 0130 with pigmented surrounding, HST at 0330 with pigmented demarcation line, HST 0600 with pigmented demarcation line - s/p laser retinopexy OD (12.19.24) -- good laser changes in place - s/p laser retinopexy OS (01.10.25) -- good laser changes in place - no new RT/RD - f/u in 1 year - DFE, OCT  2,3. Hypertensive retinopathy  OU - discussed importance of tight BP control - monitor   4. Pseudophakia OU  - s/p CE/IOL OU w/ Dr. Lasandra Points  - IOL in good position, doing well  - monitor  5.  Dry eyes OU - recommend artificial tears and lubricating ointment as needed  - currently using Restasis  Ophthalmic Meds Ordered this visit:  No orders of the defined types were placed in this encounter.    Return in about 1 year (around 04/08/2025) for f/u retinal breaks OU, DFE, OCT.  There are no Patient Instructions on file for this visit. This  document serves as a record of services personally performed by Jeanice Millard, MD, PhD. It was created on their behalf by Eller Gut COT, an ophthalmic technician. The creation of this record is the provider's dictation and/or activities during the visit.    Electronically signed by: Eller Gut COT 04.30.25 4:04 PM  This document serves as a record of services personally performed by Jeanice Millard, MD, PhD. It was created on their behalf by Morley Arabia. Bevin Bucks, OA an ophthalmic technician. The creation of this record is the provider's dictation and/or activities during the visit.    Electronically signed by: Morley Arabia. Bevin Bucks, OA 04/10/24 4:04 PM  Jeanice Millard, M.D., Ph.D. Diseases & Surgery of the Retina and Vitreous [facility] 04/08/2024   I have reviewed the above documentation for accuracy and completeness, and I agree with the above. Jeanice Millard, M.D., Ph.D. 04/10/24 4:07 PM    Abbreviations: M myopia (nearsighted); A astigmatism; H hyperopia (farsighted); P presbyopia; Mrx spectacle prescription;  CTL contact lenses; OD right eye; OS left eye; OU both eyes  XT exotropia; ET esotropia; PEK punctate epithelial keratitis; PEE punctate epithelial erosions; DES dry eye syndrome; MGD meibomian gland dysfunction; ATs artificial tears; PFAT's preservative free artificial tears; NSC nuclear sclerotic cataract; PSC posterior subcapsular cataract; ERM epi-retinal membrane; PVD posterior vitreous detachment; RD retinal detachment; DM diabetes mellitus; DR diabetic retinopathy; NPDR non-proliferative diabetic retinopathy; PDR proliferative diabetic retinopathy; CSME clinically significant macular edema; DME diabetic macular edema; dbh dot blot hemorrhages; CWS cotton wool spot; POAG primary open angle glaucoma; C/D cup-to-disc ratio; HVF humphrey visual field; GVF goldmann visual field; OCT optical coherence tomography; IOP intraocular pressure; BRVO Branch  retinal vein occlusion; CRVO central retinal vein occlusion; CRAO central retinal artery occlusion; BRAO branch retinal artery occlusion; RT retinal tear; SB scleral buckle; PPV pars plana vitrectomy; VH Vitreous hemorrhage; PRP panretinal laser photocoagulation; IVK intravitreal kenalog; VMT vitreomacular traction; MH Macular hole;  NVD neovascularization of the disc; NVE neovascularization elsewhere; AREDS age related eye disease study; ARMD age related macular degeneration; POAG primary open angle glaucoma; EBMD epithelial/anterior basement membrane dystrophy; ACIOL anterior chamber intraocular lens; IOL intraocular lens; PCIOL posterior chamber intraocular lens; Phaco/IOL phacoemulsification with intraocular lens placement; PRK photorefractive keratectomy; LASIK laser assisted in situ keratomileusis; HTN hypertension; DM diabetes mellitus; COPD chronic obstructive pulmonary disease

## 2024-04-08 ENCOUNTER — Encounter (INDEPENDENT_AMBULATORY_CARE_PROVIDER_SITE_OTHER): Payer: Self-pay | Admitting: Ophthalmology

## 2024-04-08 ENCOUNTER — Ambulatory Visit (INDEPENDENT_AMBULATORY_CARE_PROVIDER_SITE_OTHER): Payer: Medicare HMO | Admitting: Ophthalmology

## 2024-04-08 DIAGNOSIS — H33333 Multiple defects of retina without detachment, bilateral: Secondary | ICD-10-CM | POA: Diagnosis not present

## 2024-04-08 DIAGNOSIS — H35033 Hypertensive retinopathy, bilateral: Secondary | ICD-10-CM | POA: Diagnosis not present

## 2024-04-08 DIAGNOSIS — Z961 Presence of intraocular lens: Secondary | ICD-10-CM | POA: Diagnosis not present

## 2024-04-08 DIAGNOSIS — I10 Essential (primary) hypertension: Secondary | ICD-10-CM | POA: Diagnosis not present

## 2024-04-08 DIAGNOSIS — H04123 Dry eye syndrome of bilateral lacrimal glands: Secondary | ICD-10-CM | POA: Diagnosis not present

## 2024-04-11 ENCOUNTER — Ambulatory Visit
Admission: RE | Admit: 2024-04-11 | Discharge: 2024-04-11 | Disposition: A | Discharge status: Home / Self Care | Source: Ambulatory Visit | Attending: Family Medicine | Admitting: Family Medicine

## 2024-04-11 DIAGNOSIS — M48061 Spinal stenosis, lumbar region without neurogenic claudication: Secondary | ICD-10-CM | POA: Diagnosis not present

## 2024-04-11 DIAGNOSIS — M47816 Spondylosis without myelopathy or radiculopathy, lumbar region: Secondary | ICD-10-CM | POA: Diagnosis not present

## 2024-04-11 DIAGNOSIS — M545 Low back pain, unspecified: Secondary | ICD-10-CM

## 2024-04-20 DIAGNOSIS — M1611 Unilateral primary osteoarthritis, right hip: Secondary | ICD-10-CM | POA: Diagnosis not present

## 2024-05-12 DIAGNOSIS — I129 Hypertensive chronic kidney disease with stage 1 through stage 4 chronic kidney disease, or unspecified chronic kidney disease: Secondary | ICD-10-CM | POA: Diagnosis not present

## 2024-05-12 DIAGNOSIS — N1831 Chronic kidney disease, stage 3a: Secondary | ICD-10-CM | POA: Diagnosis not present

## 2024-05-12 DIAGNOSIS — J439 Emphysema, unspecified: Secondary | ICD-10-CM | POA: Diagnosis not present

## 2024-05-12 DIAGNOSIS — J449 Chronic obstructive pulmonary disease, unspecified: Secondary | ICD-10-CM | POA: Diagnosis not present

## 2024-05-20 DIAGNOSIS — M1611 Unilateral primary osteoarthritis, right hip: Secondary | ICD-10-CM | POA: Diagnosis not present

## 2024-05-30 DIAGNOSIS — H33311 Horseshoe tear of retina without detachment, right eye: Secondary | ICD-10-CM | POA: Diagnosis not present

## 2024-05-30 DIAGNOSIS — H40013 Open angle with borderline findings, low risk, bilateral: Secondary | ICD-10-CM | POA: Diagnosis not present

## 2024-06-06 DIAGNOSIS — J449 Chronic obstructive pulmonary disease, unspecified: Secondary | ICD-10-CM | POA: Diagnosis not present

## 2024-06-06 DIAGNOSIS — J4520 Mild intermittent asthma, uncomplicated: Secondary | ICD-10-CM | POA: Diagnosis not present

## 2024-06-06 DIAGNOSIS — J439 Emphysema, unspecified: Secondary | ICD-10-CM | POA: Diagnosis not present

## 2024-06-06 DIAGNOSIS — N1831 Chronic kidney disease, stage 3a: Secondary | ICD-10-CM | POA: Diagnosis not present

## 2024-06-08 DIAGNOSIS — M1611 Unilateral primary osteoarthritis, right hip: Secondary | ICD-10-CM | POA: Diagnosis not present

## 2024-06-13 DIAGNOSIS — J439 Emphysema, unspecified: Secondary | ICD-10-CM | POA: Diagnosis not present

## 2024-06-13 DIAGNOSIS — N1831 Chronic kidney disease, stage 3a: Secondary | ICD-10-CM | POA: Diagnosis not present

## 2024-06-13 DIAGNOSIS — J449 Chronic obstructive pulmonary disease, unspecified: Secondary | ICD-10-CM | POA: Diagnosis not present

## 2024-06-13 DIAGNOSIS — I129 Hypertensive chronic kidney disease with stage 1 through stage 4 chronic kidney disease, or unspecified chronic kidney disease: Secondary | ICD-10-CM | POA: Diagnosis not present

## 2024-06-17 ENCOUNTER — Other Ambulatory Visit: Payer: Self-pay | Admitting: Pharmacist

## 2024-06-17 DIAGNOSIS — J4489 Other specified chronic obstructive pulmonary disease: Secondary | ICD-10-CM

## 2024-06-17 MED ORDER — DUPIXENT 300 MG/2ML ~~LOC~~ SOAJ: 300.0000 mg | SUBCUTANEOUS | 0 refills | Status: DC

## 2024-06-17 NOTE — Telephone Encounter (Signed)
 Refill sent for DUPIXENT  to [facility]: [phone]  Dose: 300mg  subcut every 14 days  Last OV: 11/20/224 Provider: Dr. Meade  Next OV: overdue  Routing to scheduling team for follow-up on appt scheduling  Sherry Pennant, PharmD, MPH, BCPS Clinical Pharmacist (Rheumatology and Pulmonology)

## 2024-06-22 ENCOUNTER — Ambulatory Visit: Admitting: Internal Medicine

## 2024-06-22 ENCOUNTER — Encounter: Payer: Self-pay | Admitting: Internal Medicine

## 2024-06-22 VITALS — BP 142/68 | HR 62 | Temp 98.5°F | Ht 63.0 in | Wt 176.8 lb

## 2024-06-22 DIAGNOSIS — J4489 Other specified chronic obstructive pulmonary disease: Secondary | ICD-10-CM | POA: Diagnosis not present

## 2024-06-22 DIAGNOSIS — D721 Eosinophilia, unspecified: Secondary | ICD-10-CM

## 2024-06-22 DIAGNOSIS — J3089 Other allergic rhinitis: Secondary | ICD-10-CM

## 2024-06-22 DIAGNOSIS — R768 Other specified abnormal immunological findings in serum: Secondary | ICD-10-CM

## 2024-06-22 DIAGNOSIS — K219 Gastro-esophageal reflux disease without esophagitis: Secondary | ICD-10-CM | POA: Diagnosis not present

## 2024-06-22 DIAGNOSIS — Z01811 Encounter for preprocedural respiratory examination: Secondary | ICD-10-CM

## 2024-06-22 DIAGNOSIS — J4550 Severe persistent asthma, uncomplicated: Secondary | ICD-10-CM

## 2024-06-22 DIAGNOSIS — Z87891 Personal history of nicotine dependence: Secondary | ICD-10-CM

## 2024-06-22 NOTE — Progress Notes (Signed)
 Analyssa M [name]    [id number]    February 17, 1951  Primary Care Physician:Sun, Vyvyan, MD Date of Appointment: 06/22/2024 Established Patient Visit  Chief complaint:   Chief Complaint  Patient presents with   Asthma   COPD    Doing well.  Some SOB when outside in hot weather.     HPI: Barbara [name] is a 73 y.o. woman with Asthma COPD overlap syndrome on dupixent  since February 2024. S/p Pulmonary rehab. 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   Interval Updates: Here for asthma copd overlap follow up.   Allergies are controlled, improvement with nasal sprays,.   Current Regimen: advair  500 1 puff BID, spiriva , prn albuterol , dupixent .  Asthma Triggers: allergies, being outside, exertion, cleaning solutions, heat Exacerbations in the last year: none requiring prednisone  since starting dupixent  History of hospitalization or intubation:none Allergy Testing: never had.  GERD: yes on PPI once daily Allergic Rhinitis: azelastine  and montelukast , xyzal  ACT:  Asthma Control Test ACT Total Score  06/22/2024  2:33 PM 23  12/02/2022  1:36 PM 21  04/08/2022  1:31 PM 20   FeNO: 137 ppb ---->24 ppb 09/18/22  I have reviewed the patient's family social and past medical history and updated as appropriate.   Past Medical History:  Diagnosis Date   Arthritis    right knee, neck   Asthma    Chronic kidney disease    COPD (chronic obstructive pulmonary disease) (HCC)    Hypertension    Mouth trouble    infection from tooth that was pulled approx. mid Mar. 2013    Past Surgical History:  Procedure Laterality Date   ABDOMINAL HYSTERECTOMY     SHOULDER SURGERY  in age 15's   cyst removed from [place] socket of left shoulder   TONSILLECTOMY  age 82   TUBAL LIGATION      Family History  Problem Relation Age of Onset   Dementia Mother    Kidney disease Mother    Glaucoma Mother    Cataracts Mother    Asthma Sister    Mental illness Brother     Glaucoma Paternal Aunt    Blindness Paternal Aunt    Breast cancer Neg Hx     Social History   Occupational History   Not on file  Tobacco Use   Smoking status: Former    Current packs/day: 0.00    Average packs/day: 1 pack/day for 45.0 years (45.0 ttl pk-yrs)    Types: Cigarettes    Start date: 10/26/1966    Quit date: 10/27/2011    Years since quitting: 12.6   Smokeless tobacco: Never  Vaping Use   Vaping status: Never Used  Substance and Sexual Activity   Alcohol use: No   Drug use: No   Sexual activity: Never     Physical Exam: Blood pressure (!) 142/68, pulse 62, temperature 98.5 F (36.9 C), temperature source Oral, height 5' 3 (1.6 m), weight 176 lb 12.8 oz (80.2 kg), SpO2 97%.  Gen:      No distress,ambulating with cane ENT: no thrush Lungs:   ctab no wheezes CV:        RRR no mrg   Data Reviewed: Imaging: I have personally reviewed the CT lung cancer screening October 2024 - emphysema, no concerning nodules or masses.   PFTs:      Latest Ref Rng & Units 04/17/2021    2:55 PM  PFT Results  FVC-Pre L 1.58   FVC-Predicted Pre % 68   FVC-Post L 1.72   FVC-Predicted Post % 74   Pre FEV1/FVC % % 63   Post FEV1/FCV % % 66   FEV1-Pre L 0.99   FEV1-Predicted Pre % 55   FEV1-Post L 1.13    I have personally reviewed the patient's PFTs and moderately severe airflow limitation  Labs: IgE elevated 1,061 Sensitive to dust mites, cats, dogs, grass, cockroach, mouse  Immunization status: Immunization History  Administered Date(s) Administered   Fluzone Influenza virus vaccine,trivalent (IIV3), split virus 10/06/2013, 09/04/2017, 09/03/2018   Hepatitis A, Adult 03/12/2016, 09/08/2016   Influenza Split 10/22/2011, 09/24/2012, 09/22/2013, 09/07/2014, 09/08/2015   Influenza, High Dose Seasonal PF 08/08/2021   Influenza-Unspecified 09/19/2022   PFIZER(Purple Top)SARS-COV-2 Vaccination 01/02/2020, 01/23/2020, 03/20/2021, 08/16/2021   Pneumococcal  Conjugate-13 07/02/2015   Pneumococcal Polysaccharide-23 10/22/2011, 01/04/2019   Tdap 01/02/2016   Zoster, Live 01/02/2016, 10/21/2017, 12/22/2017    Assessment:  Allergic Asthma COPD Overlap Syndrome, with improvement of symptoms on dupixent .  Peripheral eosinophilia  History of tobacco use disorder, in remission.  GERD controlled Allergic Rhinitis not well controlled Elevated FeNO Preoperative pulmonary evaluation for hip arthroplasty  Plan/Recommendations: Glad you are doing well!  Continue acid reflux medication, advair  and albuterol  as needed. You can stop spiriva .  Continue the dupixent  injections Continue the acid reflux medication For the allergies continue astelin  nasal spray, xyzal , and get back on the singulair . Continue lung cancer screening October 2025   This is a preoperative pulmonary evaluation for Barbara [name] for hip arthroplasty.  ASSESSMENT: Barbara [name] has a low risk of post-operative pulmonary complications by ARISCAT Index.  The absolute assessment of risk/benefit of the procedure is deferred to the primary team's evaluation.  RECOMMENDATIONS:  In order to minimize the risk of complications and optimize pulmonary status, we recommend the following: - Encourage aggressive incentive spirometry hourly both peri-operatively and post-operatively as tolerated  - Early ambulation and physical therapy as tolerated post-operatively - Adequate pain control especially in the setting of abdominal and thoracic surgery - Bronchodilators as needed for wheezing or shortness of breath - Ideally recommend smoking cessation for >4 weeks before any intervention - Intraoperatively keep OR time to the shortest as possible  Preoperative Risk Calculation: Postoperative respiratory failure (PRF) is considered as failure to wean from mechanical ventilation within 48 hours of surgery or unplanned intubation/reintubation postoperatively. The validated risk calculator  provides a risk estimate of PRF and is anticipated to aid in surgical decision-making and informed patient consent.  However risk can be accepted given the potential benefit of this intervention and it is not prohibitive.  ARISCAT: Mazo et al. Anesthesiology [phone]  Verdon Gore, MD Pulmonary and Critical Care Medicine [facility] 06/22/2024 2:55 PM Pager: see AMION  If no response to pager, please call critical care on call (see AMION) until 7pm After 7:00 pm call Elink     Return to Care: No follow-ups on file.   Verdon Gore, MD Pulmonary and Critical Care Medicine [facility] Office:[phone]

## 2024-06-22 NOTE — Patient Instructions (Signed)
 It was a pleasure to see you today!  Please schedule follow up with myself in 6 months.  If my schedule is not open yet, we will contact you with a reminder closer to that time. Please call [phone] if you haven't heard from us  a month before, and always call us  sooner if issues or concerns arise. You can also send us  a message through MyChart, but but aware that this is not to be used for urgent issues and it may take up to 5-7 days to receive a reply. Please be aware that you will likely be able to view your results before I have a chance to respond to them. Please give us  5 business days to respond to any non-urgent results.    Glad you are doing well!  Continue acid reflux medication, advair  and albuterol  as needed. You can stop spiriva .  Continue the dupixent  injections Continue the acid reflux medication For the allergies continue astelin  nasal spray, xyzal , and get back on the singulair .  Good luck with hip surgery!

## 2024-07-04 ENCOUNTER — Other Ambulatory Visit: Payer: Self-pay | Admitting: Orthopaedic Surgery

## 2024-07-06 NOTE — Progress Notes (Signed)
 Sent message, via epic in basket, requesting orders in epic from Careers adviser.

## 2024-07-07 DIAGNOSIS — J449 Chronic obstructive pulmonary disease, unspecified: Secondary | ICD-10-CM | POA: Diagnosis not present

## 2024-07-07 DIAGNOSIS — N1831 Chronic kidney disease, stage 3a: Secondary | ICD-10-CM | POA: Diagnosis not present

## 2024-07-07 DIAGNOSIS — J439 Emphysema, unspecified: Secondary | ICD-10-CM | POA: Diagnosis not present

## 2024-07-07 DIAGNOSIS — I129 Hypertensive chronic kidney disease with stage 1 through stage 4 chronic kidney disease, or unspecified chronic kidney disease: Secondary | ICD-10-CM | POA: Diagnosis not present

## 2024-07-07 DIAGNOSIS — J4520 Mild intermittent asthma, uncomplicated: Secondary | ICD-10-CM | POA: Diagnosis not present

## 2024-07-11 DIAGNOSIS — M1611 Unilateral primary osteoarthritis, right hip: Secondary | ICD-10-CM | POA: Diagnosis not present

## 2024-07-11 DIAGNOSIS — M25651 Stiffness of right hip, not elsewhere classified: Secondary | ICD-10-CM | POA: Diagnosis not present

## 2024-07-11 DIAGNOSIS — R262 Difficulty in walking, not elsewhere classified: Secondary | ICD-10-CM | POA: Diagnosis not present

## 2024-07-11 NOTE — Progress Notes (Signed)
 Anesthesia Review:  PCP: Cardiologist : Therese Gore- LOV 06/22/24   PPM/ ICD: Device Orders: Rep Notified:  Chest x-ray : CT Chest- 09/25/23  EKG : Echo : 09/25/22  Stress test: Cardiac Cath :   Activity level:  Sleep Study/ CPAP : Fasting Blood Sugar :      / Checks Blood Sugar -- times a day:    Blood Thinner/ Instructions /Last Dose: ASA / Instructions/ Last Dose :

## 2024-07-11 NOTE — Patient Instructions (Signed)
 SURGICAL WAITING ROOM VISITATION  Patients having surgery or a procedure may have no more than 2 support people in the waiting area - these visitors may rotate.    Children under the age of 44 must have an adult with them who is not the patient.  Visitors with respiratory illnesses are discouraged from visiting and should remain at home.  If the patient needs to stay at the hospital during part of their recovery, the visitor guidelines for inpatient rooms apply. Pre-op nurse will coordinate an appropriate time for 1 support person to accompany patient in pre-op.  This support person may not rotate.    Please refer to the [facility] website for the visitor guidelines for Inpatients (after your surgery is over and you are in a regular room).       Your procedure is scheduled on: 07/26/2024    Report to [facility] Main Entrance    Report to admitting at  0515 AM   Call this number if you have problems the morning of surgery [phone]   Do not eat food :After Midnight.   After Midnight you may have the following liquids until ___ 0430___ AM DAY OF SURGERY  Water Non-Citrus Juices (without pulp, NO RED-Apple, White grape, White cranberry) Black Coffee (NO MILK/CREAM OR CREAMERS, sugar ok)  Clear Tea (NO MILK/CREAM OR CREAMERS, sugar ok) regular and decaf                             Plain Jell-O (NO RED)                                           Fruit ices (not with fruit pulp, NO RED)                                     Popsicles (NO RED)                                                               Sports drinks like Gatorade (NO RED)                    The day of surgery:  Drink ONE (1) Pre-Surgery Clear Ensure or G2 at  0430AM the morning of surgery. Drink in one sitting. Do not sip.  This drink was given to you during your hospital  pre-op appointment visit. Nothing else to drink after completing the  Pre-Surgery Clear Ensure or G2.          If you have  questions, please contact your surgeon's office.       Oral Hygiene is also important to reduce your risk of infection.                                    Remember - BRUSH YOUR TEETH THE MORNING OF SURGERY WITH YOUR REGULAR TOOTHPASTE  DENTURES WILL BE REMOVED PRIOR TO SURGERY PLEASE DO NOT APPLY Poly grip OR ADHESIVES!!!  Do NOT smoke after Midnight   Stop all vitamins and herbal supplements 7 days before surgery.    Take these medicines the morning of surgery with A SIP OF WATER: inhalers as usual and bring, amlodipine , nebulizer if needed, nasal spray protonix    DO NOT TAKE ANY ORAL DIABETIC MEDICATIONS DAY OF YOUR SURGERY  Bring CPAP mask and tubing day of surgery.                              You may not have any metal on your body including hair pins, jewelry, and body piercing             Do not wear make-up, lotions, powders, perfumes/cologne, or deodorant  Do not wear nail polish including gel and S&S, artificial/acrylic nails, or any other type of covering on natural nails including finger and toenails. If you have artificial nails, gel coating, etc. that needs to be removed by a nail salon please have this removed prior to surgery or surgery may need to be canceled/ delayed if the surgeon/ anesthesia feels like they are unable to be safely monitored.   Do not shave  48 hours prior to surgery.               Men may shave face and neck.   Do not bring valuables to the hospital. [facility] IS NOT             RESPONSIBLE   FOR VALUABLES.   Contacts, glasses, dentures or bridgework may not be worn into surgery.   Bring small overnight bag day of surgery.   DO NOT BRING YOUR HOME MEDICATIONS TO THE HOSPITAL. PHARMACY WILL DISPENSE MEDICATIONS LISTED ON YOUR MEDICATION LIST TO YOU DURING YOUR ADMISSION IN THE HOSPITAL!    Patients discharged on the day of surgery will not be allowed to drive home.  Someone NEEDS to stay with you for the first 24 hours after  anesthesia.   Special Instructions: Bring a copy of your healthcare power of attorney and living will documents the day of surgery if you haven't scanned them before.              Please read over the following fact sheets you were given: IF YOU HAVE QUESTIONS ABOUT YOUR PRE-OP INSTRUCTIONS PLEASE CALL [phone].   If you received a COVID test during your pre-op visit  it is requested that you wear a mask when out in public, stay away from anyone that may not be feeling well and notify your surgeon if you develop symptoms. If you test positive for Covid or have been in contact with anyone that has tested positive in the last 10 days please notify you surgeon.      Pre-operative 5 CHG Bath Instructions   You can play a key role in reducing the risk of infection after surgery. Your skin needs to be as free of germs as possible. You can reduce the number of germs on your skin by washing with CHG (chlorhexidine gluconate) soap before surgery. CHG is an antiseptic soap that kills germs and continues to kill germs even after washing.   DO NOT use if you have an allergy to chlorhexidine/CHG or antibacterial soaps. If your skin becomes reddened or irritated, stop using the CHG and notify one of our RNs at [phone].   Please shower with the CHG soap starting 4 days before surgery using the following schedule:  Please keep in mind the following:  DO NOT shave, including legs and underarms, starting the day of your first shower.   You may shave your face at any point before/day of surgery.  Place clean sheets on your bed the day you start using CHG soap. Use a clean washcloth (not used since being washed) for each shower. DO NOT sleep with pets once you start using the CHG.   CHG Shower Instructions:  If you choose to wash your hair and private area, wash first with your normal shampoo/soap.  After you use shampoo/soap, rinse your hair and body thoroughly to remove shampoo/soap residue.   Turn the water OFF and apply about 3 tablespoons (45 ml) of CHG soap to a CLEAN washcloth.  Apply CHG soap ONLY FROM YOUR NECK DOWN TO YOUR TOES (washing for 3-5 minutes)  DO NOT use CHG soap on face, private areas, open wounds, or sores.  Pay special attention to the area where your surgery is being performed.  If you are having back surgery, having someone wash your back for you may be helpful. Wait 2 minutes after CHG soap is applied, then you may rinse off the CHG soap.  Pat dry with a clean towel  Put on clean clothes/pajamas   If you choose to wear lotion, please use ONLY the CHG-compatible lotions on the back of this paper.     Additional instructions for the day of surgery: DO NOT APPLY any lotions, deodorants, cologne, or perfumes.   Put on clean/comfortable clothes.  Brush your teeth.  Ask your nurse before applying any prescription medications to the skin.      CHG Compatible Lotions   Aveeno Moisturizing lotion  Cetaphil Moisturizing Cream  Cetaphil Moisturizing Lotion  Clairol Herbal Essence Moisturizing Lotion, Dry Skin  Clairol Herbal Essence Moisturizing Lotion, Extra Dry Skin  Clairol Herbal Essence Moisturizing Lotion, Normal Skin  Curel Age Defying Therapeutic Moisturizing Lotion with Alpha Hydroxy  Curel Extreme Care Body Lotion  Curel Soothing Hands Moisturizing Hand Lotion  Curel Therapeutic Moisturizing Cream, Fragrance-Free  Curel Therapeutic Moisturizing Lotion, Fragrance-Free  Curel Therapeutic Moisturizing Lotion, Original Formula  Eucerin Daily Replenishing Lotion  Eucerin Dry Skin Therapy Plus Alpha Hydroxy Crme  Eucerin Dry Skin Therapy Plus Alpha Hydroxy Lotion  Eucerin Original Crme  Eucerin Original Lotion  Eucerin Plus Crme Eucerin Plus Lotion  Eucerin TriLipid Replenishing Lotion  Keri Anti-Bacterial Hand Lotion  Keri Deep Conditioning Original Lotion Dry Skin Formula Softly Scented  Keri Deep Conditioning Original Lotion, Fragrance  Free Sensitive Skin Formula  Keri Lotion Fast Absorbing Fragrance Free Sensitive Skin Formula  Keri Lotion Fast Absorbing Softly Scented Dry Skin Formula  Keri Original Lotion  Keri Skin Renewal Lotion Keri Silky Smooth Lotion  Keri Silky Smooth Sensitive Skin Lotion  Nivea Body Creamy Conditioning Oil  Nivea Body Extra Enriched Teacher, adult education Moisturizing Lotion Nivea Crme  Nivea Skin Firming Lotion  NutraDerm 30 Skin Lotion  NutraDerm Skin Lotion  NutraDerm Therapeutic Skin Cream  NutraDerm Therapeutic Skin Lotion  ProShield Protective Hand Cream  Provon moisturizing lotion

## 2024-07-13 ENCOUNTER — Other Ambulatory Visit: Payer: Self-pay

## 2024-07-13 ENCOUNTER — Encounter (HOSPITAL_COMMUNITY)
Admission: RE | Admit: 2024-07-13 | Discharge: 2024-07-13 | Disposition: A | Discharge status: Home / Self Care | Source: Ambulatory Visit | Attending: Orthopaedic Surgery | Admitting: Orthopaedic Surgery

## 2024-07-13 ENCOUNTER — Encounter (HOSPITAL_COMMUNITY): Payer: Self-pay

## 2024-07-13 ENCOUNTER — Ambulatory Visit (HOSPITAL_COMMUNITY)
Admission: RE | Admit: 2024-07-13 | Discharge: 2024-07-13 | Disposition: A | Discharge status: Home / Self Care | Source: Ambulatory Visit | Attending: Orthopaedic Surgery | Admitting: Orthopaedic Surgery

## 2024-07-13 VITALS — BP 104/68 | HR 59 | Temp 98.7°F | Resp 16 | Ht 65.0 in | Wt 170.0 lb

## 2024-07-13 DIAGNOSIS — I129 Hypertensive chronic kidney disease with stage 1 through stage 4 chronic kidney disease, or unspecified chronic kidney disease: Secondary | ICD-10-CM | POA: Diagnosis not present

## 2024-07-13 DIAGNOSIS — I10 Essential (primary) hypertension: Secondary | ICD-10-CM | POA: Diagnosis not present

## 2024-07-13 DIAGNOSIS — J439 Emphysema, unspecified: Secondary | ICD-10-CM | POA: Diagnosis not present

## 2024-07-13 DIAGNOSIS — Z01818 Encounter for other preprocedural examination: Secondary | ICD-10-CM

## 2024-07-13 DIAGNOSIS — J449 Chronic obstructive pulmonary disease, unspecified: Secondary | ICD-10-CM | POA: Diagnosis not present

## 2024-07-13 DIAGNOSIS — N1831 Chronic kidney disease, stage 3a: Secondary | ICD-10-CM | POA: Diagnosis not present

## 2024-07-13 HISTORY — DX: Dyspnea, unspecified: R06.00

## 2024-07-13 LAB — CBC
HCT: 40 % (ref 36.0–46.0)
Hemoglobin: 12.1 g/dL (ref 12.0–15.0)
MCH: 28.7 pg (ref 26.0–34.0)
MCHC: 30.3 g/dL (ref 30.0–36.0)
MCV: 95 fL (ref 80.0–100.0)
Platelets: 137 K/uL — ABNORMAL LOW (ref 150–400)
RBC: 4.21 MIL/uL (ref 3.87–5.11)
RDW: 13.4 % (ref 11.5–15.5)
WBC: 5.2 K/uL (ref 4.0–10.5)
nRBC: 0 % (ref 0.0–0.2)

## 2024-07-13 LAB — BASIC METABOLIC PANEL WITH GFR
Anion gap: 9 (ref 5–15)
BUN: 12 mg/dL (ref 8–23)
CO2: 25 mmol/L (ref 22–32)
Calcium: 9.1 mg/dL (ref 8.9–10.3)
Chloride: 102 mmol/L (ref 98–111)
Creatinine, Ser: 1.01 mg/dL — ABNORMAL HIGH (ref 0.44–1.00)
GFR, Estimated: 58 mL/min — ABNORMAL LOW (ref >60)
Glucose, Bld: 112 mg/dL — ABNORMAL HIGH (ref 70–99)
Potassium: 4.2 mmol/L (ref 3.5–5.1)
Sodium: 136 mmol/L (ref 135–145)

## 2024-07-13 LAB — SURGICAL PCR SCREEN
MRSA, PCR: NEGATIVE
Staphylococcus aureus: NEGATIVE

## 2024-07-13 LAB — TYPE AND SCREEN
ABO/RH(D): O POS
Antibody Screen: NEGATIVE

## 2024-07-14 NOTE — Progress Notes (Signed)
 Anesthesia Chart Review   Case: [id number] Date/Time: 07/26/24 0715   Procedure: ARTHROPLASTY, HIP, TOTAL, ANTERIOR APPROACH (Right: Hip)   Anesthesia type: Spinal   Pre-op diagnosis: RIGHT HIP DEGENERATIVE JOINT DISEASE   Location: [facility] ROOM 06 / [facility]   Surgeons: Sheril Coy, MD       DISCUSSION:73 y.o. former smoker with h/o HTN, COPD, right hip djd scheduled for above procedure 07/26/2024 with Dr. Coy Sheril.   Pt last seen by pulmonology 06/22/2024. Per OV note, Postoperative respiratory failure (PRF) is considered as failure to wean from mechanical ventilation within 48 hours of surgery or unplanned intubation/reintubation postoperatively. The validated risk calculator provides a risk estimate of PRF and is anticipated to aid in surgical decision-making and informed patient consent.  However risk can be accepted given the potential benefit of this intervention and it is not prohibitive.  VS: BP 104/68   Pulse (!) 59   Temp 37.1 C (Oral)   Resp 16   Ht 5' 5 (1.651 m)   Wt 77.1 kg   SpO2 99%   BMI 28.29 kg/m   PROVIDERS: Sun, Vyvyan, MD is PCP    LABS: Labs reviewed: Acceptable for surgery. (all labs ordered are listed, but only abnormal results are displayed)  Labs Reviewed  BASIC METABOLIC PANEL WITH GFR - Abnormal; Notable for the following components:      Result Value   Glucose, Bld 112 (*)    Creatinine, Ser 1.01 (*)    GFR, Estimated 58 (*)    All other components within normal limits  CBC - Abnormal; Notable for the following components:   Platelets 137 (*)    All other components within normal limits  SURGICAL PCR SCREEN  TYPE AND SCREEN     IMAGES:   EKG:   CV:  Past Medical History:  Diagnosis Date   Arthritis    right knee, neck   Asthma    Chronic kidney disease    COPD (chronic obstructive pulmonary disease) (HCC)    Dyspnea    Hypertension    Mouth trouble    infection from tooth that was pulled approx. mid Mar. 2013     Past Surgical History:  Procedure Laterality Date   ABDOMINAL HYSTERECTOMY     SHOULDER SURGERY  in age 51's   cyst removed from [place] socket of left shoulder   TONSILLECTOMY  age 86   TUBAL LIGATION      MEDICATIONS:  acetaminophen  (TYLENOL ) 500 MG tablet   albuterol  (PROVENTIL  HFA;VENTOLIN  HFA) 108 (90 BASE) MCG/ACT inhaler   albuterol  (PROVENTIL ) (2.5 MG/3ML) 0.083% nebulizer solution   amLODipine  (NORVASC ) 10 MG tablet   atorvastatin (LIPITOR) 10 MG tablet   azelastine  (ASTELIN ) 0.1 % nasal spray   Calcium Carbonate-Vit D-Min (CALCIUM 1200 PO)   carboxymethylcellulose (REFRESH PLUS) 0.5 % SOLN   Cholecalciferol (VITAMIN D3) 50 MCG (2000 UT) TABS   Cod Liver Oil CAPS   Cyanocobalamin (VITAMIN B 12 PO)   diphenhydrAMINE  (BENADRYL ) 25 MG tablet   Dupilumab  (DUPIXENT ) 300 MG/2ML SOAJ   fluticasone -salmeterol (ADVAIR ) 500-50 MCG/ACT AEPB   hydrochlorothiazide  (HYDRODIURIL ) 25 MG tablet   levocetirizine (XYZAL ) 5 MG tablet   montelukast  (SINGULAIR ) 10 MG tablet   Omega-3 Fatty Acids (FISH OIL) 1200 MG CAPS   pantoprazole  (PROTONIX ) 40 MG tablet   RESTASIS 0.05 % ophthalmic emulsion   Tiotropium Bromide  Monohydrate (SPIRIVA  RESPIMAT) 2.5 MCG/ACT AERS   traMADol  (ULTRAM ) 50 MG tablet   TURMERIC CURCUMIN PO   No current  facility-administered medications for this encounter.     Harlene Hoots Ward, PA-C [facility] [phone]

## 2024-07-20 DIAGNOSIS — R262 Difficulty in walking, not elsewhere classified: Secondary | ICD-10-CM | POA: Diagnosis not present

## 2024-07-20 DIAGNOSIS — M25651 Stiffness of right hip, not elsewhere classified: Secondary | ICD-10-CM | POA: Diagnosis not present

## 2024-07-20 DIAGNOSIS — M1611 Unilateral primary osteoarthritis, right hip: Secondary | ICD-10-CM | POA: Diagnosis not present

## 2024-07-21 NOTE — H&P (Signed)
 TOTAL HIP ADMISSION H&P  Patient is admitted for right total hip arthroplasty.  Subjective:  Chief Complaint: right hip pain  HPI: Barbara [name] Seen, 73 y.o. female, has a history of pain and functional disability in the right hip(s) due to arthritis and patient has failed non-surgical conservative treatments for greater than 12 weeks to include NSAID's and/or analgesics, corticosteriod injections, flexibility and strengthening excercises, supervised PT with diminished ADL's post treatment, use of assistive devices, weight reduction as appropriate, and activity modification.  Onset of symptoms was gradual starting 5 years ago with gradually worsening course since that time.The patient noted no past surgery on the right hip(s).  Patient currently rates pain in the right hip at 10 out of 10 with activity. Patient has night pain, worsening of pain with activity and weight bearing, trendelenberg gait, pain that interfers with activities of daily living, and crepitus. Patient has evidence of subchondral cysts, subchondral sclerosis, periarticular osteophytes, and joint space narrowing by imaging studies. This condition presents safety issues increasing the risk of falls. There is no current active infection.  Patient Active Problem List   Diagnosis Date Noted   Asthma 12/02/2022   Cataract 04/17/2021   Chronic kidney disease due to hypertension 04/17/2021   Chronic kidney disease, stage 3a (HCC) 04/17/2021   Discoid lupus erythematosus 04/17/2021   Gastroesophageal reflux disease 04/17/2021   Hepatitis C 04/17/2021   Mixed anxiety and depressive disorder 04/17/2021   Obesity due to excess calories 04/17/2021   Other specified disorders of bone density and structure, other site 04/17/2021   History of colonic polyps 04/17/2021   Primary localized osteoarthritis of pelvic region and thigh 04/17/2021   Dizziness 01/27/2020   Chest pain 09/23/2012   Cough 04/14/2012   COPD with acute exacerbation  (HCC) 10/21/2011   Nicotine  abuse 10/21/2011   Hypertension    COPD GOLD II    Past Medical History:  Diagnosis Date   Arthritis    right knee, neck   Asthma    Chronic kidney disease    COPD (chronic obstructive pulmonary disease) (HCC)    Dyspnea    Hypertension    Mouth trouble    infection from tooth that was pulled approx. mid Mar. 2013    Past Surgical History:  Procedure Laterality Date   ABDOMINAL HYSTERECTOMY     SHOULDER SURGERY  in age 42's   cyst removed from [place] socket of left shoulder   TONSILLECTOMY  age 75   TUBAL LIGATION      No current facility-administered medications for this encounter.   Current Outpatient Medications  Medication Sig Dispense Refill Last Dose/Taking   acetaminophen  (TYLENOL ) 500 MG tablet Take 1,500 mg by mouth in the morning, at noon, and at bedtime.   Taking   albuterol  (PROVENTIL  HFA;VENTOLIN  HFA) 108 (90 BASE) MCG/ACT inhaler Inhale 2 puffs into the lungs every 6 (six) hours as needed for wheezing or shortness of breath.   Taking As Needed   albuterol  (PROVENTIL ) (2.5 MG/3ML) 0.083% nebulizer solution Take 3 mLs (2.5 mg total) by nebulization every 6 (six) hours as needed. Breathing (Patient taking differently: Take 2.5 mg by nebulization every 6 (six) hours as needed for shortness of breath or wheezing.) 75 mL 3 Taking Differently   amLODipine  (NORVASC ) 10 MG tablet Take 10 mg by mouth in the morning.   Taking   atorvastatin  (LIPITOR) 10 MG tablet Take 10 mg by mouth in the morning.   Taking   azelastine  (ASTELIN ) 0.1 % nasal spray  Place 1 spray into both nostrils 2 (two) times daily. Use in each nostril as directed 30 mL 0 Taking   Calcium  Carbonate-Vit D-Min (CALCIUM  1200 PO) Take 1 tablet by mouth daily with lunch.   Taking   carboxymethylcellulose (REFRESH PLUS) 0.5 % SOLN Place 1 drop into both eyes in the morning and at bedtime.   Taking   Cholecalciferol (VITAMIN D3) 50 MCG (2000 UT) TABS Take 2,000 Units by mouth daily with  lunch.   Taking   Cod Liver Oil CAPS Take 1 capsule by mouth daily with lunch.   Taking   Cyanocobalamin (VITAMIN B 12 PO) Take 2,500 mcg by mouth daily with lunch.   Taking   diphenhydrAMINE  (BENADRYL ) 25 MG tablet Take 25 mg by mouth in the morning.   Taking   Dupilumab  (DUPIXENT ) 300 MG/2ML SOAJ Inject 300 mg into the skin every 14 (fourteen) days. **appointment needed for further refills** 12 mL 0 Taking   fluticasone -salmeterol (ADVAIR ) 500-50 MCG/ACT AEPB INHALE 1 PUFF INTO THE LUNGS IN THE MORNING AND AT BEDTIME. 180 each 3 Taking   hydrochlorothiazide  (HYDRODIURIL ) 25 MG tablet Take 25 mg by mouth in the morning.   Taking   levocetirizine (XYZAL ) 5 MG tablet Take 1 tablet (5 mg total) by mouth every evening. 30 tablet 11 Taking   montelukast  (SINGULAIR ) 10 MG tablet TAKE 1 TABLET AT BEDTIME 90 tablet 3 Taking   Omega-3 Fatty Acids (FISH OIL) 1200 MG CAPS Take 1,200 mg by mouth daily with lunch.   Taking   pantoprazole  (PROTONIX ) 40 MG tablet TAKE 1 TABLET (40 MG TOTAL) BY MOUTH DAILY. 90 tablet 3 Taking   RESTASIS  0.05 % ophthalmic emulsion Place 1 drop into both eyes 2 (two) times daily.   Taking   Tiotropium Bromide  Monohydrate (SPIRIVA  RESPIMAT) 2.5 MCG/ACT AERS Inhale 2 puffs into the lungs every evening.   Taking   traMADol  (ULTRAM ) 50 MG tablet Take 50 mg by mouth at bedtime as needed for moderate pain (pain score 4-6).   Taking As Needed   TURMERIC CURCUMIN PO Take 1 capsule by mouth daily with lunch.   Taking   Allergies  Allergen Reactions   Nsaids Shortness Of Breath   Clonidine Derivatives     Coughing shortness of breath   Codeine Other (See Comments)    Reaction: hallucination    Ibuprofen     Other reaction(s): NSAIDS took breath away   Lisinopril Hives   Penicillins Hives   Vicodin [Hydrocodone -Acetaminophen ] Nausea And Vomiting   Procaine Rash    Social History   Tobacco Use   Smoking status: Former    Current packs/day: 0.00    Average packs/day: 1  pack/day for 45.0 years (45.0 ttl pk-yrs)    Types: Cigarettes    Start date: 10/26/1966    Quit date: 10/27/2011    Years since quitting: 12.7   Smokeless tobacco: Never  Substance Use Topics   Alcohol  use: No    Family History  Problem Relation Age of Onset   Dementia Mother    Kidney disease Mother    Glaucoma Mother    Cataracts Mother    Asthma Sister    Mental illness Brother    Glaucoma Paternal Aunt    Blindness Paternal Aunt    Breast cancer Neg Hx      Review of Systems  Musculoskeletal:  Positive for arthralgias.       Right hip  All other systems reviewed and are negative.   Objective:  Physical Exam Constitutional:      Appearance: Normal appearance.  HENT:     Head: Normocephalic and atraumatic.     Nose: Nose normal.     Mouth/Throat:     Pharynx: Oropharynx is clear.  Eyes:     Extraocular Movements: Extraocular movements intact.  Pulmonary:     Effort: Pulmonary effort is normal.  Abdominal:     Palpations: Abdomen is soft.  Musculoskeletal:     Cervical back: Normal range of motion.     Comments: Right hip motion remains extremely painful with internal rotation.  Straight leg raise is negative.  Opposite hip moves well.  Sensation and motor function are intact distally.  She has normal unlabored respirations.  Sensation and motor function are intact in her feet with palpable pulses on both sides.    Skin:    General: Skin is warm and dry.  Neurological:     General: No focal deficit present.     Mental Status: She is alert and oriented to person, place, and time. Mental status is at baseline.  Psychiatric:        Mood and Affect: Mood normal.        Behavior: Behavior normal.        Thought Content: Thought content normal.        Judgment: Judgment normal.     Vital signs in last 24 hours:    Labs:   Estimated body mass index is 28.29 kg/m as calculated from the following:   Height as of 07/13/24: 5' 5 (1.651 m).   Weight as of  07/13/24: 77.1 kg.   Imaging Review Plain radiographs demonstrate severe degenerative joint disease of the right hip(s). The bone quality appears to be good for age and reported activity level.      Assessment/Plan:  End stage primary arthritis, right hip(s)  The patient history, physical examination, clinical judgement of the provider and imaging studies are consistent with end stage degenerative joint disease of the right hip(s) and total hip arthroplasty is deemed medically necessary. The treatment options including medical management, injection therapy, arthroscopy and arthroplasty were discussed at length. The risks and benefits of total hip arthroplasty were presented and reviewed. The risks due to aseptic loosening, infection, stiffness, dislocation/subluxation,  thromboembolic complications and other imponderables were discussed.  The patient acknowledged the explanation, agreed to proceed with the plan and consent was signed. Patient is being admitted for inpatient treatment for surgery, pain control, PT, OT, prophylactic antibiotics, VTE prophylaxis, progressive ambulation and ADL's and discharge planning.The patient is planning to be discharged home with home health services

## 2024-07-21 NOTE — Care Plan (Signed)
 Ortho Bundle Case Management Note  Patient Details  Name: Barbara [name] MRN: [id number] Date of Birth: 1951/06/08   spoke with patient. she will discharge to home with family to assist. has RW at home. OPPT set up with [facility] [facility]. Discharge instructions discussed and mailed. questions answered. Patient and MD in agreement with plan. Choice offered.                DME Arranged:    DME Agency:     HH Arranged:    HH Agency:     Additional Comments: Please contact me with any questions of if this plan should need to change.  Charlies Pitch,  RN,BSN,MHA,CCM  [facility] Orthopaedic Specialist  [phone] 07/21/2024, 10:02 AM

## 2024-07-25 ENCOUNTER — Other Ambulatory Visit: Payer: Self-pay | Admitting: Internal Medicine

## 2024-07-25 DIAGNOSIS — M1611 Unilateral primary osteoarthritis, right hip: Secondary | ICD-10-CM | POA: Diagnosis not present

## 2024-07-25 DIAGNOSIS — M25651 Stiffness of right hip, not elsewhere classified: Secondary | ICD-10-CM | POA: Diagnosis not present

## 2024-07-25 DIAGNOSIS — R262 Difficulty in walking, not elsewhere classified: Secondary | ICD-10-CM | POA: Diagnosis not present

## 2024-07-25 MED ORDER — TRANEXAMIC ACID 1000 MG/10ML IV SOLN
2000.0000 mg | INTRAVENOUS | Status: AC
  Filled 2024-07-25 (×2): qty 20

## 2024-07-25 MED ORDER — PANTOPRAZOLE SODIUM 40 MG PO TBEC: 40.0000 mg | DELAYED_RELEASE_TABLET | Freq: Every day | ORAL | 3 refills | Status: AC

## 2024-07-25 NOTE — Telephone Encounter (Signed)
 Copied from CRM #[id number]. Topic: Clinical - Medication Refill >> Jul 25, 2024 12:56 PM Devaughn RAMAN wrote: Medication:  pantoprazole  (PROTONIX ) 40 MG tablet   Has the patient contacted their pharmacy? Yes (Agent: If no, request that the patient contact the pharmacy for the refill. If patient does not wish to contact the pharmacy document the reason why and proceed with request.) (Agent: If yes, when and what did the pharmacy advise?)    [facility] - [place], [place] - [address] [place] [address] Phone: [phone] Fax: [phone]   Is this the correct pharmacy for this prescription? Yes If no, delete pharmacy and type the correct one.   Has the prescription been filled recently? No  Is the patient out of the medication? No  Has the patient been seen for an appointment in the last year OR does the patient have an upcoming appointment? Yes  Can we respond through MyChart? No  Lisa with [facility] called on behalf of the patient regarding the refill request.  Agent: Please be advised that Rx refills may take up to 3 business days. We ask that you follow-up with your pharmacy.

## 2024-07-26 ENCOUNTER — Ambulatory Visit (HOSPITAL_COMMUNITY): Payer: Self-pay | Admitting: Physician Assistant

## 2024-07-26 ENCOUNTER — Encounter (HOSPITAL_COMMUNITY)
Admission: RE | Disposition: A | Discharge status: Home Health | Payer: Self-pay | Source: Home / Self Care | Attending: Orthopaedic Surgery

## 2024-07-26 ENCOUNTER — Ambulatory Visit (HOSPITAL_BASED_OUTPATIENT_CLINIC_OR_DEPARTMENT_OTHER): Admitting: Anesthesiology

## 2024-07-26 ENCOUNTER — Encounter (HOSPITAL_COMMUNITY): Payer: Self-pay | Admitting: Orthopaedic Surgery

## 2024-07-26 ENCOUNTER — Other Ambulatory Visit: Payer: Self-pay

## 2024-07-26 ENCOUNTER — Ambulatory Visit (HOSPITAL_COMMUNITY)

## 2024-07-26 ENCOUNTER — Inpatient Hospital Stay (HOSPITAL_COMMUNITY)
Admission: RE | Admit: 2024-07-26 | Discharge: 2024-07-31 | DRG: 470 | Disposition: A | Discharge status: Home / Self Care | Attending: Orthopaedic Surgery | Admitting: Orthopaedic Surgery

## 2024-07-26 DIAGNOSIS — Z96641 Presence of right artificial hip joint: Secondary | ICD-10-CM | POA: Diagnosis present

## 2024-07-26 DIAGNOSIS — J449 Chronic obstructive pulmonary disease, unspecified: Secondary | ICD-10-CM | POA: Diagnosis not present

## 2024-07-26 DIAGNOSIS — M1611 Unilateral primary osteoarthritis, right hip: Secondary | ICD-10-CM | POA: Diagnosis present

## 2024-07-26 DIAGNOSIS — J4489 Other specified chronic obstructive pulmonary disease: Secondary | ICD-10-CM | POA: Diagnosis present

## 2024-07-26 DIAGNOSIS — M25462 Effusion, left knee: Secondary | ICD-10-CM | POA: Diagnosis present

## 2024-07-26 DIAGNOSIS — L930 Discoid lupus erythematosus: Secondary | ICD-10-CM | POA: Diagnosis present

## 2024-07-26 DIAGNOSIS — Z841 Family history of disorders of kidney and ureter: Secondary | ICD-10-CM | POA: Diagnosis not present

## 2024-07-26 DIAGNOSIS — Z9181 History of falling: Secondary | ICD-10-CM

## 2024-07-26 DIAGNOSIS — F32A Depression, unspecified: Secondary | ICD-10-CM | POA: Diagnosis present

## 2024-07-26 DIAGNOSIS — M8580 Other specified disorders of bone density and structure, unspecified site: Secondary | ICD-10-CM | POA: Diagnosis present

## 2024-07-26 DIAGNOSIS — I10 Essential (primary) hypertension: Secondary | ICD-10-CM

## 2024-07-26 DIAGNOSIS — F419 Anxiety disorder, unspecified: Secondary | ICD-10-CM | POA: Diagnosis present

## 2024-07-26 DIAGNOSIS — M109 Gout, unspecified: Secondary | ICD-10-CM | POA: Diagnosis not present

## 2024-07-26 DIAGNOSIS — Z818 Family history of other mental and behavioral disorders: Secondary | ICD-10-CM

## 2024-07-26 DIAGNOSIS — Z825 Family history of asthma and other chronic lower respiratory diseases: Secondary | ICD-10-CM

## 2024-07-26 DIAGNOSIS — K219 Gastro-esophageal reflux disease without esophagitis: Secondary | ICD-10-CM | POA: Diagnosis present

## 2024-07-26 DIAGNOSIS — Z860100 Personal history of colon polyps, unspecified: Secondary | ICD-10-CM

## 2024-07-26 DIAGNOSIS — N1831 Chronic kidney disease, stage 3a: Secondary | ICD-10-CM | POA: Diagnosis present

## 2024-07-26 DIAGNOSIS — Z7951 Long term (current) use of inhaled steroids: Secondary | ICD-10-CM

## 2024-07-26 DIAGNOSIS — B1920 Unspecified viral hepatitis C without hepatic coma: Secondary | ICD-10-CM | POA: Diagnosis present

## 2024-07-26 DIAGNOSIS — Z87891 Personal history of nicotine dependence: Secondary | ICD-10-CM

## 2024-07-26 DIAGNOSIS — R110 Nausea: Secondary | ICD-10-CM | POA: Diagnosis present

## 2024-07-26 DIAGNOSIS — M25562 Pain in left knee: Secondary | ICD-10-CM | POA: Diagnosis present

## 2024-07-26 DIAGNOSIS — I129 Hypertensive chronic kidney disease with stage 1 through stage 4 chronic kidney disease, or unspecified chronic kidney disease: Secondary | ICD-10-CM | POA: Diagnosis present

## 2024-07-26 DIAGNOSIS — Z01818 Encounter for other preprocedural examination: Secondary | ICD-10-CM

## 2024-07-26 DIAGNOSIS — Z821 Family history of blindness and visual loss: Secondary | ICD-10-CM

## 2024-07-26 DIAGNOSIS — M1712 Unilateral primary osteoarthritis, left knee: Secondary | ICD-10-CM | POA: Diagnosis not present

## 2024-07-26 DIAGNOSIS — M79605 Pain in left leg: Secondary | ICD-10-CM | POA: Diagnosis not present

## 2024-07-26 DIAGNOSIS — M1610 Unilateral primary osteoarthritis, unspecified hip: Secondary | ICD-10-CM | POA: Diagnosis present

## 2024-07-26 DIAGNOSIS — Z83511 Family history of glaucoma: Secondary | ICD-10-CM

## 2024-07-26 HISTORY — PX: TOTAL HIP ARTHROPLASTY: SHX124

## 2024-07-26 LAB — ABO/RH: ABO/RH(D): O POS

## 2024-07-26 SURGERY — ARTHROPLASTY, HIP, TOTAL, ANTERIOR APPROACH
Anesthesia: General | Site: Hip | Laterality: Right

## 2024-07-26 MED ORDER — DOCUSATE SODIUM 100 MG PO CAPS
100.0000 mg | ORAL_CAPSULE | Freq: Two times a day (BID) | ORAL | Status: DC
  Administered 2024-07-26 – 2024-07-31 (×9): 100 mg via ORAL
  Filled 2024-07-26 (×10): qty 1

## 2024-07-26 MED ORDER — HYDROMORPHONE HCL 1 MG/ML IJ SOLN
0.5000 mg | INTRAMUSCULAR | Status: DC | PRN
  Administered 2024-07-27 – 2024-07-28 (×2): 1 mg via INTRAVENOUS
  Filled 2024-07-26 (×2): qty 1

## 2024-07-26 MED ORDER — FENTANYL CITRATE (PF) 100 MCG/2ML IJ SOLN
INTRAMUSCULAR | Status: AC
  Filled 2024-07-26: qty 2

## 2024-07-26 MED ORDER — POVIDONE-IODINE 10 % EX SWAB: 2.0000 | Freq: Once | CUTANEOUS | Status: DC

## 2024-07-26 MED ORDER — ATORVASTATIN CALCIUM 10 MG PO TABS
10.0000 mg | ORAL_TABLET | Freq: Every day | ORAL | Status: DC
  Administered 2024-07-27 – 2024-07-31 (×5): 10 mg via ORAL
  Filled 2024-07-26 (×5): qty 1

## 2024-07-26 MED ORDER — HYDROMORPHONE HCL 2 MG PO TABS
1.0000 mg | ORAL_TABLET | ORAL | Status: DC | PRN
  Administered 2024-07-27 – 2024-07-31 (×9): 2 mg via ORAL
  Filled 2024-07-26 (×7): qty 1

## 2024-07-26 MED ORDER — PHENOL 1.4 % MT LIQD: 1.0000 | OROMUCOSAL | Status: DC | PRN

## 2024-07-26 MED ORDER — TRANEXAMIC ACID 1000 MG/10ML IV SOLN
INTRAVENOUS | Status: DC | PRN
  Administered 2024-07-26: 2000 mg via TOPICAL

## 2024-07-26 MED ORDER — LACTATED RINGERS IV BOLUS: 250.0000 mL | Freq: Once | INTRAVENOUS | Status: DC

## 2024-07-26 MED ORDER — FLUTICASONE FUROATE-VILANTEROL 200-25 MCG/ACT IN AEPB
1.0000 | INHALATION_SPRAY | Freq: Every day | RESPIRATORY_TRACT | Status: DC
Start: 2024-07-27 — End: 2024-07-30
  Filled 2024-07-26: qty 28

## 2024-07-26 MED ORDER — METHOCARBAMOL 1000 MG/10ML IJ SOLN: 500.0000 mg | Freq: Four times a day (QID) | INTRAMUSCULAR | Status: DC | PRN

## 2024-07-26 MED ORDER — AZELASTINE HCL 0.1 % NA SOLN
1.0000 | Freq: Two times a day (BID) | NASAL | Status: DC
  Administered 2024-07-27 – 2024-07-30 (×7): 1 via NASAL
  Filled 2024-07-26: qty 30

## 2024-07-26 MED ORDER — CYCLOSPORINE 0.05 % OP EMUL
1.0000 [drp] | Freq: Two times a day (BID) | OPHTHALMIC | Status: DC
  Administered 2024-07-26 – 2024-07-31 (×10): 1 [drp] via OPHTHALMIC
  Filled 2024-07-26 (×11): qty 30

## 2024-07-26 MED ORDER — OXYCODONE HCL 5 MG PO TABS
5.0000 mg | ORAL_TABLET | Freq: Once | ORAL | Status: AC | PRN
  Administered 2024-07-26: 5 mg via ORAL

## 2024-07-26 MED ORDER — CHLORHEXIDINE GLUCONATE 0.12 % MT SOLN
15.0000 mL | Freq: Once | OROMUCOSAL | Status: AC
  Administered 2024-07-26: 15 mL via OROMUCOSAL

## 2024-07-26 MED ORDER — TRANEXAMIC ACID-NACL 1000-0.7 MG/100ML-% IV SOLN: 1000.0000 mg | Freq: Once | INTRAVENOUS | Status: DC

## 2024-07-26 MED ORDER — OXYCODONE HCL 5 MG PO TABS
ORAL_TABLET | ORAL | Status: AC
  Filled 2024-07-26: qty 1

## 2024-07-26 MED ORDER — FENTANYL CITRATE PF 50 MCG/ML IJ SOSY
PREFILLED_SYRINGE | INTRAMUSCULAR | Status: AC
  Filled 2024-07-26: qty 1

## 2024-07-26 MED ORDER — ONDANSETRON HCL 4 MG PO TABS: 4.0000 mg | ORAL_TABLET | Freq: Four times a day (QID) | ORAL | Status: DC | PRN

## 2024-07-26 MED ORDER — DIPHENHYDRAMINE HCL 12.5 MG/5ML PO ELIX: 12.5000 mg | ORAL_SOLUTION | ORAL | Status: DC | PRN

## 2024-07-26 MED ORDER — CEFAZOLIN SODIUM-DEXTROSE 2-4 GM/100ML-% IV SOLN
2.0000 g | Freq: Four times a day (QID) | INTRAVENOUS | Status: AC
  Administered 2024-07-26 – 2024-07-27 (×2): 2 g via INTRAVENOUS
  Filled 2024-07-26 (×2): qty 100

## 2024-07-26 MED ORDER — DEXAMETHASONE SODIUM PHOSPHATE 10 MG/ML IJ SOLN
INTRAMUSCULAR | Status: AC
  Filled 2024-07-26: qty 1

## 2024-07-26 MED ORDER — 0.9 % SODIUM CHLORIDE (POUR BTL) OPTIME
TOPICAL | Status: DC | PRN
  Administered 2024-07-26: 1000 mL

## 2024-07-26 MED ORDER — TIOTROPIUM BROMIDE MONOHYDRATE 2.5 MCG/ACT IN AERS: 2.0000 | INHALATION_SPRAY | Freq: Every evening | RESPIRATORY_TRACT | Status: DC

## 2024-07-26 MED ORDER — ONDANSETRON HCL 4 MG/2ML IJ SOLN: 4.0000 mg | Freq: Once | INTRAMUSCULAR | Status: DC | PRN

## 2024-07-26 MED ORDER — UMECLIDINIUM BROMIDE 62.5 MCG/ACT IN AEPB
1.0000 | INHALATION_SPRAY | Freq: Every day | RESPIRATORY_TRACT | Status: DC
  Filled 2024-07-26: qty 7

## 2024-07-26 MED ORDER — LIDOCAINE HCL (PF) 2 % IJ SOLN
INTRAMUSCULAR | Status: AC
  Filled 2024-07-26: qty 5

## 2024-07-26 MED ORDER — ASPIRIN 81 MG PO CHEW
81.0000 mg | CHEWABLE_TABLET | Freq: Two times a day (BID) | ORAL | Status: DC
  Administered 2024-07-27 – 2024-07-31 (×9): 81 mg via ORAL
  Filled 2024-07-26 (×10): qty 1

## 2024-07-26 MED ORDER — SUGAMMADEX SODIUM 200 MG/2ML IV SOLN
INTRAVENOUS | Status: DC | PRN
  Administered 2024-07-26 (×2): 100 mg via INTRAVENOUS

## 2024-07-26 MED ORDER — SUGAMMADEX SODIUM 200 MG/2ML IV SOLN
INTRAVENOUS | Status: AC
  Filled 2024-07-26: qty 2

## 2024-07-26 MED ORDER — ONDANSETRON HCL 4 MG/2ML IJ SOLN
INTRAMUSCULAR | Status: AC
  Filled 2024-07-26: qty 2

## 2024-07-26 MED ORDER — ORAL CARE MOUTH RINSE: 15.0000 mL | Freq: Once | OROMUCOSAL | Status: AC

## 2024-07-26 MED ORDER — CETIRIZINE HCL 10 MG PO TABS
10.0000 mg | ORAL_TABLET | Freq: Every evening | ORAL | Status: DC
  Administered 2024-07-27: 10 mg via ORAL
  Filled 2024-07-26 (×5): qty 1

## 2024-07-26 MED ORDER — ONDANSETRON HCL 4 MG/2ML IJ SOLN
INTRAMUSCULAR | Status: DC | PRN
  Administered 2024-07-26: 4 mg via INTRAVENOUS

## 2024-07-26 MED ORDER — BUPIVACAINE LIPOSOME 1.3 % IJ SUSP: 10.0000 mL | Freq: Once | INTRAMUSCULAR | Status: AC

## 2024-07-26 MED ORDER — ALBUTEROL SULFATE (2.5 MG/3ML) 0.083% IN NEBU
2.5000 mg | INHALATION_SOLUTION | Freq: Four times a day (QID) | RESPIRATORY_TRACT | Status: DC | PRN
Start: 2024-07-26 — End: 2024-07-31
  Administered 2024-07-28: 2.5 mg via RESPIRATORY_TRACT
  Filled 2024-07-26: qty 3

## 2024-07-26 MED ORDER — PROPOFOL 10 MG/ML IV BOLUS
INTRAVENOUS | Status: DC | PRN
  Administered 2024-07-26: 120 mg via INTRAVENOUS

## 2024-07-26 MED ORDER — CEFAZOLIN SODIUM-DEXTROSE 2-4 GM/100ML-% IV SOLN
2.0000 g | INTRAVENOUS | Status: AC
  Administered 2024-07-26: 2 g via INTRAVENOUS
  Filled 2024-07-26: qty 100

## 2024-07-26 MED ORDER — DEXAMETHASONE SODIUM PHOSPHATE 10 MG/ML IJ SOLN
INTRAMUSCULAR | Status: DC | PRN
  Administered 2024-07-26: 10 mg via INTRAVENOUS

## 2024-07-26 MED ORDER — METOCLOPRAMIDE HCL 5 MG PO TABS: 5.0000 mg | ORAL_TABLET | Freq: Three times a day (TID) | ORAL | Status: DC | PRN

## 2024-07-26 MED ORDER — BUPIVACAINE-EPINEPHRINE (PF) 0.25% -1:200000 IJ SOLN
INTRAMUSCULAR | Status: DC | PRN
  Administered 2024-07-26: 50 mL

## 2024-07-26 MED ORDER — DEXMEDETOMIDINE HCL IN NACL 80 MCG/20ML IV SOLN
INTRAVENOUS | Status: DC | PRN
  Administered 2024-07-26 (×3): 4 ug via INTRAVENOUS

## 2024-07-26 MED ORDER — BUPIVACAINE LIPOSOME 1.3 % IJ SUSP
INTRAMUSCULAR | Status: AC
  Filled 2024-07-26: qty 10

## 2024-07-26 MED ORDER — HYDROMORPHONE HCL 1 MG/ML IJ SOLN
INTRAMUSCULAR | Status: AC
  Filled 2024-07-26: qty 1

## 2024-07-26 MED ORDER — PANTOPRAZOLE SODIUM 40 MG PO TBEC
40.0000 mg | DELAYED_RELEASE_TABLET | Freq: Every day | ORAL | Status: DC
  Administered 2024-07-27 – 2024-07-31 (×5): 40 mg via ORAL
  Filled 2024-07-26 (×5): qty 1

## 2024-07-26 MED ORDER — TRANEXAMIC ACID-NACL 1000-0.7 MG/100ML-% IV SOLN
1000.0000 mg | INTRAVENOUS | Status: AC
  Administered 2024-07-26: 1000 mg via INTRAVENOUS
  Filled 2024-07-26: qty 100

## 2024-07-26 MED ORDER — ONDANSETRON HCL 4 MG/2ML IJ SOLN
4.0000 mg | Freq: Four times a day (QID) | INTRAMUSCULAR | Status: DC | PRN
  Administered 2024-07-26: 4 mg via INTRAVENOUS
  Filled 2024-07-26: qty 2

## 2024-07-26 MED ORDER — HYDROMORPHONE HCL 2 MG PO TABS
2.0000 mg | ORAL_TABLET | ORAL | Status: DC | PRN
  Administered 2024-07-26 – 2024-07-31 (×9): 2 mg via ORAL
  Filled 2024-07-26 (×11): qty 1

## 2024-07-26 MED ORDER — ACETAMINOPHEN 500 MG PO TABS
1000.0000 mg | ORAL_TABLET | Freq: Four times a day (QID) | ORAL | Status: AC
  Administered 2024-07-26 – 2024-07-27 (×3): 1000 mg via ORAL
  Filled 2024-07-26 (×4): qty 2

## 2024-07-26 MED ORDER — MENTHOL 3 MG MT LOZG
1.0000 | LOZENGE | OROMUCOSAL | Status: DC | PRN
  Administered 2024-07-28: 3 mg via ORAL
  Filled 2024-07-26: qty 9

## 2024-07-26 MED ORDER — HYDROMORPHONE HCL 1 MG/ML IJ SOLN
0.2500 mg | INTRAMUSCULAR | Status: DC | PRN
  Administered 2024-07-26 (×3): 0.5 mg via INTRAVENOUS

## 2024-07-26 MED ORDER — ROCURONIUM BROMIDE 10 MG/ML (PF) SYRINGE
PREFILLED_SYRINGE | INTRAVENOUS | Status: AC
  Filled 2024-07-26: qty 10

## 2024-07-26 MED ORDER — TIZANIDINE HCL 4 MG PO TABS: 4.0000 mg | ORAL_TABLET | Freq: Four times a day (QID) | ORAL | 1 refills | Status: AC | PRN

## 2024-07-26 MED ORDER — ACETAMINOPHEN 325 MG PO TABS
325.0000 mg | ORAL_TABLET | Freq: Four times a day (QID) | ORAL | Status: DC | PRN
  Administered 2024-07-29: 650 mg via ORAL
  Filled 2024-07-26: qty 2

## 2024-07-26 MED ORDER — FENTANYL CITRATE (PF) 100 MCG/2ML IJ SOLN
INTRAMUSCULAR | Status: DC | PRN
  Administered 2024-07-26: 100 ug via INTRAVENOUS
  Administered 2024-07-26: 50 ug via INTRAVENOUS

## 2024-07-26 MED ORDER — LACTATED RINGERS IV SOLN: INTRAVENOUS | Status: DC

## 2024-07-26 MED ORDER — METHOCARBAMOL 500 MG PO TABS
500.0000 mg | ORAL_TABLET | Freq: Four times a day (QID) | ORAL | Status: DC | PRN
  Administered 2024-07-26 – 2024-07-29 (×7): 500 mg via ORAL
  Filled 2024-07-26 (×6): qty 1

## 2024-07-26 MED ORDER — ALUM & MAG HYDROXIDE-SIMETH 200-200-20 MG/5ML PO SUSP: 30.0000 mL | ORAL | Status: DC | PRN

## 2024-07-26 MED ORDER — HYDROMORPHONE HCL 1 MG/ML IJ SOLN
INTRAMUSCULAR | Status: AC
Start: 2024-07-26 — End: 2024-07-26
  Filled 2024-07-26: qty 1

## 2024-07-26 MED ORDER — HYDROMORPHONE HCL 2 MG/ML IJ SOLN
INTRAMUSCULAR | Status: AC
  Filled 2024-07-26: qty 1

## 2024-07-26 MED ORDER — MONTELUKAST SODIUM 10 MG PO TABS
10.0000 mg | ORAL_TABLET | Freq: Every day | ORAL | Status: DC
Start: 2024-07-26 — End: 2024-07-31
  Administered 2024-07-26 – 2024-07-30 (×5): 10 mg via ORAL
  Filled 2024-07-26 (×5): qty 1

## 2024-07-26 MED ORDER — METHOCARBAMOL 500 MG PO TABS
ORAL_TABLET | ORAL | Status: AC
  Filled 2024-07-26: qty 1

## 2024-07-26 MED ORDER — BUPIVACAINE-EPINEPHRINE (PF) 0.25% -1:200000 IJ SOLN
INTRAMUSCULAR | Status: AC
  Filled 2024-07-26: qty 30

## 2024-07-26 MED ORDER — PROMETHAZINE HCL 12.5 MG PO TABS: 12.5000 mg | ORAL_TABLET | Freq: Four times a day (QID) | ORAL | 0 refills | Status: AC | PRN

## 2024-07-26 MED ORDER — POLYVINYL ALCOHOL 1.4 % OP SOLN: 1.0000 [drp] | Freq: Two times a day (BID) | OPHTHALMIC | Status: DC | PRN

## 2024-07-26 MED ORDER — METOCLOPRAMIDE HCL 5 MG/ML IJ SOLN: 5.0000 mg | Freq: Three times a day (TID) | INTRAMUSCULAR | Status: DC | PRN

## 2024-07-26 MED ORDER — ROCURONIUM BROMIDE 10 MG/ML (PF) SYRINGE
PREFILLED_SYRINGE | INTRAVENOUS | Status: DC | PRN
  Administered 2024-07-26: 50 mg via INTRAVENOUS

## 2024-07-26 MED ORDER — LIDOCAINE 2% (20 MG/ML) 5 ML SYRINGE: INTRAMUSCULAR | Status: DC | PRN

## 2024-07-26 MED ORDER — LACTATED RINGERS IV BOLUS
500.0000 mL | Freq: Once | INTRAVENOUS | Status: AC
  Administered 2024-07-26: 500 mL via INTRAVENOUS

## 2024-07-26 MED ORDER — PROPOFOL 10 MG/ML IV BOLUS
INTRAVENOUS | Status: AC
  Filled 2024-07-26: qty 20

## 2024-07-26 MED ORDER — AMLODIPINE BESYLATE 10 MG PO TABS
10.0000 mg | ORAL_TABLET | Freq: Every day | ORAL | Status: DC
  Administered 2024-07-27 – 2024-07-31 (×5): 10 mg via ORAL
  Filled 2024-07-26 (×5): qty 1

## 2024-07-26 MED ORDER — LIDOCAINE HCL (PF) 2 % IJ SOLN
INTRAMUSCULAR | Status: DC | PRN
  Administered 2024-07-26: 60 mg via INTRADERMAL

## 2024-07-26 MED ORDER — HYDROCHLOROTHIAZIDE 25 MG PO TABS
25.0000 mg | ORAL_TABLET | Freq: Every day | ORAL | Status: DC
  Administered 2024-07-27 – 2024-07-31 (×5): 25 mg via ORAL
  Filled 2024-07-26 (×5): qty 1

## 2024-07-26 MED ORDER — TRANEXAMIC ACID-NACL 1000-0.7 MG/100ML-% IV SOLN
1000.0000 mg | Freq: Once | INTRAVENOUS | Status: AC
  Administered 2024-07-26: 1000 mg via INTRAVENOUS
  Filled 2024-07-26: qty 100

## 2024-07-26 MED ORDER — HYDROMORPHONE HCL 2 MG PO TABS: 2.0000 mg | ORAL_TABLET | Freq: Four times a day (QID) | ORAL | 0 refills | Status: AC | PRN

## 2024-07-26 MED ORDER — HYDROMORPHONE HCL 1 MG/ML IJ SOLN
INTRAMUSCULAR | Status: DC | PRN
  Administered 2024-07-26 (×2): 1 mg via INTRAVENOUS

## 2024-07-26 MED ORDER — OXYCODONE HCL 5 MG/5ML PO SOLN: 5.0000 mg | Freq: Once | ORAL | Status: AC | PRN

## 2024-07-26 MED ORDER — BISACODYL 5 MG PO TBEC
5.0000 mg | DELAYED_RELEASE_TABLET | Freq: Every day | ORAL | Status: DC | PRN
  Administered 2024-07-29: 5 mg via ORAL
  Filled 2024-07-26: qty 1

## 2024-07-26 SURGICAL SUPPLY — 46 items
BAG COUNTER SPONGE SURGICOUNT (BAG) IMPLANT
BAG DECANTER FOR FLEXI CONT (MISCELLANEOUS) ×2 IMPLANT
BLADE SAW SGTL 18X1.27X75 (BLADE) ×2 IMPLANT
BOOTIES KNEE HIGH SLOAN (MISCELLANEOUS) ×2 IMPLANT
CELLS DAT CNTRL 66122 CELL SVR (MISCELLANEOUS) ×1 IMPLANT
COVER PERINEAL POST (MISCELLANEOUS) ×2 IMPLANT
COVER SURGICAL LIGHT HANDLE (MISCELLANEOUS) ×2 IMPLANT
CUP GRIPTON 48MM 100 HIP (Hips) IMPLANT
DRAPE FOOT SWITCH (DRAPES) ×2 IMPLANT
DRAPE IMP U-DRAPE 54X76 (DRAPES) ×2 IMPLANT
DRAPE STERI IOBAN 125X83 (DRAPES) ×2 IMPLANT
DRAPE U-SHAPE 47X51 STRL (DRAPES) ×2 IMPLANT
DRESSING AQUACEL AG SP 3.5X6 (GAUZE/BANDAGES/DRESSINGS) IMPLANT
DRSG AQUACEL AG ADV 3.5X 6 (GAUZE/BANDAGES/DRESSINGS) ×2 IMPLANT
DURAPREP 26ML APPLICATOR (WOUND CARE) ×2 IMPLANT
ELECT BLADE TIP CTD 4 INCH (ELECTRODE) ×2 IMPLANT
ELECT PENCIL ROCKER SW 15FT (MISCELLANEOUS) ×2 IMPLANT
ELECT REM PT RETURN 15FT ADLT (MISCELLANEOUS) ×2 IMPLANT
ELIMINATOR HOLE APEX DEPUY (Hips) IMPLANT
GLOVE BIO SURGEON STRL SZ8 (GLOVE) ×4 IMPLANT
GLOVE BIOGEL PI IND STRL 7.0 (GLOVE) ×2 IMPLANT
GLOVE BIOGEL PI IND STRL 8 (GLOVE) ×4 IMPLANT
GLOVE SURG SYN 7.0 PF PI (GLOVE) ×2 IMPLANT
GOWN SRG XL LVL 4 BRTHBL STRL (GOWNS) ×2 IMPLANT
GOWN STRL REUS W/ TWL XL LVL3 (GOWN DISPOSABLE) ×4 IMPLANT
HEAD FEM STD 32X+1 STRL (Hips) IMPLANT
HOLDER FOLEY CATH W/STRAP (MISCELLANEOUS) ×2 IMPLANT
KIT TURNOVER KIT A (KITS) ×2 IMPLANT
LINER ACET 32X48 (Liner) IMPLANT
MANIFOLD NEPTUNE II (INSTRUMENTS) ×2 IMPLANT
NDL HYPO 22X1.5 SAFETY MO (MISCELLANEOUS) ×2 IMPLANT
NEEDLE HYPO 22X1.5 SAFETY MO (MISCELLANEOUS) ×1 IMPLANT
NS IRRIG 1000ML POUR BTL (IV SOLUTION) ×2 IMPLANT
PACK ANTERIOR HIP CUSTOM (KITS) ×2 IMPLANT
PROTECTOR NERVE ULNAR (MISCELLANEOUS) ×2 IMPLANT
RETRACTOR WND ALEXIS 18 MED (MISCELLANEOUS) ×2 IMPLANT
SPIKE FLUID TRANSFER (MISCELLANEOUS) ×2 IMPLANT
STEM FEM ACTIS STD SZ4 (Stem) IMPLANT
SUT ETHIBOND NAB CT1 #1 30IN (SUTURE) ×4 IMPLANT
SUT VIC AB 1 CT1 36 (SUTURE) ×2 IMPLANT
SUT VIC AB 2-0 CT1 TAPERPNT 27 (SUTURE) ×2 IMPLANT
SUT VICRYL AB 3-0 FS1 BRD 27IN (SUTURE) ×2 IMPLANT
SUTURE STRATFX 0 PDS 27 VIOLET (SUTURE) ×2 IMPLANT
SYR 50ML LL SCALE MARK (SYRINGE) ×2 IMPLANT
TRAY FOLEY MTR SLVR 16FR STAT (SET/KITS/TRAYS/PACK) ×2 IMPLANT
YANKAUER SUCT BULB TIP NO VENT (SUCTIONS) ×2 IMPLANT

## 2024-07-26 NOTE — Plan of Care (Signed)
  Problem: Health Behavior/Discharge Planning: Goal: Ability to manage health-related needs will improve Outcome: Adequate for Discharge   Problem: Clinical Measurements: Goal: Ability to maintain clinical measurements within normal limits will improve Outcome: Progressing Goal: Will remain free from infection Outcome: Progressing Goal: Diagnostic test results will improve Outcome: Progressing Goal: Respiratory complications will improve Outcome: Progressing Goal: Cardiovascular complication will be avoided Outcome: Progressing   Problem: Activity: Goal: Risk for activity intolerance will decrease Outcome: Progressing   Problem: Nutrition: Goal: Adequate nutrition will be maintained Outcome: Adequate for Discharge   Problem: Coping: Goal: Level of anxiety will decrease Outcome: Progressing   Problem: Elimination: Goal: Will not experience complications related to bowel motility Outcome: Progressing Goal: Will not experience complications related to urinary retention Outcome: Completed/Met   Problem: Pain Managment: Goal: General experience of comfort will improve and/or be controlled Outcome: Progressing   Problem: Safety: Goal: Ability to remain free from injury will improve Outcome: Progressing   Problem: Skin Integrity: Goal: Risk for impaired skin integrity will decrease Outcome: Progressing   Problem: Education: Goal: Knowledge of the prescribed therapeutic regimen will improve Outcome: Progressing Goal: Understanding of discharge needs will improve Outcome: Progressing Goal: Individualized Educational Video(s) Outcome: Completed/Met   Problem: Activity: Goal: Ability to avoid complications of mobility impairment will improve Outcome: Progressing Goal: Ability to tolerate increased activity will improve Outcome: Progressing   Problem: Clinical Measurements: Goal: Postoperative complications will be avoided or minimized Outcome: Progressing    Problem: Pain Management: Goal: Pain level will decrease with appropriate interventions Outcome: Progressing   Problem: Skin Integrity: Goal: Will show signs of wound healing Outcome: Progressing

## 2024-07-26 NOTE — Anesthesia Preprocedure Evaluation (Addendum)
 Anesthesia Evaluation  Patient identified by MRN, date of birth, ID band Patient awake    Reviewed: Allergy & Precautions, NPO status , Patient's Chart, lab work & pertinent test results, reviewed documented beta blocker date and time   Airway Mallampati: II  TM Distance: >3 FB     Dental  (+) Edentulous Upper, Edentulous Lower   Pulmonary shortness of breath and with exertion, asthma , COPD,  COPD inhaler, former smoker   Pulmonary exam normal breath sounds clear to auscultation       Cardiovascular hypertension, Pt. on medications Normal cardiovascular exam Rhythm:Regular Rate:Normal  EKG 07/13/24 Sinus bradycardia Possible Left atrial enlargement T wave inversion, consider ischemia No significant change since last tracing  Echo 09/25/22 1. Left ventricular ejection fraction, by estimation, is 60 to 65%. The  left ventricle has normal function. The left ventricle has no regional  wall motion abnormalities. Left ventricular diastolic parameters are  consistent with Grade I diastolic  dysfunction (impaired relaxation). The average left ventricular global  longitudinal strain is -19.0 %. The global longitudinal strain is normal.   2. Right ventricular systolic function is normal. The right ventricular  size is normal. There is normal pulmonary artery systolic pressure.   3. The mitral valve is normal in structure. No evidence of mitral valve  regurgitation. No evidence of mitral stenosis.   4. The aortic valve is normal in structure. Aortic valve regurgitation is  not visualized. No aortic stenosis is present.   5. The inferior vena cava is normal in size with greater than 50%  respiratory variability, suggesting right atrial pressure of 3 mmHg.      Neuro/Psych  PSYCHIATRIC DISORDERS Anxiety Depression    negative neurological ROS     GI/Hepatic ,GERD  Medicated,,(+) Hepatitis -, C  Endo/Other  negative endocrine ROS     Renal/GU Renal InsufficiencyRenal disease  negative genitourinary   Musculoskeletal  (+) Arthritis , Osteoarthritis,  DJD right hip   Abdominal   Peds  Hematology negative hematology ROS (+) Thrombocytopenia- Plt 137k   Anesthesia Other Findings   Reproductive/Obstetrics                              Anesthesia Physical Anesthesia Plan  ASA: 3  Anesthesia Plan: General   Post-op Pain Management: Dilaudid  IV and Precedex    Induction: Intravenous  PONV Risk Score and Plan: 3 and Treatment may vary due to age or medical condition, Ondansetron  and Dexamethasone   Airway Management Planned: Oral ETT and LMA  Additional Equipment: None  Intra-op Plan:   Post-operative Plan: Extubation in OR  Informed Consent: I have reviewed the patients History and Physical, chart, labs and discussed the procedure including the risks, benefits and alternatives for the proposed anesthesia with the patient or authorized representative who has indicated his/her understanding and acceptance.     Dental advisory given  Plan Discussed with: CRNA and Anesthesiologist  Anesthesia Plan Comments: (Patient refuses spinal anesthesia)         Anesthesia Quick Evaluation

## 2024-07-26 NOTE — Anesthesia Procedure Notes (Addendum)
 Procedure Name: Intubation Date/Time: 07/26/2024 10:41 AM  Performed by: Cooper Stamp D, CRNAPre-anesthesia Checklist: Patient identified, Emergency Drugs available, Suction available and Patient being monitored Patient Re-evaluated:Patient Re-evaluated prior to induction Oxygen Delivery Method: Circle system utilized Preoxygenation: Pre-oxygenation with 100% oxygen Induction Type: IV induction Ventilation: Mask ventilation without difficulty Laryngoscope Size: Mac and 4 Grade View: Grade I Tube type: Oral Number of attempts: 1 Airway Equipment and Method: Stylet and Oral airway Placement Confirmation: ETT inserted through vocal cords under direct vision, positive ETCO2 and breath sounds checked- equal and bilateral Secured at: 21 cm Tube secured with: Tape Dental Injury: Teeth and Oropharynx as per pre-operative assessment

## 2024-07-26 NOTE — Evaluation (Signed)
 Physical Therapy Evaluation Patient Details Name: Barbara [name] MRN: [id number] DOB: January 24, 1951 Today's Date: 07/26/2024  History of Present Illness  73 yo female presents to therapy s/p R THA, anterior approach due to failure of conservative measures. Pt PMH includes but is not limited to: asthma, CKD, HTN, GERD, hep C, GAD, COPD, and angina.  Clinical Impression      TARRA PENCE is a 73 y.o. female POD 0 s/p R THA. Patient reports IND with mobility at baseline. Patient is now limited by functional impairments (see PT problem list below) and requires min A for bed mobility and min A for transfers. Patient was unable to ambulate at time of eval due to N and V. Patient instruction initiated in exercise to facilitate ROM and circulation to manage edema. Patient will benefit from continued skilled PT interventions to address impairments and progress towards PLOF. Acute PT will follow to progress mobility and stair training in preparation for safe discharge home with family support and OPPT services. Pt actively vomiting during therapy session, nursing staff aware. O2 saturation reportedly decreased during recovery process, pt on RA and 91% at end of therapy session seated in recliner.      If plan is discharge home, recommend the following: A little help with walking and/or transfers;A little help with bathing/dressing/bathroom;Assistance with cooking/housework;Assist for transportation;Help with stairs or ramp for entrance   Can travel by private vehicle        Equipment Recommendations None recommended by PT  Recommendations for Other Services       Functional Status Assessment Patient has had a recent decline in their functional status and demonstrates the ability to make significant improvements in function in a reasonable and predictable amount of time.     Precautions / Restrictions Precautions Precautions: Fall Restrictions Weight Bearing Restrictions Per Provider Order: No       Mobility  Bed Mobility Overal bed mobility: Needs Assistance Bed Mobility: Supine to Sit     Supine to sit: Min assist, HOB elevated, Used rails     General bed mobility comments: increased time and cues    Transfers Overall transfer level: Needs assistance Equipment used: Rolling walker (2 wheels) Transfers: Bed to chair/wheelchair/BSC   Stand pivot transfers: Min assist         General transfer comment: pt required min A for sit to stand from EOB with push with B UE, CGA once standing with RW for SPT to [facility] and CGA for SPT to recliner, min cues for RW management, good recall for UE placement    Ambulation/Gait               General Gait Details: NT due to N and V  Stairs            Wheelchair Mobility     Tilt Bed    Modified Rankin (Stroke Patients Only)       Balance Overall balance assessment: Needs assistance Sitting-balance support: Feet supported Sitting balance-Leahy Scale: Good     Standing balance support: Bilateral upper extremity supported, During functional activity, Reliant on assistive device for balance Standing balance-Leahy Scale: Poor                               Pertinent Vitals/Pain Pain Assessment Pain Assessment: 0-10 Pain Score: 3  Pain Location: R hip and LE Pain Descriptors / Indicators: Aching, Constant, Discomfort, Grimacing, Dull, Operative site guarding Pain Intervention(s): Limited  activity within patient's tolerance, Monitored during session, Premedicated before session, Repositioned, Ice applied    Home Living Family/patient expects to be discharged to:: Private residence Living Arrangements: Alone Available Help at Discharge: Family (son is going to stay with pt) Type of Home: House Home Access: Stairs to enter Entrance Stairs-Rails: Right;Left (first 3 steps have handrails) Entrance Stairs-Number of Steps: 3 +1   Home Layout: One level Home Equipment: Cane - single Social worker (2 wheels);Toilet riser      Prior Function Prior Level of Function : Independent/Modified Independent             Mobility Comments: IND no AD for all ADLs, self care tasks, IADLs       Extremity/Trunk Assessment        Lower Extremity Assessment Lower Extremity Assessment: RLE deficits/detail RLE Deficits / Details: ankle DF/PF 5/5 RLE Sensation: WNL    Cervical / Trunk Assessment Cervical / Trunk Assessment: Normal  Communication   Communication Communication: No apparent difficulties    Cognition Arousal: Alert (little groggy suspect meds) Behavior During Therapy: Flat affect   PT - Cognitive impairments: No apparent impairments                         Following commands: Intact       Cueing       General Comments      Exercises Total Joint Exercises Ankle Circles/Pumps: AROM, Both, 5 reps   Assessment/Plan    PT Assessment Patient needs continued PT services  PT Problem List Decreased strength;Decreased activity tolerance;Decreased range of motion;Decreased balance;Decreased mobility;Decreased coordination;Pain       PT Treatment Interventions DME instruction;Gait training;Stair training;Functional mobility training;Therapeutic activities;Therapeutic exercise;Balance training;Neuromuscular re-education;Patient/family education;Modalities    PT Goals (Current goals can be found in the Care Plan section)  Acute Rehab PT Goals Patient Stated Goal: to be able to walk no pain PT Goal Formulation: With patient Time For Goal Achievement: 08/16/24 Potential to Achieve Goals: Good    Frequency 7X/week     Co-evaluation               AM-PAC PT 6 Clicks Mobility  Outcome Measure Help needed turning from your back to your side while in a flat bed without using bedrails?: A Little Help needed moving from lying on your back to sitting on the side of a flat bed without using bedrails?: A Little Help needed moving to and from  a bed to a chair (including a wheelchair)?: A Little Help needed standing up from a chair using your arms (e.g., wheelchair or bedside chair)?: A Little Help needed to walk in hospital room?: Total Help needed climbing 3-5 steps with a railing? : Total 6 Click Score: 14    End of Session Equipment Utilized During Treatment: Gait belt Activity Tolerance: Treatment limited secondary to medical complications (Comment) (N and V) Patient left: in chair;with call bell/phone within reach;with chair alarm set Nurse Communication: Mobility status PT Visit Diagnosis: Unsteadiness on feet (R26.81);Other abnormalities of gait and mobility (R26.89);Muscle weakness (generalized) (M62.81);Difficulty in walking, not elsewhere classified (R26.2);Pain Pain - Right/Left: Right Pain - part of body: Hip;Leg    Time: 8169-8145 PT Time Calculation (min) (ACUTE ONLY): 24 min   Charges:   PT Evaluation $PT Eval Low Complexity: 1 Low PT Treatments $Therapeutic Activity: 8-22 mins PT General Charges $$ ACUTE PT VISIT: 1 Visit         [place], PT Acute Rehab  [place] VEAR Drone 07/26/2024, 7:12 PM

## 2024-07-26 NOTE — Telephone Encounter (Signed)
 Medication was refilled on 07/25/2024.  Nothing further needed.

## 2024-07-26 NOTE — Interval H&P Note (Signed)
 History and Physical Interval Note:  07/26/2024 9:41 AM  Barbara [name]  has presented today for surgery, with the diagnosis of RIGHT HIP DEGENERATIVE JOINT DISEASE.  The various methods of treatment have been discussed with the patient and family. After consideration of risks, benefits and other options for treatment, the patient has consented to  Procedure(s): ARTHROPLASTY, HIP, TOTAL, ANTERIOR APPROACH (Right) as a surgical intervention.  The patient's history has been reviewed, patient examined, no change in status, stable for surgery.  I have reviewed the patient's chart and labs.  Questions were answered to the patient's satisfaction.     [name] [name]

## 2024-07-26 NOTE — Op Note (Signed)
 PRE-OP DIAGNOSIS:  RIGHT HIP DEGENERATIVE JOINT DISEASE POST-OP DIAGNOSIS:  same PROCEDURE: RIGHT TOTAL HIP ARTHROPLASTY ANTERIOR APPROACH ANESTHESIA:  General SURGEON:  Maude Herald MD ASSISTANT:  Prentice Earl PA-C   INDICATIONS FOR PROCEDURE:  The patient is a 73 y.o. female with a long history of a painful hip.  This has persisted despite multiple conservative measures.  The patient has persisted with pain and dysfunction making rest and activity difficult.  A total hip replacement is offered as surgical treatment.  Informed operative consent was obtained after discussion of possible complications including reaction to anesthesia, infection, neurovascular injury, dislocation, DVT, PE, and death.  The importance of the postoperative rehab program to optimize result was stressed with the patient.  SUMMARY OF FINDINGS AND PROCEDURE:  Under the above anesthesia through a anterior approach an the Hana table a right THR was performed.  The patient had severe degenerative change and good bone quality.  We used DePuy components to replace the hip and these were size 4 Actis femur capped with a +1 32mm metal hip ball.  On the acetabular side we used a size 48 Gription shell with a  plus 0 neutral polyethylene liner.  We did use a hole eliminator.  Prentice Earl PA-C assisted throughout and was invaluable to the completion of the case in that he helped position and retract while I performed the procedure.  He also closed simultaneously to help minimize OR time.  I used fluoroscopy throughout the case to check position of implants and leg lengths and read all of these views myself.  DESCRIPTION OF PROCEDURE:  The patient was taken to the OR suite where the above anesthetic was applied.  The patient was then positioned on the Hana table supine.  All bony prominences were appropriately padded.  Prep and drape was then performed in normal sterile fashion.  The patient was given kefzol  preoperative antibiotic and an  appropriate time out was performed.  We then took an anterior approach to the right hip.  Dissection was taken through adipose to the tensor fascia lata fascia.  This structure was incised longitudinally and we dissected in the intermuscular interval just medial to this muscle.  Cobra retractors were placed superior and inferior to the femoral neck superficial to the capsule.  A capsular incision was then made and the retractors were placed along the femoral neck.  Xray was brought in to get a good level for the femoral neck cut which was made with an oscillating saw and osteotome.  The femoral head was removed with a corkscrew.  The acetabulum was exposed and some labral tissues were excised. Reaming was taken to the inside wall of the pelvis and sequentially up to 1 mm smaller than the actual component.  A trial of components was done and then the aforementioned acetabular shell was placed in appropriate tilt and anteversion confirmed by fluoroscopy. The liner was placed along with the hole eliminator and attention was turned to the femur.  The leg was brought down and over into adduction and the elevator bar was used to raise the femur up gently in the wound.  The piriformis was released with care taken to preserve the obturator internus attachment and all of the posterior capsule. The femur was reamed and then broached to the appropriate size.  A trial reduction was done and the aforementioned head and neck assembly gave us  the best stability in extension with external rotation.  Leg lengths were felt to be about equal by fluoroscopic  exam.  The trial components were removed and the wound irrigated.  We then placed the femoral component in appropriate anteversion.  The head was applied to a dry stem neck and the hip again reduced.  It was again stable in the aforementioned position.  The would was irrigated again followed by re-approximation of anterior capsule with ethibond suture. Tensor fascia was repaired  with V-loc suture  followed by deep closure with #O and #2 undyed vicryl.  Skin was closed with subQ stitch and steristrips followed by a sterile dressing.  EBL and IOF can be obtained from anesthesia records.  DISPOSITION:  The patient was taken to PACU in stable condition to potentially go home same day depending on ability to walk and tolerate liquids.

## 2024-07-26 NOTE — Transfer of Care (Signed)
 Immediate Anesthesia Transfer of Care Note  Patient: Barbara [name]  Procedure(s) Performed: ARTHROPLASTY, HIP, TOTAL, ANTERIOR APPROACH (Right: Hip)  Patient Location: PACU  Anesthesia Type:General  Level of Consciousness: awake, alert , and oriented  Airway & Oxygen Therapy: Patient Spontanous Breathing and Patient connected to face mask oxygen  Post-op Assessment: Report given to RN and Post -op Vital signs reviewed and unstable, Anesthesiologist notified  Post vital signs: Reviewed and stable  Last Vitals:  Vitals Value Taken Time  BP 149/94 07/26/24 12:11  Temp    Pulse 52 07/26/24 12:15  Resp 18 07/26/24 12:15  SpO2 100 % 07/26/24 12:15  Vitals shown include unfiled device data.  Last Pain:  Vitals:   07/26/24 0829  TempSrc:   PainSc: 0-No pain         Complications: No notable events documented.

## 2024-07-26 NOTE — Anesthesia Postprocedure Evaluation (Signed)
 Anesthesia Post Note  Patient: Barbara [name]  Procedure(s) Performed: ARTHROPLASTY, HIP, TOTAL, ANTERIOR APPROACH (Right: Hip)     Patient location during evaluation: PACU Anesthesia Type: General Level of consciousness: awake and alert and oriented Pain management: pain level controlled Vital Signs Assessment: post-procedure vital signs reviewed and stable Respiratory status: spontaneous breathing, nonlabored ventilation and respiratory function stable Cardiovascular status: blood pressure returned to baseline and stable Postop Assessment: no apparent nausea or vomiting Anesthetic complications: no   No notable events documented.  Last Vitals:  Vitals:   07/26/24 1323 07/26/24 1330  BP:  (!) 115/58  Pulse: (!) 52 (!) 54  Resp: 12 12  Temp:  (!) 36.4 C  SpO2: 93% 92%    Last Pain:  Vitals:   07/26/24 1330  TempSrc:   PainSc: 3     LLE Motor Response: Purposeful movement (07/26/24 1330) LLE Sensation: Full sensation (07/26/24 1330) RLE Motor Response: Purposeful movement (07/26/24 1330) RLE Sensation: Full sensation (07/26/24 1330)      Belinda Bringhurst A.

## 2024-07-27 MED ORDER — DEXMEDETOMIDINE HCL IN NACL 80 MCG/20ML IV SOLN
INTRAVENOUS | Status: AC
  Filled 2024-07-27: qty 20

## 2024-07-27 MED ORDER — SODIUM CHLORIDE 0.9 % IV BOLUS
500.0000 mL | Freq: Once | INTRAVENOUS | Status: AC
  Administered 2024-07-27: 500 mL via INTRAVENOUS

## 2024-07-27 NOTE — Discharge Summary (Addendum)
 Patient ID: Barbara [name] MRN: [id number] DOB/AGE: [birth year] 73 y.o.  Admit date: 07/26/2024 Discharge date: 07/28/2024  Admission Diagnoses:  Principal Problem:   Primary localized osteoarthrosis of right hip Active Problems:   S/P total right hip arthroplasty   Discharge Diagnoses:  Same  Past Medical History:  Diagnosis Date   Arthritis    right knee, neck   Asthma    Chronic kidney disease    COPD (chronic obstructive pulmonary disease) (HCC)    Dyspnea    Hypertension    Mouth trouble    infection from tooth that was pulled approx. mid Mar. 2013    Surgeries: Procedure(s): ARTHROPLASTY, HIP, TOTAL, ANTERIOR APPROACH on 07/26/2024   Consultants:   Discharged Condition: Improved  Hospital Course: Barbara [name] is an 73 y.o. female who was admitted 07/26/2024 for operative treatment ofPrimary localized osteoarthrosis of right hip. Patient has severe unremitting pain that affects sleep, daily activities, and work/hobbies. After pre-op clearance the patient was taken to the operating room on 07/26/2024 and underwent  Procedure(s): ARTHROPLASTY, HIP, TOTAL, ANTERIOR APPROACH.    Patient was given perioperative antibiotics:  Anti-infectives (From admission, onward)    Start     Dose/Rate Route Frequency Ordered Stop   07/26/24 1700  ceFAZolin  (ANCEF ) IVPB 2g/100 mL premix        2 g 200 mL/hr over 30 Minutes Intravenous Every 6 hours 07/26/24 1330 07/27/24 0200   07/26/24 0830  ceFAZolin  (ANCEF ) IVPB 2g/100 mL premix        2 g 200 mL/hr over 30 Minutes Intravenous On call to O.R. 07/26/24 0817 07/26/24 1043        Patient was given sequential compression devices, early ambulation, and chemoprophylaxis to prevent DVT.  Patient benefited maximally from hospital stay and there were no complications.    Recent vital signs: Patient Vitals for the past 24 hrs:  BP Temp Temp src Pulse Resp SpO2 Height Weight  07/27/24 0754 112/69 -- -- 66 -- 92 % -- --  07/27/24  0625 (!) 108/49 98.3 F (36.8 C) Oral 73 18 96 % -- --  07/27/24 0112 103/83 98.3 F (36.8 C) -- 76 18 92 % -- --  07/26/24 2141 116/66 97.6 F (36.4 C) -- (!) 54 16 99 % -- --  07/26/24 1548 (!) 124/58 97.8 F (36.6 C) Oral (!) 52 15 95 % -- --  07/26/24 1515 109/61 -- -- (!) 50 -- 94 % -- --  07/26/24 1500 (!) 107/59 -- -- (!) 51 -- 92 % -- --  07/26/24 1445 110/64 -- -- (!) 51 -- 94 % -- --  07/26/24 1433 -- -- -- -- -- 96 % -- --  07/26/24 1430 118/64 -- -- (!) 57 -- (!) 84 % -- --  07/26/24 1423 117/62 -- -- (!) 58 -- 94 % -- --  07/26/24 1415 (!) 110/56 (!) 97.5 F (36.4 C) -- (!) 58 13 -- -- --  07/26/24 1400 109/65 -- -- (!) 54 14 92 % -- --  07/26/24 1345 114/60 -- -- (!) 52 15 91 % -- --  07/26/24 1330 (!) 115/58 (!) 97.5 F (36.4 C) -- (!) 54 12 92 % -- --  07/26/24 1323 -- -- -- (!) 52 12 93 % -- --  07/26/24 1315 124/65 -- -- (!) 54 15 94 % -- --  07/26/24 1313 -- -- -- (!) 52 10 95 % -- --  07/26/24 1310 -- -- -- (!) 53 11 93 % -- --  07/26/24 1300 124/64 -- -- (!) 51 14 95 % -- --  07/26/24 1254 -- -- -- (!) 57 15 95 % -- --  07/26/24 1245 128/61 -- -- 62 20 93 % -- --  07/26/24 1239 -- -- -- (!) 54 14 95 % -- --  07/26/24 1233 -- -- -- (!) 55 (!) 21 95 % -- --  07/26/24 1230 136/71 -- -- (!) 57 19 92 % -- --  07/26/24 1215 (!) 140/88 -- -- (!) 52 15 100 % -- --  07/26/24 1211 (!) 149/94 98 F (36.7 C) -- 67 20 98 % -- --  07/26/24 0829 -- -- -- -- -- -- 5' 4 (1.626 m) 77.1 kg     Recent laboratory studies: No results for input(s): WBC, HGB, HCT, PLT, NA, K, CL, CO2, BUN, CREATININE, GLUCOSE, INR, CALCIUM  in the last 72 hours.  Invalid input(s): PT, 2   Discharge Medications:   Allergies as of 07/27/2024       Reactions   Nsaids Shortness Of Breath   Clonidine Derivatives    Coughing shortness of breath   Codeine Other (See Comments)   Reaction: hallucination    Ibuprofen    Other reaction(s): NSAIDS took breath away    Lisinopril Hives   Penicillins Hives   Vicodin [hydrocodone -acetaminophen ] Nausea And Vomiting   Procaine Rash        Medication List     TAKE these medications    acetaminophen  500 MG tablet Commonly known as: TYLENOL  Take 1,500 mg by mouth in the morning, at noon, and at bedtime.   albuterol  108 (90 Base) MCG/ACT inhaler Commonly known as: VENTOLIN  HFA Inhale 2 puffs into the lungs every 6 (six) hours as needed for wheezing or shortness of breath. What changed: Another medication with the same name was changed. Make sure you understand how and when to take each.   albuterol  (2.5 MG/3ML) 0.083% nebulizer solution Commonly known as: PROVENTIL  Take 3 mLs (2.5 mg total) by nebulization every 6 (six) hours as needed. Breathing What changed:  reasons to take this additional instructions   amLODipine  10 MG tablet Commonly known as: NORVASC  Take 10 mg by mouth in the morning.   atorvastatin  10 MG tablet Commonly known as: LIPITOR Take 10 mg by mouth in the morning.   azelastine  0.1 % nasal spray Commonly known as: ASTELIN  Place 1 spray into both nostrils 2 (two) times daily. Use in each nostril as directed   CALCIUM  1200 PO Take 1 tablet by mouth daily with lunch.   carboxymethylcellulose 0.5 % Soln Commonly known as: REFRESH PLUS Place 1 drop into both eyes in the morning and at bedtime.   Cod Liver Oil Caps Take 1 capsule by mouth daily with lunch.   diphenhydrAMINE  25 MG tablet Commonly known as: BENADRYL  Take 25 mg by mouth in the morning.   Dupixent  300 MG/2ML Soaj Generic drug: Dupilumab  Inject 300 mg into the skin every 14 (fourteen) days. **appointment needed for further refills**   Fish Oil 1200 MG Caps Take 1,200 mg by mouth daily with lunch.   fluticasone -salmeterol 500-50 MCG/ACT Aepb Commonly known as: ADVAIR  INHALE 1 PUFF INTO THE LUNGS IN THE MORNING AND AT BEDTIME.   hydrochlorothiazide  25 MG tablet Commonly known as: HYDRODIURIL  Take 25  mg by mouth in the morning.   HYDROmorphone  2 MG tablet Commonly known as: Dilaudid  Take 1 tablet (2 mg total) by mouth every 6 (six) hours as needed for up to 10 days  for severe pain (pain score 7-10) or moderate pain (pain score 4-6) (post op pain).   levocetirizine 5 MG tablet Commonly known as: XYZAL  Take 1 tablet (5 mg total) by mouth every evening.   montelukast  10 MG tablet Commonly known as: SINGULAIR  TAKE 1 TABLET AT BEDTIME   pantoprazole  40 MG tablet Commonly known as: PROTONIX  Take 1 tablet (40 mg total) by mouth daily.   promethazine  12.5 MG tablet Commonly known as: PHENERGAN  Take 1-2 tablets (12.5-25 mg total) by mouth every 6 (six) hours as needed for nausea or vomiting.   Restasis  0.05 % ophthalmic emulsion Generic drug: cycloSPORINE  Place 1 drop into both eyes 2 (two) times daily.   Spiriva  Respimat 2.5 MCG/ACT Aers Generic drug: Tiotropium Bromide  Monohydrate Inhale 2 puffs into the lungs every evening.   tiZANidine  4 MG tablet Commonly known as: Zanaflex  Take 1 tablet (4 mg total) by mouth every 6 (six) hours as needed for muscle spasms.   traMADol  50 MG tablet Commonly known as: ULTRAM  Take 50 mg by mouth at bedtime as needed for moderate pain (pain score 4-6).   TURMERIC CURCUMIN PO Take 1 capsule by mouth daily with lunch.   VITAMIN B 12 PO Take 2,500 mcg by mouth daily with lunch.   Vitamin D3 50 MCG (2000 UT) Tabs Take 2,000 Units by mouth daily with lunch.               Durable Medical Equipment  (From admission, onward)           Start     Ordered   07/26/24 1536  DME 3 n 1  Once        07/26/24 1535   07/26/24 1536  DME Bedside commode  Once       Question:  Patient needs a bedside commode to treat with the following condition  Answer:  Primary osteoarthritis of right hip   07/26/24 1535   07/26/24 1307  DME Walker rolling  Once       Question:  Patient needs a walker to treat with the following condition  Answer:   Primary osteoarthritis of right hip   07/26/24 1306            Diagnostic Studies: DG HIP UNILAT WITH PELVIS 1V RIGHT Result Date: 07/26/2024 CLINICAL DATA:  Elective surgery. EXAM: DG HIP (WITH OR WITHOUT PELVIS) 1V RIGHT COMPARISON:  None Available. FINDINGS: Two fluoroscopic spot views of the pelvis and right hip obtained in the operating room. Scratch images during hip arthroplasty. Fluoroscopy time 15 seconds. Dose 2.4286 mGy. IMPRESSION: Intraoperative fluoroscopy during right hip arthroplasty. Electronically Signed   By: Andrea Gasman M.D.   On: 07/26/2024 11:59   DG C-Arm 1-60 Min-No Report Result Date: 07/26/2024 Fluoroscopy was utilized by the requesting physician.  No radiographic interpretation.   DG Chest 2 View Result Date: 07/20/2024 CLINICAL DATA:  Preop for hip surgery. History of hypertension. Ex smoker. EXAM: CHEST - 2 VIEW COMPARISON:  03/19/2017.  CT, 09/09/2023. FINDINGS: Cardiac silhouette is normal in size. No mediastinal or hilar masses. No evidence of adenopathy. Lungs are clear.  No pleural effusion or pneumothorax. Skeletal structures are intact. IMPRESSION: No active cardiopulmonary disease. Electronically Signed   By: Alm Parkins M.D.   On: 07/20/2024 11:13    Disposition: Discharge disposition: 01-Home or Self Care       Discharge Instructions     Call MD / Call 911   Complete by: As directed    If  you experience chest pain or shortness of breath, CALL 911 and be transported to the hospital emergency room.  If you develope a fever above 101 F, pus (white drainage) or increased drainage or redness at the wound, or calf pain, call your surgeon's office.   Call MD / Call 911   Complete by: As directed    If you experience chest pain or shortness of breath, CALL 911 and be transported to the hospital emergency room.  If you develope a fever above 101 F, pus (white drainage) or increased drainage or redness at the wound, or calf pain, call your  surgeon's office.   Constipation Prevention   Complete by: As directed    Drink plenty of fluids.  Prune juice may be helpful.  You may use a stool softener, such as Colace (over the counter) 100 mg twice a day.  Use MiraLax (over the counter) for constipation as needed.   Constipation Prevention   Complete by: As directed    Drink plenty of fluids.  Prune juice may be helpful.  You may use a stool softener, such as Colace (over the counter) 100 mg twice a day.  Use MiraLax (over the counter) for constipation as needed.   Diet - low sodium heart healthy   Complete by: As directed    Diet - low sodium heart healthy   Complete by: As directed    Discharge instructions   Complete by: As directed    INSTRUCTIONS AFTER JOINT REPLACEMENT   Remove items at home which could result in a fall. This includes throw rugs or furniture in walking pathways ICE to the affected joint every three hours while awake for 30 minutes at a time, for at least the first 3-5 days, and then as needed for pain and swelling.  Continue to use ice for pain and swelling. You may notice swelling that will progress down to the foot and ankle.  This is normal after surgery.  Elevate your leg when you are not up walking on it.   Continue to use the breathing machine you got in the hospital (incentive spirometer) which will help keep your temperature down.  It is common for your temperature to cycle up and down following surgery, especially at night when you are not up moving around and exerting yourself.  The breathing machine keeps your lungs expanded and your temperature down.   DIET:  As you were doing prior to hospitalization, we recommend a well-balanced diet.  DRESSING / WOUND CARE / SHOWERING  You may shower 3 days after surgery, but keep the wounds dry during showering.  You may use an occlusive plastic wrap (Press'n Seal for example), NO SOAKING/SUBMERGING IN THE BATHTUB.  If the bandage gets wet, change with a clean dry  gauze.  If the incision gets wet, pat the wound dry with a clean towel.  ACTIVITY  Increase activity slowly as tolerated, but follow the weight bearing instructions below.   No driving for 6 weeks or until further direction given by your physician.  You cannot drive while taking narcotics.  No lifting or carrying greater than 10 lbs. until further directed by your surgeon. Avoid periods of inactivity such as sitting longer than an hour when not asleep. This helps prevent blood clots.  You may return to work once you are authorized by your doctor.     WEIGHT BEARING   Weight bearing as tolerated with assist device (walker, cane, etc) as directed, use it as long  as suggested by your surgeon or therapist, typically at least 4-6 weeks.   EXERCISES  Results after joint replacement surgery are often greatly improved when you follow the exercise, range of motion and muscle strengthening exercises prescribed by your doctor. Safety measures are also important to protect the joint from further injury. Any time any of these exercises cause you to have increased pain or swelling, decrease what you are doing until you are comfortable again and then slowly increase them. If you have problems or questions, call your caregiver or physical therapist for advice.   Rehabilitation is important following a joint replacement. After just a few days of immobilization, the muscles of the leg can become weakened and shrink (atrophy).  These exercises are designed to build up the tone and strength of the thigh and leg muscles and to improve motion. Often times heat used for twenty to thirty minutes before working out will loosen up your tissues and help with improving the range of motion but do not use heat for the first two weeks following surgery (sometimes heat can increase post-operative swelling).   These exercises can be done on a training (exercise) mat, on the floor, on a table or on a bed. Use whatever works  the best and is most comfortable for you.    Use music or television while you are exercising so that the exercises are a pleasant break in your day. This will make your life better with the exercises acting as a break in your routine that you can look forward to.   Perform all exercises about fifteen times, three times per day or as directed.  You should exercise both the operative leg and the other leg as well.  Exercises include:   Quad Sets - Tighten up the muscle on the front of the thigh (Quad) and hold for 5-10 seconds.   Straight Leg Raises - With your knee straight (if you were given a brace, keep it on), lift the leg to 60 degrees, hold for 3 seconds, and slowly lower the leg.  Perform this exercise against resistance later as your leg gets stronger.  Leg Slides: Lying on your back, slowly slide your foot toward your buttocks, bending your knee up off the floor (only go as far as is comfortable). Then slowly slide your foot back down until your leg is flat on the floor again.  Angel Wings: Lying on your back spread your legs to the side as far apart as you can without causing discomfort.  Hamstring Strength:  Lying on your back, push your heel against the floor with your leg straight by tightening up the muscles of your buttocks.  Repeat, but this time bend your knee to a comfortable angle, and push your heel against the floor.  You may put a pillow under the heel to make it more comfortable if necessary.   A rehabilitation program following joint replacement surgery can speed recovery and prevent re-injury in the future due to weakened muscles. Contact your doctor or a physical therapist for more information on knee rehabilitation.    CONSTIPATION  Constipation is defined medically as fewer than three stools per week and severe constipation as less than one stool per week.  Even if you have a regular bowel pattern at home, your normal regimen is likely to be disrupted due to multiple  reasons following surgery.  Combination of anesthesia, postoperative narcotics, change in appetite and fluid intake all can affect your bowels.   YOU MUST  use at least one of the following options; they are listed in order of increasing strength to get the job done.  They are all available over the counter, and you may need to use some, POSSIBLY even all of these options:    Drink plenty of fluids (prune juice may be helpful) and high fiber foods Colace 100 mg by mouth twice a day  Senokot for constipation as directed and as needed Dulcolax (bisacodyl ), take with full glass of water  Miralax (polyethylene glycol) once or twice a day as needed.  If you have tried all these things and are unable to have a bowel movement in the first 3-4 days after surgery call either your surgeon or your primary doctor.    If you experience loose stools or diarrhea, hold the medications until you stool forms back up.  If your symptoms do not get better within 1 week or if they get worse, check with your doctor.  If you experience the worst abdominal pain ever or develop nausea or vomiting, please contact the office immediately for further recommendations for treatment.   ITCHING:  If you experience itching with your medications, try taking only a single pain pill, or even half a pain pill at a time.  You can also use Benadryl  over the counter for itching or also to help with sleep.   TED HOSE STOCKINGS:  Use stockings on both legs until for at least 2 weeks or as directed by physician office. They may be removed at night for sleeping.  MEDICATIONS:  See your medication summary on the After Visit Summary that nursing will review with you.  You may have some home medications which will be placed on hold until you complete the course of blood thinner medication.  It is important for you to complete the blood thinner medication as prescribed.  PRECAUTIONS:  If you experience chest pain or shortness of breath - call  911 immediately for transfer to the hospital emergency department.   If you develop a fever greater that 101 F, purulent drainage from wound, increased redness or drainage from wound, foul odor from the wound/dressing, or calf pain - CONTACT YOUR SURGEON.                                                   FOLLOW-UP APPOINTMENTS:  If you do not already have a post-op appointment, please call the office for an appointment to be seen by your surgeon.  Guidelines for how soon to be seen are listed in your After Visit Summary, but are typically between 1-4 weeks after surgery.  OTHER INSTRUCTIONS:   Knee Replacement:  Do not place pillow under knee, focus on keeping the knee straight while resting. CPM instructions: 0-90 degrees, 2 hours in the morning, 2 hours in the afternoon, and 2 hours in the evening. Place foam block, curve side up under heel at all times except when in CPM or when walking.  DO NOT modify, tear, cut, or change the foam block in any way.  POST-OPERATIVE OPIOID TAPER INSTRUCTIONS: It is important to wean off of your opioid medication as soon as possible. If you do not need pain medication after your surgery it is ok to stop day one. Opioids include: Codeine, Hydrocodone (Norco, Vicodin), Oxycodone (Percocet, oxycontin ) and hydromorphone  amongst others.  Long term and even short term  use of opiods can cause: Increased pain response Dependence Constipation Depression Respiratory depression And more.  Withdrawal symptoms can include Flu like symptoms Nausea, vomiting And more Techniques to manage these symptoms Hydrate well Eat regular healthy meals Stay active Use relaxation techniques(deep breathing, meditating, yoga) Do Not substitute Alcohol  to help with tapering If you have been on opioids for less than two weeks and do not have pain than it is ok to stop all together.  Plan to wean off of opioids This plan should start within one week post op of your joint  replacement. Maintain the same interval or time between taking each dose and first decrease the dose.  Cut the total daily intake of opioids by one tablet each day Next start to increase the time between doses. The last dose that should be eliminated is the evening dose.     MAKE SURE YOU:  Understand these instructions.  Get help right away if you are not doing well or get worse.    Thank you for letting us  be a part of your medical care team.  It is a privilege we respect greatly.  We hope these instructions will help you stay on track for a fast and full recovery!   Discharge instructions   Complete by: As directed    INSTRUCTIONS AFTER JOINT REPLACEMENT   Remove items at home which could result in a fall. This includes throw rugs or furniture in walking pathways ICE to the affected joint every three hours while awake for 30 minutes at a time, for at least the first 3-5 days, and then as needed for pain and swelling.  Continue to use ice for pain and swelling. You may notice swelling that will progress down to the foot and ankle.  This is normal after surgery.  Elevate your leg when you are not up walking on it.   Continue to use the breathing machine you got in the hospital (incentive spirometer) which will help keep your temperature down.  It is common for your temperature to cycle up and down following surgery, especially at night when you are not up moving around and exerting yourself.  The breathing machine keeps your lungs expanded and your temperature down.   DIET:  As you were doing prior to hospitalization, we recommend a well-balanced diet.  DRESSING / WOUND CARE / SHOWERING  You may shower 3 days after surgery, but keep the wounds dry during showering.  You may use an occlusive plastic wrap (Press'n Seal for example), NO SOAKING/SUBMERGING IN THE BATHTUB.  If the bandage gets wet, change with a clean dry gauze.  If the incision gets wet, pat the wound dry with a clean  towel.  ACTIVITY  Increase activity slowly as tolerated, but follow the weight bearing instructions below.   No driving for 6 weeks or until further direction given by your physician.  You cannot drive while taking narcotics.  No lifting or carrying greater than 10 lbs. until further directed by your surgeon. Avoid periods of inactivity such as sitting longer than an hour when not asleep. This helps prevent blood clots.  You may return to work once you are authorized by your doctor.     WEIGHT BEARING   Weight bearing as tolerated with assist device (walker, cane, etc) as directed, use it as long as suggested by your surgeon or therapist, typically at least 4-6 weeks.   EXERCISES  Results after joint replacement surgery are often greatly improved when you follow  the exercise, range of motion and muscle strengthening exercises prescribed by your doctor. Safety measures are also important to protect the joint from further injury. Any time any of these exercises cause you to have increased pain or swelling, decrease what you are doing until you are comfortable again and then slowly increase them. If you have problems or questions, call your caregiver or physical therapist for advice.   Rehabilitation is important following a joint replacement. After just a few days of immobilization, the muscles of the leg can become weakened and shrink (atrophy).  These exercises are designed to build up the tone and strength of the thigh and leg muscles and to improve motion. Often times heat used for twenty to thirty minutes before working out will loosen up your tissues and help with improving the range of motion but do not use heat for the first two weeks following surgery (sometimes heat can increase post-operative swelling).   These exercises can be done on a training (exercise) mat, on the floor, on a table or on a bed. Use whatever works the best and is most comfortable for you.    Use music or  television while you are exercising so that the exercises are a pleasant break in your day. This will make your life better with the exercises acting as a break in your routine that you can look forward to.   Perform all exercises about fifteen times, three times per day or as directed.  You should exercise both the operative leg and the other leg as well.  Exercises include:   Quad Sets - Tighten up the muscle on the front of the thigh (Quad) and hold for 5-10 seconds.   Straight Leg Raises - With your knee straight (if you were given a brace, keep it on), lift the leg to 60 degrees, hold for 3 seconds, and slowly lower the leg.  Perform this exercise against resistance later as your leg gets stronger.  Leg Slides: Lying on your back, slowly slide your foot toward your buttocks, bending your knee up off the floor (only go as far as is comfortable). Then slowly slide your foot back down until your leg is flat on the floor again.  Angel Wings: Lying on your back spread your legs to the side as far apart as you can without causing discomfort.  Hamstring Strength:  Lying on your back, push your heel against the floor with your leg straight by tightening up the muscles of your buttocks.  Repeat, but this time bend your knee to a comfortable angle, and push your heel against the floor.  You may put a pillow under the heel to make it more comfortable if necessary.   A rehabilitation program following joint replacement surgery can speed recovery and prevent re-injury in the future due to weakened muscles. Contact your doctor or a physical therapist for more information on knee rehabilitation.    CONSTIPATION  Constipation is defined medically as fewer than three stools per week and severe constipation as less than one stool per week.  Even if you have a regular bowel pattern at home, your normal regimen is likely to be disrupted due to multiple reasons following surgery.  Combination of anesthesia,  postoperative narcotics, change in appetite and fluid intake all can affect your bowels.   YOU MUST use at least one of the following options; they are listed in order of increasing strength to get the job done.  They are all available over the  counter, and you may need to use some, POSSIBLY even all of these options:    Drink plenty of fluids (prune juice may be helpful) and high fiber foods Colace 100 mg by mouth twice a day  Senokot for constipation as directed and as needed Dulcolax (bisacodyl ), take with full glass of water  Miralax (polyethylene glycol) once or twice a day as needed.  If you have tried all these things and are unable to have a bowel movement in the first 3-4 days after surgery call either your surgeon or your primary doctor.    If you experience loose stools or diarrhea, hold the medications until you stool forms back up.  If your symptoms do not get better within 1 week or if they get worse, check with your doctor.  If you experience the worst abdominal pain ever or develop nausea or vomiting, please contact the office immediately for further recommendations for treatment.   ITCHING:  If you experience itching with your medications, try taking only a single pain pill, or even half a pain pill at a time.  You can also use Benadryl  over the counter for itching or also to help with sleep.   TED HOSE STOCKINGS:  Use stockings on both legs until for at least 2 weeks or as directed by physician office. They may be removed at night for sleeping.  MEDICATIONS:  See your medication summary on the After Visit Summary that nursing will review with you.  You may have some home medications which will be placed on hold until you complete the course of blood thinner medication.  It is important for you to complete the blood thinner medication as prescribed.  PRECAUTIONS:  If you experience chest pain or shortness of breath - call 911 immediately for transfer to the hospital emergency  department.   If you develop a fever greater that 101 F, purulent drainage from wound, increased redness or drainage from wound, foul odor from the wound/dressing, or calf pain - CONTACT YOUR SURGEON.                                                   FOLLOW-UP APPOINTMENTS:  If you do not already have a post-op appointment, please call the office for an appointment to be seen by your surgeon.  Guidelines for how soon to be seen are listed in your After Visit Summary, but are typically between 1-4 weeks after surgery.  OTHER INSTRUCTIONS:   Knee Replacement:  Do not place pillow under knee, focus on keeping the knee straight while resting. CPM instructions: 0-90 degrees, 2 hours in the morning, 2 hours in the afternoon, and 2 hours in the evening. Place foam block, curve side up under heel at all times except when in CPM or when walking.  DO NOT modify, tear, cut, or change the foam block in any way.  POST-OPERATIVE OPIOID TAPER INSTRUCTIONS: It is important to wean off of your opioid medication as soon as possible. If you do not need pain medication after your surgery it is ok to stop day one. Opioids include: Codeine, Hydrocodone (Norco, Vicodin), Oxycodone (Percocet, oxycontin ) and hydromorphone  amongst others.  Long term and even short term use of opiods can cause: Increased pain response Dependence Constipation Depression Respiratory depression And more.  Withdrawal symptoms can include Flu like symptoms Nausea, vomiting And more Techniques  to manage these symptoms Hydrate well Eat regular healthy meals Stay active Use relaxation techniques(deep breathing, meditating, yoga) Do Not substitute Alcohol  to help with tapering If you have been on opioids for less than two weeks and do not have pain than it is ok to stop all together.  Plan to wean off of opioids This plan should start within one week post op of your joint replacement. Maintain the same interval or time between taking  each dose and first decrease the dose.  Cut the total daily intake of opioids by one tablet each day Next start to increase the time between doses. The last dose that should be eliminated is the evening dose.     MAKE SURE YOU:  Understand these instructions.  Get help right away if you are not doing well or get worse.    Thank you for letting us  be a part of your medical care team.  It is a privilege we respect greatly.  We hope these instructions will help you stay on track for a fast and full recovery!   Increase activity slowly as tolerated   Complete by: As directed    Increase activity slowly as tolerated   Complete by: As directed    Post-operative opioid taper instructions:   Complete by: As directed    POST-OPERATIVE OPIOID TAPER INSTRUCTIONS: It is important to wean off of your opioid medication as soon as possible. If you do not need pain medication after your surgery it is ok to stop day one. Opioids include: Codeine, Hydrocodone (Norco, Vicodin), Oxycodone (Percocet, oxycontin ) and hydromorphone  amongst others.  Long term and even short term use of opiods can cause: Increased pain response Dependence Constipation Depression Respiratory depression And more.  Withdrawal symptoms can include Flu like symptoms Nausea, vomiting And more Techniques to manage these symptoms Hydrate well Eat regular healthy meals Stay active Use relaxation techniques(deep breathing, meditating, yoga) Do Not substitute Alcohol  to help with tapering If you have been on opioids for less than two weeks and do not have pain than it is ok to stop all together.  Plan to wean off of opioids This plan should start within one week post op of your joint replacement. Maintain the same interval or time between taking each dose and first decrease the dose.  Cut the total daily intake of opioids by one tablet each day Next start to increase the time between doses. The last dose that should be  eliminated is the evening dose.      Post-operative opioid taper instructions:   Complete by: As directed    POST-OPERATIVE OPIOID TAPER INSTRUCTIONS: It is important to wean off of your opioid medication as soon as possible. If you do not need pain medication after your surgery it is ok to stop day one. Opioids include: Codeine, Hydrocodone (Norco, Vicodin), Oxycodone (Percocet, oxycontin ) and hydromorphone  amongst others.  Long term and even short term use of opiods can cause: Increased pain response Dependence Constipation Depression Respiratory depression And more.  Withdrawal symptoms can include Flu like symptoms Nausea, vomiting And more Techniques to manage these symptoms Hydrate well Eat regular healthy meals Stay active Use relaxation techniques(deep breathing, meditating, yoga) Do Not substitute Alcohol  to help with tapering If you have been on opioids for less than two weeks and do not have pain than it is ok to stop all together.  Plan to wean off of opioids This plan should start within one week post op of your joint replacement. Maintain the same interval or  time between taking each dose and first decrease the dose.  Cut the total daily intake of opioids by one tablet each day Next start to increase the time between doses. The last dose that should be eliminated is the evening dose.           Follow-up Information     Sheril Coy, MD. Go on 08/05/2024.   Specialty: Orthopedic Surgery Why: your appointment is scheduled for 9:30. Contact information: [address] [place] [address] [phone]         [facility] Go on 07/29/2024.   Why: Your outpatient physical therapy is scheduled for 2:40. Please arrive at 2:30 to complete your paperwork Contact information: Physical Therapy [address] [place] [address] [phone]                  Signed: Prentice Mt Patriciaann Rabanal 07/27/2024, 8:20 AM

## 2024-07-27 NOTE — TOC Transition Note (Signed)
 Transition of Care [facility]) - Discharge Note   Patient Details  Name: Barbara [name] MRN: [id number] Date of Birth: [birth year]  Transition of Care [facility]) CM/SW Contact:  NORMAN ASPEN, LCSW Phone Number: 07/27/2024, 11:02 AM   Clinical Narrative:     Met with pt who confirms she has needed DME in the home.  OPPT already arranged with [facility].  No further TOC needs.  Final next level of care: OP Rehab Barriers to Discharge: No Barriers Identified   Patient Goals and CMS Choice Patient states their goals for this hospitalization and ongoing recovery are:: return home          Discharge Placement                       Discharge Plan and Services Additional resources added to the After Visit Summary for                  DME Arranged: N/A DME Agency: NA                  Social Drivers of Health (SDOH) Interventions SDOH Screenings   Food Insecurity: No Food Insecurity (07/26/2024)  Housing: Low Risk  (07/26/2024)  Transportation Needs: No Transportation Needs (07/26/2024)  Utilities: Not At Risk (07/26/2024)  Depression (PHQ2-9): Low Risk  (08/20/2023)  Tobacco Use: Medium Risk (07/26/2024)     Readmission Risk Interventions     No data to display

## 2024-07-27 NOTE — Progress Notes (Signed)
 Physical Therapy Treatment Patient Details Name: Barbara [name] MRN: [id number] DOB: [birth year] Today's Date: 07/27/2024   History of Present Illness 73 yo female presents to therapy s/p R THA, anterior approach due to failure of conservative measures. Pt PMH includes but is not limited to: asthma, CKD, HTN, GERD, hep C, GAD, COPD, and angina.    PT Comments  Pt pre-medicated prior to pm session and received bolus fluids with improvement in BP and less lightheadedness. Pt continues to struggle with 8/10 pain, requiring CG/MinA at times for sit<>stand transfers. She was able to increase gait distance slightly with RW, however continues to rely heavily on RW and was unable to attempt stair training this pm. Will attempt to progress in am.    If plan is discharge home, recommend the following: A little help with walking and/or transfers;A little help with bathing/dressing/bathroom;Assistance with cooking/housework;Assist for transportation;Help with stairs or ramp for entrance   Can travel by private vehicle        Equipment Recommendations  None recommended by PT    Recommendations for Other Services       Precautions / Restrictions Precautions Precautions: Fall Recall of Precautions/Restrictions: Intact Restrictions Weight Bearing Restrictions Per Provider Order: Yes RLE Weight Bearing Per Provider Order: Weight bearing as tolerated Other Position/Activity Restrictions: Ant. Hip, no Precautions     Mobility  Bed Mobility               General bed mobility comments:  (Pt in chair pre/post session)    Transfers Overall transfer level: Needs assistance Equipment used: Rolling walker (2 wheels) Transfers: Sit to/from Stand Sit to Stand: Min assist           General transfer comment: MinA to stand from recliner, slow power up    Ambulation/Gait Ambulation/Gait assistance: Contact guard assist Gait Distance (Feet):  (45) Assistive device: Rolling walker (2  wheels) Gait Pattern/deviations: Step-to pattern, Decreased stride length, Decreased weight shift to right, Antalgic Gait velocity: decreased     General Gait Details: No increase in nausea during gait training   Stairs Stairs:  (Pt will need stair training prior to d/c)           Wheelchair Mobility     Tilt Bed    Modified Rankin (Stroke Patients Only)       Balance Overall balance assessment: Needs assistance Sitting-balance support: Feet supported Sitting balance-Leahy Scale: Good     Standing balance support: Bilateral upper extremity supported, During functional activity, Reliant on assistive device for balance Standing balance-Leahy Scale: Fair Standing balance comment: Fair with static standing at [employer] Communication: No apparent difficulties  Cognition Arousal: Alert Behavior During Therapy: WFL for tasks assessed/performed   PT - Cognitive impairments: No apparent impairments                         Following commands: Intact      Cueing Cueing Techniques: Verbal cues  Exercises Total Joint Exercises Ankle Circles/Pumps: AROM, Both, 10 reps, Seated General Exercises - Lower Extremity Long Arc Quad: AROM, Right, 10 reps, Seated    General Comments        Pertinent Vitals/Pain Pain Assessment Pain Assessment: 0-10 Pain Score: 8  Pain Location: R hip and LE Pain Descriptors / Indicators: Aching, Constant, Discomfort, Grimacing, Dull,  Operative site guarding Pain Intervention(s): Premedicated before session    Home Living                          Prior Function            PT Goals (current goals can now be found in the care plan section) Acute Rehab PT Goals Patient Stated Goal: to be able to walk no pain Progress towards PT goals: Progressing toward goals    Frequency    7X/week      PT Plan      Co-evaluation              AM-PAC  PT 6 Clicks Mobility   Outcome Measure  Help needed turning from your back to your side while in a flat bed without using bedrails?: A Little Help needed moving from lying on your back to sitting on the side of a flat bed without using bedrails?: A Little Help needed moving to and from a bed to a chair (including a wheelchair)?: A Little Help needed standing up from a chair using your arms (e.g., wheelchair or bedside chair)?: A Little Help needed to walk in hospital room?: Total Help needed climbing 3-5 steps with a railing? : Total 6 Click Score: 14    End of Session Equipment Utilized During Treatment: Gait belt Activity Tolerance: Patient limited by pain Patient left: in chair;with call bell/phone within reach;with chair alarm set;with family/visitor present Nurse Communication: Mobility status PT Visit Diagnosis: Unsteadiness on feet (R26.81);Other abnormalities of gait and mobility (R26.89);Muscle weakness (generalized) (M62.81);Difficulty in walking, not elsewhere classified (R26.2);Pain Pain - Right/Left: Right Pain - part of body: Hip;Leg     Time: 8543-8478 PT Time Calculation (min) (ACUTE ONLY): 25 min  Charges:    $Gait Training: 8-22 mins $Therapeutic Activity: 8-22 mins PT General Charges $$ ACUTE PT VISIT: 1 Visit                    Darice Bohr, PTA  Darice JAYSON Bohr 07/27/2024, 5:00 PM

## 2024-07-27 NOTE — Progress Notes (Signed)
 Subjective: 1 Day Post-Op Procedure(s) (LRB): ARTHROPLASTY, HIP, TOTAL, ANTERIOR APPROACH (Right)  Patient doing well. She is wanting to go home today.   Activity level:  WBAT Diet tolerance:  ok Voiding:  ok Patient reports pain as mild.    Objective: Vital signs in last 24 hours: Temp:  [97.5 F (36.4 C)-98.3 F (36.8 C)] 98.3 F (36.8 C) (08/20 0625) Pulse Rate:  [50-76] 66 (08/20 0754) Resp:  [10-21] 18 (08/20 0625) BP: (103-149)/(49-94) 112/69 (08/20 0754) SpO2:  [84 %-100 %] 92 % (08/20 0754) Weight:  [77.1 kg] 77.1 kg (08/19 0829)  Labs: No results for input(s): HGB in the last 72 hours. No results for input(s): WBC, RBC, HCT, PLT in the last 72 hours. No results for input(s): NA, K, CL, CO2, BUN, CREATININE, GLUCOSE, CALCIUM  in the last 72 hours. No results for input(s): LABPT, INR in the last 72 hours.  Physical Exam:  Neurologically intact ABD soft Neurovascular intact Sensation intact distally Intact pulses distally Dorsiflexion/Plantar flexion intact Incision: dressing C/D/I and no drainage No cellulitis present Compartment soft  Assessment/Plan:  1 Day Post-Op Procedure(s) (LRB): ARTHROPLASTY, HIP, TOTAL, ANTERIOR APPROACH (Right) Advance diet Up with therapy D/C IV fluids Discharge home with home health after PT if cleared and doing well. Follow up in office 2 weeks post op. Continue on 81mg  asa BID for dvt prevention.    Prentice Mt Lavaeh Bau 07/27/2024, 8:17 AM

## 2024-07-27 NOTE — Progress Notes (Signed)
 Physical Therapy Treatment Patient Details Name: Barbara [name] MRN: [id number] DOB: 1951-05-19 Today's Date: 07/27/2024   History of Present Illness 73 yo female presents to therapy s/p R THA, anterior approach due to failure of conservative measures. Pt PMH includes but is not limited to: asthma, CKD, HTN, GERD, hep C, GAD, COPD, and angina.    PT Comments  Pt received in chair, c/o nausea and lightheadedness. BP in sitting and standing without significant change, however pt was symptomatic and only able to tolerate 40ft of gait training at a time. Pt will benefit from another session of PT in pm after receiving fluid bolus to better progress and assess for safe transition home.    If plan is discharge home, recommend the following: A little help with walking and/or transfers;A little help with bathing/dressing/bathroom;Assistance with cooking/housework;Assist for transportation;Help with stairs or ramp for entrance   Can travel by private vehicle        Equipment Recommendations  None recommended by PT    Recommendations for Other Services       Precautions / Restrictions Precautions Precautions: Fall Recall of Precautions/Restrictions: Intact Restrictions Weight Bearing Restrictions Per Provider Order: Yes RLE Weight Bearing Per Provider Order: Weight bearing as tolerated Other Position/Activity Restrictions: Ant. Hip, no Precautions     Mobility  Bed Mobility               General bed mobility comments:  (NT, in chair pre/post session)    Transfers Overall transfer level: Needs assistance Equipment used: Rolling walker (2 wheels) Transfers: Sit to/from Stand Sit to Stand: Min assist           General transfer comment: MinA to stand from recliner, slow power up    Ambulation/Gait Ambulation/Gait assistance: Min assist Gait Distance (Feet): 20 Feet Assistive device: Rolling walker (2 wheels) Gait Pattern/deviations: Step-to pattern, Decreased stride  length, Decreased weight shift to right, Antalgic Gait velocity: decreased     General Gait Details: increased c/o nausea and light headedness during gait, followed closely with chair   Stairs Stairs:  (Pt will need to do stairs prior to d/c)           Wheelchair Mobility     Tilt Bed    Modified Rankin (Stroke Patients Only)       Balance Overall balance assessment: Needs assistance Sitting-balance support: Feet supported Sitting balance-Leahy Scale: Good     Standing balance support: Bilateral upper extremity supported, During functional activity, Reliant on assistive device for balance Standing balance-Leahy Scale: Poor Standing balance comment: Fair with static standing at [employer] Communication Communication: No apparent difficulties  Cognition Arousal: Alert Behavior During Therapy: WFL for tasks assessed/performed   PT - Cognitive impairments: No apparent impairments                         Following commands: Intact      Cueing Cueing Techniques: Verbal cues  Exercises Total Joint Exercises Ankle Circles/Pumps: AROM, Both, 10 reps, Seated General Exercises - Lower Extremity Long Arc Quad: AAROM, Right, 10 reps, Seated    General Comments General comments (skin integrity, edema, etc.):  (Right hip dressing dry and inctact. BP assessed in sitting and standing without significant change, increased dizziness and nausea noted in standing)      Pertinent Vitals/Pain  Pain Assessment Pain Assessment: 0-10 Pain Score: 8  Pain Location: R hip and LE Pain Descriptors / Indicators: Aching, Constant, Discomfort, Grimacing, Dull, Operative site guarding Pain Intervention(s): Limited activity within patient's tolerance, Premedicated before session    Home Living                          Prior Function            PT Goals (current goals can now be found in the care plan section) Acute  Rehab PT Goals Patient Stated Goal: to be able to walk no pain Progress towards PT goals: Progressing toward goals    Frequency    7X/week      PT Plan      Co-evaluation              AM-PAC PT 6 Clicks Mobility   Outcome Measure  Help needed turning from your back to your side while in a flat bed without using bedrails?: A Little Help needed moving from lying on your back to sitting on the side of a flat bed without using bedrails?: A Little Help needed moving to and from a bed to a chair (including a wheelchair)?: A Little Help needed standing up from a chair using your arms (e.g., wheelchair or bedside chair)?: A Little Help needed to walk in hospital room?: Total Help needed climbing 3-5 steps with a railing? : Total 6 Click Score: 14    End of Session Equipment Utilized During Treatment: Gait belt Activity Tolerance: Treatment limited secondary to medical complications (Comment);Other (comment) (Nausea, dizziness) Patient left: in chair;with call bell/phone within reach;with chair alarm set Nurse Communication: Mobility status PT Visit Diagnosis: Unsteadiness on feet (R26.81);Other abnormalities of gait and mobility (R26.89);Muscle weakness (generalized) (M62.81);Difficulty in walking, not elsewhere classified (R26.2);Pain Pain - Right/Left: Right Pain - part of body: Hip;Leg     Time: 9047-8972 PT Time Calculation (min) (ACUTE ONLY): 35 min  Charges:    $Gait Training: 8-22 mins $Therapeutic Activity: 8-22 mins PT General Charges $$ ACUTE PT VISIT: 1 Visit                    Darice Bohr, PTA  Darice JAYSON Bohr 07/27/2024, 11:54 AM

## 2024-07-27 NOTE — Progress Notes (Signed)
 PT has respiratory meds from home. PT requested Incruse and Breo be sent back to pharmacy for credit- done.

## 2024-07-28 ENCOUNTER — Encounter (HOSPITAL_COMMUNITY): Payer: Self-pay | Admitting: Orthopaedic Surgery

## 2024-07-28 MED ORDER — ASPIRIN 81 MG PO TBEC: 81.0000 mg | DELAYED_RELEASE_TABLET | Freq: Two times a day (BID) | ORAL | 0 refills | Status: AC

## 2024-07-28 NOTE — Plan of Care (Signed)
   Problem: Health Behavior/Discharge Planning: Goal: Ability to manage health-related needs will improve Outcome: Progressing   Problem: Clinical Measurements: Goal: Ability to maintain clinical measurements within normal limits will improve Outcome: Progressing Goal: Will remain free from infection Outcome: Progressing

## 2024-07-28 NOTE — Progress Notes (Signed)
 Notified Prentice Earl NP by phone. Patient started complaining about pain/spasm behind right knee about 1 hour after working with PT and sitting in the chair. Robaxin  500 mg given at 1320. Around 1420 patient stated that she needed to get into the bed because her left knee was painful. I got her into bed and then notified Prentice Earl. He stated that she could spend the night again tonight and to ice her left leg/knee.

## 2024-07-28 NOTE — Progress Notes (Signed)
 Pt continued to c/o pain 10/10, describing sharp, throbbing to Left knee radiating to left lower extremity. LLE feels hot to touch, + 2 pulse present in both popliteal and dorsalis pedis, non-pitting edema present on left knee. Pt refuses hurting or bumping the left knee during activity. Vital signs taken O2 88 in room air, BP 100/58, temp 98.9, resp 16, pulse 70. Started on 2 L via [place], O2 went up to 97%. HOB elevated to 45 degree. Respiratory team notified and neb tx given with some relief. PA Lenis Barter notified and received order for Doppler ultrasound on LLE to rule out DVT. Pt and son notified. Pt currently resting with call bell in reach. Pt on continuous pulse ox. Continue to monitor.

## 2024-07-28 NOTE — Plan of Care (Signed)
  Problem: Health Behavior/Discharge Planning: Goal: Ability to manage health-related needs will improve Outcome: Progressing   Problem: Clinical Measurements: Goal: Ability to maintain clinical measurements within normal limits will improve Outcome: Adequate for Discharge Goal: Will remain free from infection Outcome: Progressing Goal: Diagnostic test results will improve Outcome: Adequate for Discharge Goal: Respiratory complications will improve Outcome: Progressing Goal: Cardiovascular complication will be avoided Outcome: Progressing   Problem: Activity: Goal: Risk for activity intolerance will decrease Outcome: Progressing   Problem: Nutrition: Goal: Adequate nutrition will be maintained Outcome: Completed/Met   Problem: Coping: Goal: Level of anxiety will decrease Outcome: Progressing   Problem: Elimination: Goal: Will not experience complications related to bowel motility Outcome: Progressing   Problem: Pain Managment: Goal: General experience of comfort will improve and/or be controlled Outcome: Progressing   Problem: Safety: Goal: Ability to remain free from injury will improve Outcome: Progressing   Problem: Skin Integrity: Goal: Risk for impaired skin integrity will decrease Outcome: Progressing   Problem: Education: Goal: Knowledge of the prescribed therapeutic regimen will improve Outcome: Progressing Goal: Understanding of discharge needs will improve Outcome: Progressing   Problem: Activity: Goal: Ability to avoid complications of mobility impairment will improve Outcome: Progressing Goal: Ability to tolerate increased activity will improve Outcome: Progressing   Problem: Clinical Measurements: Goal: Postoperative complications will be avoided or minimized Outcome: Progressing   Problem: Pain Management: Goal: Pain level will decrease with appropriate interventions Outcome: Progressing   Problem: Skin Integrity: Goal: Will show signs of  wound healing Outcome: Progressing

## 2024-07-28 NOTE — Progress Notes (Signed)
   07/28/24 1617  PT Visit Information  Last PT Received On 07/28/24  Assistance Needed +1  Reason Eval/Treat Not Completed Pain limiting ability to participate  History of Present Illness 73 yo female presents to therapy s/p R THA, anterior approach due to failure of conservative measures. Pt PMH includes but is not limited to: asthma, CKD, HTN, GERD, hep C, GAD, COPD, and angina.   Pt has increased pain in RLE and limited in tolerance, nursing present with pt. 2nd session deferred to tomorrow with hopefully improved tolerance and safety.   Stann, PT Acute Rehabilitation Services Office: [phone] 07/28/2024

## 2024-07-28 NOTE — Progress Notes (Signed)
 Subjective: 2 Days Post-Op Procedure(s) (LRB): ARTHROPLASTY, HIP, TOTAL, ANTERIOR APPROACH (Right)  Patient is feeling better today. She is hoping to go home today.  Activity level:  wbat Diet tolerance:  ok Voiding:  ok Patient reports pain as mild.    Objective: Vital signs in last 24 hours: Temp:  [98.2 F (36.8 C)-99.4 F (37.4 C)] 99 F (37.2 C) (08/21 0554) Pulse Rate:  [61-75] 75 (08/21 0554) Resp:  [16-18] 17 (08/21 0554) BP: (94-118)/(46-69) 118/69 (08/21 0554) SpO2:  [90 %-97 %] 94 % (08/21 0554)  Labs: No results for input(s): HGB in the last 72 hours. No results for input(s): WBC, RBC, HCT, PLT in the last 72 hours. No results for input(s): NA, K, CL, CO2, BUN, CREATININE, GLUCOSE, CALCIUM  in the last 72 hours. No results for input(s): LABPT, INR in the last 72 hours.  Physical Exam:  Neurologically intact ABD soft Neurovascular intact Sensation intact distally Intact pulses distally Dorsiflexion/Plantar flexion intact Incision: dressing C/D/I and no drainage No cellulitis present Compartment soft  Assessment/Plan:  2 Days Post-Op Procedure(s) (LRB): ARTHROPLASTY, HIP, TOTAL, ANTERIOR APPROACH (Right) Advance diet Up with therapy D/C IV fluids D/c home today. She will start outpatient PT as scheduled. Follow up in office 2 weeks post op. Conitnue on 81mg  asa BID for DVT prevention.    Prentice Mt Kaina Orengo 07/28/2024, 7:07 AM

## 2024-07-28 NOTE — Progress Notes (Signed)
 Physical Therapy Treatment Patient Details Name: Barbara [name] MRN: [id number] DOB: July 31, 1951 Today's Date: 07/28/2024   History of Present Illness 73 yo female presents to therapy s/p R THA, anterior approach due to failure of conservative measures. Pt PMH includes but is not limited to: asthma, CKD, HTN, GERD, hep C, GAD, COPD, and angina.    PT Comments  Pt in bed, agreeable to session, nursing provides pain meds prior to session. She is able to complete supine to sit at [facility] with HHA to scoot to EOB. Min A Sit to Stand due to increased trunk flexion and delayed hand placement on walker, able to transfer slowly to [facility] to void bladder. She is able to complete 38ft at min A with slow pacing and small strides, dec WB tolerance on RLE. Pt c/o LLE pain and stiffness in the popliteal space, provided quad sets to assist in mobility tolerance. Stairs deferred for safety this session, will assess pt function in PM session and trial as indicated.     If plan is discharge home, recommend the following: A little help with walking and/or transfers;A little help with bathing/dressing/bathroom;Assistance with cooking/housework;Assist for transportation;Help with stairs or ramp for entrance   Can travel by private vehicle        Equipment Recommendations  None recommended by PT    Recommendations for Other Services       Precautions / Restrictions Precautions Precautions: Fall Recall of Precautions/Restrictions: Intact Restrictions Weight Bearing Restrictions Per Provider Order: Yes RLE Weight Bearing Per Provider Order: Weight bearing as tolerated Other Position/Activity Restrictions: Ant. Hip, no Precautions     Mobility  Bed Mobility Overal bed mobility: Needs Assistance Bed Mobility: Supine to Sit     Supine to sit: HOB elevated, Used rails, Contact guard     General bed mobility comments: assist for scooting, HHA    Transfers Overall transfer level: Needs assistance Equipment  used: Rolling walker (2 wheels) Transfers: Sit to/from Stand Sit to Stand: Min assist   Step pivot transfers: Min assist       General transfer comment: Pt has increased trunk flexion during STS, spends inc time in position before bringing hands to walker    Ambulation/Gait Ambulation/Gait assistance: Min assist Gait Distance (Feet): 30 Feet Assistive device: Rolling walker (2 wheels) Gait Pattern/deviations: Step-to pattern, Decreased stride length, Decreased weight shift to right, Antalgic Gait velocity: decreased     General Gait Details: No increase in nausea during gait training   Stairs             Wheelchair Mobility     Tilt Bed    Modified Rankin (Stroke Patients Only)       Balance Overall balance assessment: Needs assistance Sitting-balance support: Feet supported Sitting balance-Leahy Scale: Good     Standing balance support: Bilateral upper extremity supported, During functional activity, Reliant on assistive device for balance Standing balance-Leahy Scale: Fair Standing balance comment: Fair with static standing at [employer] Communication: No apparent difficulties  Cognition Arousal: Alert Behavior During Therapy: WFL for tasks assessed/performed   PT - Cognitive impairments: No apparent impairments                         Following commands: Intact      Cueing Cueing Techniques: Verbal cues  Exercises  Total Joint Exercises Quad Sets: AROM, Both, 10 reps, Seated    General Comments        Pertinent Vitals/Pain Pain Assessment Pain Assessment: Faces Faces Pain Scale: Hurts even more Pain Location: R hip and LE, posterior L knee Pain Descriptors / Indicators: Aching, Constant, Discomfort, Grimacing, Dull, Operative site guarding Pain Intervention(s): Limited activity within patient's tolerance, Premedicated before session, Repositioned, Monitored during  session    Home Living                          Prior Function            PT Goals (current goals can now be found in the care plan section) Acute Rehab PT Goals Patient Stated Goal: to be able to walk no pain PT Goal Formulation: With patient Time For Goal Achievement: 08/16/24 Potential to Achieve Goals: Good Progress towards PT goals: Progressing toward goals    Frequency    7X/week      PT Plan      Co-evaluation              AM-PAC PT 6 Clicks Mobility   Outcome Measure  Help needed turning from your back to your side while in a flat bed without using bedrails?: A Little Help needed moving from lying on your back to sitting on the side of a flat bed without using bedrails?: A Little Help needed moving to and from a bed to a chair (including a wheelchair)?: A Little Help needed standing up from a chair using your arms (e.g., wheelchair or bedside chair)?: A Little Help needed to walk in hospital room?: A Little Help needed climbing 3-5 steps with a railing? : Total 6 Click Score: 16    End of Session Equipment Utilized During Treatment: Gait belt Activity Tolerance: Patient limited by fatigue;Patient limited by pain Patient left: in chair;with call bell/phone within reach;with chair alarm set;with family/visitor present Nurse Communication: Mobility status PT Visit Diagnosis: Unsteadiness on feet (R26.81);Other abnormalities of gait and mobility (R26.89);Muscle weakness (generalized) (M62.81);Difficulty in walking, not elsewhere classified (R26.2);Pain Pain - Right/Left: Right Pain - part of body: Hip;Leg     Time: 8785-8756 PT Time Calculation (min) (ACUTE ONLY): 29 min  Charges:    $Gait Training: 8-22 mins $Therapeutic Activity: 8-22 mins PT General Charges $$ ACUTE PT VISIT: 1 Visit                     Stann, PT Acute Rehabilitation Services Office: [phone] 07/28/2024    Stann Barbara [name] 07/28/2024, 1:25 PM

## 2024-07-29 ENCOUNTER — Observation Stay (HOSPITAL_COMMUNITY)

## 2024-07-29 DIAGNOSIS — M25462 Effusion, left knee: Secondary | ICD-10-CM | POA: Diagnosis present

## 2024-07-29 DIAGNOSIS — M8580 Other specified disorders of bone density and structure, unspecified site: Secondary | ICD-10-CM | POA: Diagnosis present

## 2024-07-29 DIAGNOSIS — I129 Hypertensive chronic kidney disease with stage 1 through stage 4 chronic kidney disease, or unspecified chronic kidney disease: Secondary | ICD-10-CM | POA: Diagnosis present

## 2024-07-29 DIAGNOSIS — Z841 Family history of disorders of kidney and ureter: Secondary | ICD-10-CM | POA: Diagnosis not present

## 2024-07-29 DIAGNOSIS — N1831 Chronic kidney disease, stage 3a: Secondary | ICD-10-CM | POA: Diagnosis present

## 2024-07-29 DIAGNOSIS — Z860100 Personal history of colon polyps, unspecified: Secondary | ICD-10-CM | POA: Diagnosis not present

## 2024-07-29 DIAGNOSIS — Z818 Family history of other mental and behavioral disorders: Secondary | ICD-10-CM | POA: Diagnosis not present

## 2024-07-29 DIAGNOSIS — Z825 Family history of asthma and other chronic lower respiratory diseases: Secondary | ICD-10-CM | POA: Diagnosis not present

## 2024-07-29 DIAGNOSIS — M1611 Unilateral primary osteoarthritis, right hip: Secondary | ICD-10-CM | POA: Diagnosis present

## 2024-07-29 DIAGNOSIS — Z7951 Long term (current) use of inhaled steroids: Secondary | ICD-10-CM | POA: Diagnosis not present

## 2024-07-29 DIAGNOSIS — Z83511 Family history of glaucoma: Secondary | ICD-10-CM | POA: Diagnosis not present

## 2024-07-29 DIAGNOSIS — Z821 Family history of blindness and visual loss: Secondary | ICD-10-CM | POA: Diagnosis not present

## 2024-07-29 DIAGNOSIS — Z96641 Presence of right artificial hip joint: Secondary | ICD-10-CM | POA: Diagnosis present

## 2024-07-29 DIAGNOSIS — R110 Nausea: Secondary | ICD-10-CM | POA: Diagnosis present

## 2024-07-29 DIAGNOSIS — Z87891 Personal history of nicotine dependence: Secondary | ICD-10-CM | POA: Diagnosis not present

## 2024-07-29 DIAGNOSIS — M25562 Pain in left knee: Secondary | ICD-10-CM | POA: Diagnosis present

## 2024-07-29 DIAGNOSIS — M1610 Unilateral primary osteoarthritis, unspecified hip: Secondary | ICD-10-CM | POA: Diagnosis present

## 2024-07-29 DIAGNOSIS — M109 Gout, unspecified: Secondary | ICD-10-CM | POA: Diagnosis present

## 2024-07-29 DIAGNOSIS — F32A Depression, unspecified: Secondary | ICD-10-CM | POA: Diagnosis present

## 2024-07-29 DIAGNOSIS — F419 Anxiety disorder, unspecified: Secondary | ICD-10-CM | POA: Diagnosis present

## 2024-07-29 DIAGNOSIS — J4489 Other specified chronic obstructive pulmonary disease: Secondary | ICD-10-CM | POA: Diagnosis present

## 2024-07-29 DIAGNOSIS — Z9181 History of falling: Secondary | ICD-10-CM | POA: Diagnosis not present

## 2024-07-29 DIAGNOSIS — M79605 Pain in left leg: Secondary | ICD-10-CM | POA: Diagnosis not present

## 2024-07-29 DIAGNOSIS — B1920 Unspecified viral hepatitis C without hepatic coma: Secondary | ICD-10-CM | POA: Diagnosis present

## 2024-07-29 DIAGNOSIS — K219 Gastro-esophageal reflux disease without esophagitis: Secondary | ICD-10-CM | POA: Diagnosis present

## 2024-07-29 DIAGNOSIS — L930 Discoid lupus erythematosus: Secondary | ICD-10-CM | POA: Diagnosis present

## 2024-07-29 LAB — SYNOVIAL CELL COUNT + DIFF, W/ CRYSTALS
Crystals, Fluid: NONE SEEN
Eosinophils-Synovial: 0 % (ref 0–1)
Lymphocytes-Synovial Fld: 1 % (ref 0–20)
Monocyte-Macrophage-Synovial Fluid: 8 % — ABNORMAL LOW (ref 50–90)
Neutrophil, Synovial: 91 % — ABNORMAL HIGH (ref 0–25)
WBC, Synovial: 13120 /mm3 — ABNORMAL HIGH (ref 0–200)

## 2024-07-29 NOTE — Plan of Care (Signed)
  Problem: Safety: Goal: Ability to remain free from injury will improve Outcome: Progressing   Problem: Coping: Goal: Level of anxiety will decrease Outcome: Progressing

## 2024-07-29 NOTE — Plan of Care (Signed)

## 2024-07-29 NOTE — Progress Notes (Signed)
 Subjective: 3 Days Post-Op Procedure(s) (LRB): ARTHROPLASTY, HIP, TOTAL, ANTERIOR APPROACH (Right)  Patient complains of left knee pain and swelling. It started yesterday morning after rounding. She did not fall or injure her knee. She states that is it much worse than the hip pain she is having from surgery on the opposite leg.   Activity level:  wbat Diet tolerance:  ok Voiding:  ok Patient reports pain as mild.    Objective: Vital signs in last 24 hours: Temp:  [98.6 F (37 C)-98.9 F (37.2 C)] 98.6 F (37 C) (08/22 0530) Pulse Rate:  [70-84] 72 (08/22 0530) Resp:  [15-16] 16 (08/22 0530) BP: (100-112)/(58-83) 112/60 (08/22 0530) SpO2:  [88 %-99 %] 95 % (08/22 0530)  Labs: No results for input(s): HGB in the last 72 hours. No results for input(s): WBC, RBC, HCT, PLT in the last 72 hours. No results for input(s): NA, K, CL, CO2, BUN, CREATININE, GLUCOSE, CALCIUM  in the last 72 hours. No results for input(s): LABPT, INR in the last 72 hours.  Physical Exam:  Neurologically intact ABD soft Neurovascular intact Sensation intact distally Intact pulses distally Dorsiflexion/Plantar flexion intact Incision: dressing C/D/I and no drainage No cellulitis present Compartment soft Lef tknee has 1+effusion and limited ROM. She is tender to palpation throughout and has some pain in her left calf.  Assessment/Plan:  3 Days Post-Op Procedure(s) (LRB): ARTHROPLASTY, HIP, TOTAL, ANTERIOR APPROACH (Right) Advance diet I ordered a doppler US  yesterday to rule out DVT but it has not happened yet. It is scheduled to be done this morning. I will come back at lunch and likely aspirate and inject her left knee with cortisone depending on the doppler results. If her synovial fluid looks abnormal on the aspirate then we will send it off for analysis as she may be having a gout attack even though she has no history of gout.      Prentice Mt Thanh Mottern 07/29/2024, 8:25  AM

## 2024-07-29 NOTE — Progress Notes (Addendum)
 PT Cancellation Note  Patient Details Name: CHANNA HAZELETT MRN: [id number] DOB: June 26, 1951   Cancelled Treatment:     New onset of Left knee swelling, am dopplers negative, pt awaiting possible L knee aspiration around lunch time. Currently very lethargic and unable to tolerate PT session. Will plan to return this pm for progressive mobility as tolerated.  Re-attempted x2 in pm, pt receiving aspiration procedure to Left knee, and later with c/o fatigue and pain declining mobility. Pt encouraged to sit up in chair for dinner with nursing assist.   Darice JAYSON Bohr 07/29/2024, 11:21 AM

## 2024-07-29 NOTE — Progress Notes (Signed)
 VASCULAR LAB    Left lower extremity venous duplex has been performed.  See CV proc for preliminary results.   Warnell Rasnic, RVT 07/29/2024, 10:28 AM

## 2024-07-29 NOTE — Progress Notes (Signed)
 Aspiration/Injection Procedure Note Barbara [name] [id number] June 08, 1951  Procedure: Aspiration and Injection Indications: Left knee pain and swelling  Procedure Details Consent: Risks of procedure as well as the alternatives and risks of each were explained to the (patient/caregiver).  Consent for procedure obtained. Time Out: Verified patient identification, verified procedure, site/side was marked, verified correct patient position, special equipment/implants available, medications/allergies/relevent history reviewed, required imaging and test results available.  Performed   Local Anesthesia Used:Lidocaine  1% plain; 3mL Amount of Fluid Aspirated: 65mL Character of Fluid: straw colored Fluid was sent qnm:rzoo count, culture, and crystal identification Medication placed in joint: 2 cc betamethasone  A sterile dressing was applied.  Patient did tolerate procedure well. Estimated blood loss: 0mL  Barbara [name] 07/29/2024, 1:40 PM

## 2024-07-30 ENCOUNTER — Inpatient Hospital Stay (HOSPITAL_COMMUNITY)

## 2024-07-30 LAB — CBC
HCT: 33.5 % — ABNORMAL LOW (ref 36.0–46.0)
Hemoglobin: 10.3 g/dL — ABNORMAL LOW (ref 12.0–15.0)
MCH: 29.4 pg (ref 26.0–34.0)
MCHC: 30.7 g/dL (ref 30.0–36.0)
MCV: 95.7 fL (ref 80.0–100.0)
Platelets: 140 K/uL — ABNORMAL LOW (ref 150–400)
RBC: 3.5 MIL/uL — ABNORMAL LOW (ref 3.87–5.11)
RDW: 13.8 % (ref 11.5–15.5)
WBC: 9.3 K/uL (ref 4.0–10.5)
nRBC: 0 % (ref 0.0–0.2)

## 2024-07-30 LAB — URIC ACID: Uric Acid, Serum: 5.7 mg/dL (ref 2.5–7.1)

## 2024-07-30 NOTE — Plan of Care (Signed)
  Problem: Elimination: Goal: Will not experience complications related to bowel motility Outcome: Progressing   Problem: Pain Managment: Goal: General experience of comfort will improve and/or be controlled Outcome: Progressing   Problem: Safety: Goal: Ability to remain free from injury will improve Outcome: Progressing   Problem: Pain Management: Goal: Pain level will decrease with appropriate interventions Outcome: Progressing

## 2024-07-30 NOTE — Progress Notes (Signed)
     Barbara [name] is a 73 y.o. female   Orthopaedic diagnosis: Status post right anterior total hip arthroplasty 07/26/2024 Left knee pain and swelling  Subjective: Patient resting comfortably.  Pain controlled.  Knee pain and swelling on the left improved with aspiration and injection yesterday.  The concern primarily was for gout.  No crystals seen on Gram stain.  Cultures pending.  No bowel movement postoperatively.  Passing flatus.  No abdominal pain shortness of breath or chest pain.  Tolerating p.o. well.  Reports plan for dispo is discharged home with the help of family.  No documented fevers.  Objectyive: Vitals:   07/29/24 2209 07/30/24 0516  BP:  118/62  Pulse:  62  Resp:  16  Temp:  98.1 F (36.7 C)  SpO2: 96% 97%     Exam: Awake and alert Respirations even and unlabored No acute distress  Right hip with clean, dry, and intact dressing.  No significant swelling.  Tolerates logroll type maneuver of the lower extremity without significant pain.  She tolerates some flexion at the knee actively.  Calf is soft and nontender with SCDs in place.  Exposed skin is benign.  Sensation grossly intact distally.  Warm and well-perfused distally.  Left knee with no appreciable effusion today.  No warmth erythema.  Prior aspiration injection site is benign.  She says she is able to actively flex the knee bit better but still has some stiffness and pain to range of motion.  Does not seem to have any significant focal tenderness to palpation today.  Calf is soft and nontender with SCDs in place.  Exposed skin is benign.  Sensation grossly tact distally.  Warm and well perfused distally.  Assessment: Postop day 4 status post the above, doing well Left knee pain and swelling, improved with aspiration and injection.  No warmth erythema today.  Cultures pending.  No crystals evident on Gram stain   Plan: Patient with improvement from yesterday with aspiration and injection.  She still  having some knee pain and stiffness.  There is no outward appearance of infection today.  Right hip doing well.  Will order an x-ray of the left knee as well as a CBC and uric acid to see if we get some more information with a guard to the etiology.  Hopeful this will continue to improve.  She will work on ranging the knee and ambulate with physical therapy today.  We will see how things go today with regard to discharge.  Could consider a short oral corticosteroid taper at discharge if her knee continues to bother her and limit her recovery.  Aspirin  for DVT prophylaxis, Dilaudid  for postoperative pain, Phenergan  for nausea, and tizanidine  as a muscle relaxer have been sent to the patient's pharmacy for postoperative meds.  Weightbearing as tolerated bilateral lower extremities with walker.   Minyon Billiter J. [place], PA-C

## 2024-07-30 NOTE — Progress Notes (Signed)
 Physical Therapy Treatment Patient Details Name: Barbara [name] MRN: [id number] DOB: [birth year] Today's Date: 07/30/2024   History of Present Illness 73 yo female presents to therapy s/p R THA, anterior approach due to failure of conservative measures. Pt PMH includes but is not limited to: asthma, CKD, HTN, GERD, hep C, GAD, COPD, and angina.    PT Comments  Pt in bed, agreeable to session, states that she needs to use the bathroom, able to amb to the bathroom and uses 3 in 1 over toilet. She is able to stand and wash her hands before progressing to amb and stair training. Continues to show progress with level surface mobility tolerance. During stairs requires mod A and heavy HR use to complete, cued for sequencing and uses RLE lead when ascending and RLE lead descending.     If plan is discharge home, recommend the following: A little help with walking and/or transfers;A little help with bathing/dressing/bathroom;Assistance with cooking/housework;Assist for transportation;Help with stairs or ramp for entrance   Can travel by private vehicle        Equipment Recommendations  None recommended by PT    Recommendations for Other Services       Precautions / Restrictions Precautions Precautions: Fall Recall of Precautions/Restrictions: Intact Restrictions Weight Bearing Restrictions Per Provider Order: Yes RLE Weight Bearing Per Provider Order: Weight bearing as tolerated Other Position/Activity Restrictions: Ant. Hip, no Precautions     Mobility  Bed Mobility Overal bed mobility: Modified Independent Bed Mobility: Supine to Sit, Sit to Supine     Supine to sit: HOB elevated, Used rails, Modified independent (Device/Increase time) Sit to supine: Modified independent (Device/Increase time)   General bed mobility comments: increased time    Transfers Overall transfer level: Needs assistance Equipment used: Rolling walker (2 wheels) Transfers: Sit to/from Stand Sit to Stand:  Supervision           General transfer comment: improved tolerance to STS, completed from bed and toilet (with riser) without physical assist required    Ambulation/Gait Ambulation/Gait assistance: Contact guard assist Gait Distance (Feet): 55 Feet Assistive device: Rolling walker (2 wheels) Gait Pattern/deviations: Decreased stride length, Decreased weight shift to right, Antalgic, Step-through pattern Gait velocity: decreased     General Gait Details: Pt able to complete step through patterm although LLE does not pass RLE stance limb. Progresses to stair training.   Stairs Stairs: Yes Stairs assistance: Mod assist Stair Management: One rail Right, Step to pattern Number of Stairs: 4 General stair comments: Pt completes with decreased ability to lead with LLE, uses RLE lead and requires mod A for steps with heavy use of the handrail. Pt requires LLE assist to descend initial step. Unsafe without increased level of assistance and cues.   Wheelchair Mobility     Tilt Bed    Modified Rankin (Stroke Patients Only)       Balance Overall balance assessment: Needs assistance Sitting-balance support: Feet supported Sitting balance-Leahy Scale: Good     Standing balance support: During functional activity, Reliant on assistive device for balance Standing balance-Leahy Scale: Good                              Communication Communication Communication: No apparent difficulties  Cognition Arousal: Alert Behavior During Therapy: WFL for tasks assessed/performed   PT - Cognitive impairments: No apparent impairments  Following commands: Intact      Cueing Cueing Techniques: Verbal cues  Exercises      General Comments        Pertinent Vitals/Pain Pain Assessment Pain Assessment: 0-10 Pain Score: 6  Faces Pain Scale: Hurts little more Pain Location: R hip Pain Descriptors / Indicators: Aching, Constant,  Discomfort, Grimacing, Dull, Operative site guarding Pain Intervention(s): Monitored during session    Home Living                          Prior Function            PT Goals (current goals can now be found in the care plan section) Acute Rehab PT Goals Patient Stated Goal: to be able to walk no pain PT Goal Formulation: With patient Time For Goal Achievement: 08/16/24 Potential to Achieve Goals: Good Progress towards PT goals: Progressing toward goals    Frequency    7X/week      PT Plan      Co-evaluation              AM-PAC PT 6 Clicks Mobility   Outcome Measure  Help needed turning from your back to your side while in a flat bed without using bedrails?: A Little Help needed moving from lying on your back to sitting on the side of a flat bed without using bedrails?: A Little Help needed moving to and from a bed to a chair (including a wheelchair)?: A Little Help needed standing up from a chair using your arms (e.g., wheelchair or bedside chair)?: None Help needed to walk in hospital room?: A Little Help needed climbing 3-5 steps with a railing? : A Lot 6 Click Score: 18    End of Session Equipment Utilized During Treatment: Gait belt Activity Tolerance: Patient tolerated treatment well;Patient limited by fatigue Patient left: with call bell/phone within reach;in bed;with bed alarm set Nurse Communication: Mobility status PT Visit Diagnosis: Unsteadiness on feet (R26.81);Other abnormalities of gait and mobility (R26.89);Muscle weakness (generalized) (M62.81);Difficulty in walking, not elsewhere classified (R26.2);Pain Pain - Right/Left: Right Pain - part of body: Hip;Leg     Time: 1446-1510 PT Time Calculation (min) (ACUTE ONLY): 24 min  Charges:    $Gait Training: 23-37 mins PT General Charges $$ ACUTE PT VISIT: 1 Visit                     Stann, PT Acute Rehabilitation Services Office: [phone] 07/30/2024    [name] 07/30/2024, 3:34 PM

## 2024-07-30 NOTE — Progress Notes (Signed)
 Physical Therapy Treatment Patient Details Name: Barbara [name] MRN: [id number] DOB: 25-Apr-1951 Today's Date: 07/30/2024   History of Present Illness 73 yo female presents to therapy s/p R THA, anterior approach due to failure of conservative measures. Pt PMH includes but is not limited to: asthma, CKD, HTN, GERD, hep C, GAD, COPD, and angina.    PT Comments  Pt in bed, premedicated and agreeable to session. She is able to come to sit EOB with use of HR, no physical assist. Sit to Stand completed into RW with improving tolerance and rate of transfer. Initiates steps with step to pattern progressing to early step through, shortened stride LLE. She is able to complete 128ft and reports R hip fatigue, returns to bed. Will trial stairs in PM session.     If plan is discharge home, recommend the following: A little help with walking and/or transfers;A little help with bathing/dressing/bathroom;Assistance with cooking/housework;Assist for transportation;Help with stairs or ramp for entrance   Can travel by private vehicle        Equipment Recommendations  None recommended by PT    Recommendations for Other Services       Precautions / Restrictions Precautions Precautions: Fall Restrictions Weight Bearing Restrictions Per Provider Order: Yes RLE Weight Bearing Per Provider Order: Weight bearing as tolerated Other Position/Activity Restrictions: Ant. Hip, no Precautions     Mobility  Bed Mobility Overal bed mobility: Modified Independent Bed Mobility: Supine to Sit, Sit to Supine     Supine to sit: HOB elevated, Used rails, Modified independent (Device/Increase time) Sit to supine: Modified independent (Device/Increase time)   General bed mobility comments: increased time    Transfers Overall transfer level: Needs assistance Equipment used: Rolling walker (2 wheels) Transfers: Sit to/from Stand Sit to Stand: Contact guard assist           General transfer comment: Pt has  increased trunk flexion during STS, improving tolerance and rate of transfer vs prior sessions    Ambulation/Gait Ambulation/Gait assistance: Contact guard assist Gait Distance (Feet): 100 Feet Assistive device: Rolling walker (2 wheels) Gait Pattern/deviations: Decreased stride length, Decreased weight shift to right, Antalgic, Step-through pattern, Step-to pattern Gait velocity: decreased     General Gait Details: step to pattern progresses to early step through with improved WB tolerance bilaterally, some discontinuation between steps, R hip fatigues with this distance   Stairs             Wheelchair Mobility     Tilt Bed    Modified Rankin (Stroke Patients Only)       Balance Overall balance assessment: Needs assistance Sitting-balance support: Feet supported Sitting balance-Leahy Scale: Good     Standing balance support: During functional activity, Reliant on assistive device for balance                                Communication Communication Communication: No apparent difficulties  Cognition Arousal: Alert Behavior During Therapy: WFL for tasks assessed/performed   PT - Cognitive impairments: No apparent impairments                         Following commands: Intact      Cueing Cueing Techniques: Verbal cues  Exercises      General Comments        Pertinent Vitals/Pain Pain Assessment Pain Assessment: Faces Faces Pain Scale: Hurts little more Pain Location: R hip Pain  Descriptors / Indicators: Aching, Constant, Discomfort, Grimacing, Dull, Operative site guarding Pain Intervention(s): Limited activity within patient's tolerance, Monitored during session, Premedicated before session    Home Living                          Prior Function            PT Goals (current goals can now be found in the care plan section) Acute Rehab PT Goals Patient Stated Goal: to be able to walk no pain PT Goal  Formulation: With patient Time For Goal Achievement: 08/16/24 Potential to Achieve Goals: Good Progress towards PT goals: Progressing toward goals    Frequency    7X/week      PT Plan      Co-evaluation              AM-PAC PT 6 Clicks Mobility   Outcome Measure  Help needed turning from your back to your side while in a flat bed without using bedrails?: A Little Help needed moving from lying on your back to sitting on the side of a flat bed without using bedrails?: A Little Help needed moving to and from a bed to a chair (including a wheelchair)?: A Little Help needed standing up from a chair using your arms (e.g., wheelchair or bedside chair)?: A Little Help needed to walk in hospital room?: A Little Help needed climbing 3-5 steps with a railing? : A Lot 6 Click Score: 17    End of Session Equipment Utilized During Treatment: Gait belt Activity Tolerance: Patient tolerated treatment well;Patient limited by fatigue Patient left: with call bell/phone within reach;in bed;with bed alarm set Nurse Communication: Mobility status PT Visit Diagnosis: Unsteadiness on feet (R26.81);Other abnormalities of gait and mobility (R26.89);Muscle weakness (generalized) (M62.81);Difficulty in walking, not elsewhere classified (R26.2);Pain Pain - Right/Left: Right Pain - part of body: Hip;Leg     Time: 1207-1226 PT Time Calculation (min) (ACUTE ONLY): 19 min  Charges:    $Gait Training: 8-22 mins PT General Charges $$ ACUTE PT VISIT: 1 Visit                     Barbara, PT Acute Rehabilitation Services Office: [phone] 07/30/2024    Barbara [name] 07/30/2024, 12:36 PM

## 2024-07-31 MED ORDER — DOCUSATE SODIUM 100 MG PO CAPS: 100.0000 mg | ORAL_CAPSULE | Freq: Two times a day (BID) | ORAL | 0 refills | Status: AC

## 2024-07-31 MED ORDER — POLYETHYLENE GLYCOL 3350 17 G PO PACK: 17.0000 g | PACK | Freq: Every day | ORAL | 0 refills | Status: AC

## 2024-07-31 NOTE — Progress Notes (Signed)
Patient discharged to home w/ family. Given all belongings, instructions. Verbalized understanding of instructions. Escorted to pov via w/c. 

## 2024-07-31 NOTE — Progress Notes (Signed)
     Barbara [name] is a 73 y.o. female   Orthopaedic diagnosis: Status post right anterior total hip arthroplasty 07/26/2024 Left knee pain and swelling  Subjective: Patient resting comfortably.  Pain controlled on current regimen.  Left knee pain continues to improve following aspiration/injection.  Serum uric acid normal.  No documented fevers.  No bowel movement yet postoperatively.  No abdominal pain shortness of breath or chest pain.  Tolerating p.o. well.  Hopeful for discharge home today after working with physical therapy again today.  Mobilization seems to continue to improve.  Radiographs of the left knee revealing degenerative changes.  Objectyive: Vitals:   07/30/24 2142 07/31/24 0459  BP: 109/66 122/74  Pulse: 63 (!) 53  Resp: 18 18  Temp: 98.2 F (36.8 C) 98.2 F (36.8 C)  SpO2: 95% 92%     Exam: Awake and alert Respirations even and unlabored No acute distress   Right hip with clean, dry, and intact dressing.  No significant swelling.  Tolerates logroll type maneuver of the lower extremity without significant pain.  She tolerates some flexion at the knee actively.  Calf is soft and nontender with SCDs in place.  Exposed skin is benign.  Sensation grossly intact distally.  Warm and well-perfused distally.   Left knee with no appreciable effusion today.  No warmth erythema.  Prior aspiration injection site is benign.  Left knee ranges better today both passively and actively.  Does not seem to have any significant focal tenderness to palpation today.  Calf is soft and nontender with SCDs in place.  Exposed skin is benign.  Sensation grossly tact distally.  Warm and well perfused distally.  Assessment: Postop day 5 status post above, doing well Left knee pain and swelling, likely secondary to underlying degenerative changes, improved with aspiration and injection  Plan: Patient continues to improve and make progress.  She will work with physical therapy today and will  hopefully get her home to the help of family members this afternoon.  He is not have any constipation type symptoms but has not had a bowel movement yet postoperatively.  She does not seem too concerned about this but I will send her home with MiraLAX  as well as have her continue stool softener.    Aspirin  for DVT prophylaxis, Dilaudid  for postoperative pain, Phenergan  for nausea, and tizanidine  as a muscle relaxer have been sent to the patient's pharmacy for postoperative meds.   Weightbearing as tolerated bilateral lower extremities with walker. She is scheduled for outpatient physical therapy in our office tomorrow.  Follow-up with Dr. Dalldorf 08/05/2024.   Besan Ketchem J. [place], PA-C

## 2024-07-31 NOTE — Discharge Summary (Signed)
 Patient ID: MUSKAAN SMET MRN: [id number] DOB/AGE: 75-Oct-1952 75 y.o.  Admit date: 07/26/2024 Discharge date: 07/31/2024  Admission Diagnoses:  Principal Problem:   Primary localized osteoarthrosis of right hip Active Problems:   S/P total right hip arthroplasty   Discharge Diagnoses:  Same  Past Medical History:  Diagnosis Date   Arthritis    right knee, neck   Asthma    Chronic kidney disease    COPD (chronic obstructive pulmonary disease) (HCC)    Dyspnea    Hypertension    Mouth trouble    infection from tooth that was pulled approx. mid Mar. 2013    Surgeries: Procedure(s): ARTHROPLASTY, HIP, TOTAL, ANTERIOR APPROACH on 07/26/2024   Consultants:   Discharged Condition: Improved  Hospital Course: Sruthi M Clements is an 73 y.o. female who was admitted 07/26/2024 for operative treatment ofPrimary localized osteoarthrosis of right hip. Patient has severe unremitting pain that affects sleep, daily activities, and work/hobbies. After pre-op clearance the patient was taken to the operating room on 07/26/2024 and underwent  Procedure(s): ARTHROPLASTY, HIP, TOTAL, ANTERIOR APPROACH.    Patient was given perioperative antibiotics:  Anti-infectives (From admission, onward)    Start     Dose/Rate Route Frequency Ordered Stop   07/26/24 1700  ceFAZolin  (ANCEF ) IVPB 2g/100 mL premix        2 g 200 mL/hr over 30 Minutes Intravenous Every 6 hours 07/26/24 1330 07/27/24 0200   07/26/24 0830  ceFAZolin  (ANCEF ) IVPB 2g/100 mL premix        2 g 200 mL/hr over 30 Minutes Intravenous On call to O.R. 07/26/24 0817 07/26/24 1043        Patient was given sequential compression devices, early ambulation, and chemoprophylaxis to prevent DVT.  Inpatient Morphine  Milligram Equivalents Per Day 8/19 - 8/24   Values displayed are in units of MME/Day    Order Start / End Date 8/19 8/20 8/21 8/22 Yesterday Today    oxyCODONE  (Oxy IR/ROXICODONE ) immediate release tablet 5 mg 8/19 - 8/19 7.5 of  Unknown -- -- -- -- --    oxyCODONE  (ROXICODONE ) 5 MG/5ML solution 5 mg 8/19 - 8/19 0 of Unknown -- -- -- -- --      Group total: 7.5 of Unknown         fentaNYL  (SUBLIMAZE ) injection 8/19 - 8/19 *45 of 45 -- -- -- -- --    HYDROmorphone  (DILAUDID ) injection 8/19 - 8/19 *40 of 40 -- -- -- -- --    HYDROmorphone  (DILAUDID ) injection 0.5-1 mg 8/19 - No end date 0 of 30-60 20 of 60-120 20 of 60-120 0 of 60-120 0 of 60-120 0 of 60-120    HYDROmorphone  (DILAUDID ) tablet 1-2 mg 8/19 - No end date 0 of 12-24 16 of 24-48 24 of 24-48 0 of 24-48 24 of 24-48 0 of 24-48    HYDROmorphone  (DILAUDID ) tablet 2-3 mg 8/19 - No end date 8 of 24-36 16 of 48-72 8 of 48-72 24 of 48-72 0 of 48-72 16 of 48-72    HYDROmorphone  (DILAUDID ) injection 0.25-0.5 mg 8/19 - 8/19 30 of 40-80 -- -- -- -- --    Daily Totals  * 130.5 of Unknown (at least 191-285) 52 of 132-240 52 of 132-240 24 of 132-240 24 of 132-240 16 of 132-240  *One-Step medication  Calculation Errors     Order Type Date Details   oxyCODONE  (Oxy IR/ROXICODONE ) immediate release tablet 5 mg Ordered Dose -- Insufficient frequency information   oxyCODONE  (ROXICODONE ) 5 MG/5ML  solution 5 mg Ordered Dose -- Insufficient frequency information            Patient benefited maximally from hospital stay and there were no complications.    Recent vital signs: Patient Vitals for the past 24 hrs:  BP Temp Temp src Pulse Resp SpO2  07/31/24 0459 122/74 98.2 F (36.8 C) -- (!) 53 18 92 %  07/30/24 2142 109/66 98.2 F (36.8 C) Oral 63 18 95 %  07/30/24 1325 (!) 108/59 99.5 F (37.5 C) Oral 63 17 92 %     Recent laboratory studies:  Recent Labs    07/30/24 0937  WBC 9.3  HGB 10.3*  HCT 33.5*  PLT 140*     Discharge Medications:   Allergies as of 07/31/2024       Reactions   Nsaids Shortness Of Breath   Clonidine Derivatives    Coughing shortness of breath   Codeine Other (See Comments)   Reaction: hallucination    Ibuprofen    Other  reaction(s): NSAIDS took breath away   Lisinopril Hives   Penicillins Hives   Vicodin [hydrocodone -acetaminophen ] Nausea And Vomiting   Procaine Rash        Medication List     TAKE these medications    acetaminophen  500 MG tablet Commonly known as: TYLENOL  Take 1,500 mg by mouth in the morning, at noon, and at bedtime.   albuterol  108 (90 Base) MCG/ACT inhaler Commonly known as: VENTOLIN  HFA Inhale 2 puffs into the lungs every 6 (six) hours as needed for wheezing or shortness of breath. What changed: Another medication with the same name was changed. Make sure you understand how and when to take each.   albuterol  (2.5 MG/3ML) 0.083% nebulizer solution Commonly known as: PROVENTIL  Take 3 mLs (2.5 mg total) by nebulization every 6 (six) hours as needed. Breathing What changed:  reasons to take this additional instructions   amLODipine  10 MG tablet Commonly known as: NORVASC  Take 10 mg by mouth in the morning.   aspirin  EC 81 MG tablet Take 1 tablet (81 mg total) by mouth 2 (two) times daily.   atorvastatin  10 MG tablet Commonly known as: LIPITOR Take 10 mg by mouth in the morning.   azelastine  0.1 % nasal spray Commonly known as: ASTELIN  Place 1 spray into both nostrils 2 (two) times daily. Use in each nostril as directed   CALCIUM  1200 PO Take 1 tablet by mouth daily with lunch.   carboxymethylcellulose 0.5 % Soln Commonly known as: REFRESH PLUS Place 1 drop into both eyes in the morning and at bedtime.   Cod Liver Oil Caps Take 1 capsule by mouth daily with lunch.   diphenhydrAMINE  25 MG tablet Commonly known as: BENADRYL  Take 25 mg by mouth in the morning.   docusate sodium  100 MG capsule Commonly known as: Colace Take 1 capsule (100 mg total) by mouth 2 (two) times daily for 15 days.   Dupixent  300 MG/2ML Soaj Generic drug: Dupilumab  Inject 300 mg into the skin every 14 (fourteen) days. **appointment needed for further refills**   Fish Oil 1200  MG Caps Take 1,200 mg by mouth daily with lunch.   fluticasone -salmeterol 500-50 MCG/ACT Aepb Commonly known as: ADVAIR  INHALE 1 PUFF INTO THE LUNGS IN THE MORNING AND AT BEDTIME.   hydrochlorothiazide  25 MG tablet Commonly known as: HYDRODIURIL  Take 25 mg by mouth in the morning.   HYDROmorphone  2 MG tablet Commonly known as: Dilaudid  Take 1 tablet (2 mg total) by mouth  every 6 (six) hours as needed for up to 10 days for severe pain (pain score 7-10) or moderate pain (pain score 4-6) (post op pain).   levocetirizine 5 MG tablet Commonly known as: XYZAL  Take 1 tablet (5 mg total) by mouth every evening.   montelukast  10 MG tablet Commonly known as: SINGULAIR  TAKE 1 TABLET AT BEDTIME   pantoprazole  40 MG tablet Commonly known as: PROTONIX  Take 1 tablet (40 mg total) by mouth daily.   polyethylene glycol 17 g packet Commonly known as: MiraLax  Take 17 g by mouth daily. For constipation   promethazine  12.5 MG tablet Commonly known as: PHENERGAN  Take 1-2 tablets (12.5-25 mg total) by mouth every 6 (six) hours as needed for nausea or vomiting.   Restasis  0.05 % ophthalmic emulsion Generic drug: cycloSPORINE  Place 1 drop into both eyes 2 (two) times daily.   Spiriva  Respimat 2.5 MCG/ACT Aers Generic drug: Tiotropium Bromide  Monohydrate Inhale 2 puffs into the lungs every evening.   tiZANidine  4 MG tablet Commonly known as: Zanaflex  Take 1 tablet (4 mg total) by mouth every 6 (six) hours as needed for muscle spasms.   traMADol  50 MG tablet Commonly known as: ULTRAM  Take 50 mg by mouth at bedtime as needed for moderate pain (pain score 4-6).   TURMERIC CURCUMIN PO Take 1 capsule by mouth daily with lunch.   VITAMIN B 12 PO Take 2,500 mcg by mouth daily with lunch.   Vitamin D3 50 MCG (2000 UT) Tabs Take 2,000 Units by mouth daily with lunch.               Durable Medical Equipment  (From admission, onward)           Start     Ordered   07/26/24 1536   DME 3 n 1  Once        07/26/24 1535   07/26/24 1536  DME Bedside commode  Once       Question:  Patient needs a bedside commode to treat with the following condition  Answer:  Primary osteoarthritis of right hip   07/26/24 1535   07/26/24 1307  DME Walker rolling  Once       Question:  Patient needs a walker to treat with the following condition  Answer:  Primary osteoarthritis of right hip   07/26/24 1306            Diagnostic Studies: DG Knee 3 Views Left Result Date: 07/30/2024 CLINICAL DATA:  Left knee pain. Technologist notes state left knee was aspirated. EXAM: LEFT KNEE - 3 VIEW COMPARISON:  Radiograph 01/28/2017 FINDINGS: Lateral tibiofemoral joint space narrowing. Mild to moderate tricompartmental peripheral spurring. No fracture, erosion, or focal bone abnormality. Small joint effusion. Question ossific intra-articular body within the lateral suprapatellar space. No erosive change. Unremarkable soft tissues. IMPRESSION: 1. Mild to moderate tricompartmental osteoarthritis. 2. Small joint effusion. Question ossific intra-articular body within the lateral suprapatellar space. Electronically Signed   By: Andrea Gasman M.D.   On: 07/30/2024 10:28   VAS US  LOWER EXTREMITY VENOUS (DVT) Result Date: 07/30/2024  Lower Venous DVT Study Patient Name:  SHIR BERGMAN  Date of Exam:   07/29/2024 Medical Rec #: [id number]        Accession #:    [id number] Date of Birth: 1951/08/06        Patient Gender: F Patient Age:   39 years Exam Location:  [facility] Procedure:      VAS US  LOWER EXTREMITY VENOUS (  DVT) Referring Phys: PRENTICE NIDA --------------------------------------------------------------------------------  Indications: Status post right total hip arthroplasty 07/26/2024. Now with left lower extremity/knee pain with large anterior lateral effusion. No pain or effusion of left leg prior to right hip surgery.  Comparison Study: No prior study on file Performing Technologist: Alberta Lis RVS  Examination Guidelines: A complete evaluation includes B-mode imaging, spectral Doppler, color Doppler, and power Doppler as needed of all accessible portions of each vessel. Bilateral testing is considered an integral part of a complete examination. Limited examinations for reoccurring indications may be performed as noted. The reflux portion of the exam is performed with the patient in reverse Trendelenburg.  +-----+---------------+---------+-----------+----------+--------------+ RIGHTCompressibilityPhasicitySpontaneityPropertiesThrombus Aging +-----+---------------+---------+-----------+----------+--------------+ CFV  Full           Yes      Yes                                 +-----+---------------+---------+-----------+----------+--------------+ SFJ  Full                                                        +-----+---------------+---------+-----------+----------+--------------+   +---------+---------------+---------+-----------+----------+--------------+ LEFT     CompressibilityPhasicitySpontaneityPropertiesThrombus Aging +---------+---------------+---------+-----------+----------+--------------+ CFV      Full           Yes      Yes                                 +---------+---------------+---------+-----------+----------+--------------+ SFJ      Full                                                        +---------+---------------+---------+-----------+----------+--------------+ FV Prox  Full           Yes      Yes                                 +---------+---------------+---------+-----------+----------+--------------+ FV Mid   Full                                                        +---------+---------------+---------+-----------+----------+--------------+ FV DistalFull           Yes      Yes                                 +---------+---------------+---------+-----------+----------+--------------+ PFV      Full            Yes      Yes                                 +---------+---------------+---------+-----------+----------+--------------+ POP      Full  Yes      Yes                                 +---------+---------------+---------+-----------+----------+--------------+ PTV      Full                                                        +---------+---------------+---------+-----------+----------+--------------+ PERO     Full                                                        +---------+---------------+---------+-----------+----------+--------------+ Gastroc  Full                                                        +---------+---------------+---------+-----------+----------+--------------+    Summary: RIGHT: - No evidence of common femoral vein obstruction.   LEFT: - There is no evidence of deep vein thrombosis in the lower extremity.  - No cystic structure found in the popliteal fossa.  *See table(s) above for measurements and observations. Electronically signed by Gaile New MD on 07/30/2024 at 1:49:12 AM.    Final    DG HIP UNILAT WITH PELVIS 1V RIGHT Result Date: 07/26/2024 CLINICAL DATA:  Elective surgery. EXAM: DG HIP (WITH OR WITHOUT PELVIS) 1V RIGHT COMPARISON:  None Available. FINDINGS: Two fluoroscopic spot views of the pelvis and right hip obtained in the operating room. Scratch images during hip arthroplasty. Fluoroscopy time 15 seconds. Dose 2.4286 mGy. IMPRESSION: Intraoperative fluoroscopy during right hip arthroplasty. Electronically Signed   By: Andrea Gasman M.D.   On: 07/26/2024 11:59   DG C-Arm 1-60 Min-No Report Result Date: 07/26/2024 Fluoroscopy was utilized by the requesting physician.  No radiographic interpretation.   DG Chest 2 View Result Date: 07/20/2024 CLINICAL DATA:  Preop for hip surgery. History of hypertension. Ex smoker. EXAM: CHEST - 2 VIEW COMPARISON:  03/19/2017.  CT, 09/09/2023. FINDINGS: Cardiac silhouette is normal in size. No  mediastinal or hilar masses. No evidence of adenopathy. Lungs are clear.  No pleural effusion or pneumothorax. Skeletal structures are intact. IMPRESSION: No active cardiopulmonary disease. Electronically Signed   By: Alm Parkins M.D.   On: 07/20/2024 11:13    Disposition:   Discharge Instructions     Call MD / Call 911   Complete by: As directed    If you experience chest pain or shortness of breath, CALL 911 and be transported to the hospital emergency room.  If you develope a fever above 101 F, pus (white drainage) or increased drainage or redness at the wound, or calf pain, call your surgeon's office.   Call MD / Call 911   Complete by: As directed    If you experience chest pain or shortness of breath, CALL 911 and be transported to the hospital emergency room.  If you develope a fever above 101 F, pus (white drainage) or increased drainage or redness at the wound, or calf pain, call your  surgeon's office.   Call MD / Call 911   Complete by: As directed    If you experience chest pain or shortness of breath, CALL 911 and be transported to the hospital emergency room.  If you develope a fever above 101 F, pus (white drainage) or increased drainage or redness at the wound, or calf pain, call your surgeon's office.   Constipation Prevention   Complete by: As directed    Drink plenty of fluids.  Prune juice may be helpful.  You may use a stool softener, such as Colace (over the counter) 100 mg twice a day.  Use MiraLax  (over the counter) for constipation as needed.   Constipation Prevention   Complete by: As directed    Drink plenty of fluids.  Prune juice may be helpful.  You may use a stool softener, such as Colace (over the counter) 100 mg twice a day.  Use MiraLax  (over the counter) for constipation as needed.   Constipation Prevention   Complete by: As directed    Drink plenty of fluids.  Prune juice may be helpful.  You may use a stool softener, such as Colace (over the counter) 100  mg twice a day.  Use MiraLax  (over the counter) for constipation as needed.   Diet - low sodium heart healthy   Complete by: As directed    Diet - low sodium heart healthy   Complete by: As directed    Diet - low sodium heart healthy   Complete by: As directed    Discharge instructions   Complete by: As directed    INSTRUCTIONS AFTER JOINT REPLACEMENT   Remove items at home which could result in a fall. This includes throw rugs or furniture in walking pathways ICE to the affected joint every three hours while awake for 30 minutes at a time, for at least the first 3-5 days, and then as needed for pain and swelling.  Continue to use ice for pain and swelling. You may notice swelling that will progress down to the foot and ankle.  This is normal after surgery.  Elevate your leg when you are not up walking on it.   Continue to use the breathing machine you got in the hospital (incentive spirometer) which will help keep your temperature down.  It is common for your temperature to cycle up and down following surgery, especially at night when you are not up moving around and exerting yourself.  The breathing machine keeps your lungs expanded and your temperature down.   DIET:  As you were doing prior to hospitalization, we recommend a well-balanced diet.  DRESSING / WOUND CARE / SHOWERING  You may shower 3 days after surgery, but keep the wounds dry during showering.  You may use an occlusive plastic wrap (Press'n Seal for example), NO SOAKING/SUBMERGING IN THE BATHTUB.  If the bandage gets wet, change with a clean dry gauze.  If the incision gets wet, pat the wound dry with a clean towel.  ACTIVITY  Increase activity slowly as tolerated, but follow the weight bearing instructions below.   No driving for 6 weeks or until further direction given by your physician.  You cannot drive while taking narcotics.  No lifting or carrying greater than 10 lbs. until further directed by your surgeon. Avoid  periods of inactivity such as sitting longer than an hour when not asleep. This helps prevent blood clots.  You may return to work once you are authorized by your doctor.  WEIGHT BEARING   Weight bearing as tolerated with assist device (walker, cane, etc) as directed, use it as long as suggested by your surgeon or therapist, typically at least 4-6 weeks.   EXERCISES  Results after joint replacement surgery are often greatly improved when you follow the exercise, range of motion and muscle strengthening exercises prescribed by your doctor. Safety measures are also important to protect the joint from further injury. Any time any of these exercises cause you to have increased pain or swelling, decrease what you are doing until you are comfortable again and then slowly increase them. If you have problems or questions, call your caregiver or physical therapist for advice.   Rehabilitation is important following a joint replacement. After just a few days of immobilization, the muscles of the leg can become weakened and shrink (atrophy).  These exercises are designed to build up the tone and strength of the thigh and leg muscles and to improve motion. Often times heat used for twenty to thirty minutes before working out will loosen up your tissues and help with improving the range of motion but do not use heat for the first two weeks following surgery (sometimes heat can increase post-operative swelling).   These exercises can be done on a training (exercise) mat, on the floor, on a table or on a bed. Use whatever works the best and is most comfortable for you.    Use music or television while you are exercising so that the exercises are a pleasant break in your day. This will make your life better with the exercises acting as a break in your routine that you can look forward to.   Perform all exercises about fifteen times, three times per day or as directed.  You should exercise both the operative leg  and the other leg as well.  Exercises include:   Quad Sets - Tighten up the muscle on the front of the thigh (Quad) and hold for 5-10 seconds.   Straight Leg Raises - With your knee straight (if you were given a brace, keep it on), lift the leg to 60 degrees, hold for 3 seconds, and slowly lower the leg.  Perform this exercise against resistance later as your leg gets stronger.  Leg Slides: Lying on your back, slowly slide your foot toward your buttocks, bending your knee up off the floor (only go as far as is comfortable). Then slowly slide your foot back down until your leg is flat on the floor again.  Angel Wings: Lying on your back spread your legs to the side as far apart as you can without causing discomfort.  Hamstring Strength:  Lying on your back, push your heel against the floor with your leg straight by tightening up the muscles of your buttocks.  Repeat, but this time bend your knee to a comfortable angle, and push your heel against the floor.  You may put a pillow under the heel to make it more comfortable if necessary.   A rehabilitation program following joint replacement surgery can speed recovery and prevent re-injury in the future due to weakened muscles. Contact your doctor or a physical therapist for more information on knee rehabilitation.    CONSTIPATION  Constipation is defined medically as fewer than three stools per week and severe constipation as less than one stool per week.  Even if you have a regular bowel pattern at home, your normal regimen is likely to be disrupted due to multiple reasons following surgery.  Combination  of anesthesia, postoperative narcotics, change in appetite and fluid intake all can affect your bowels.   YOU MUST use at least one of the following options; they are listed in order of increasing strength to get the job done.  They are all available over the counter, and you may need to use some, POSSIBLY even all of these options:    Drink plenty  of fluids (prune juice may be helpful) and high fiber foods Colace 100 mg by mouth twice a day  Senokot for constipation as directed and as needed Dulcolax (bisacodyl ), take with full glass of water  Miralax  (polyethylene glycol) once or twice a day as needed.  If you have tried all these things and are unable to have a bowel movement in the first 3-4 days after surgery call either your surgeon or your primary doctor.    If you experience loose stools or diarrhea, hold the medications until you stool forms back up.  If your symptoms do not get better within 1 week or if they get worse, check with your doctor.  If you experience the worst abdominal pain ever or develop nausea or vomiting, please contact the office immediately for further recommendations for treatment.   ITCHING:  If you experience itching with your medications, try taking only a single pain pill, or even half a pain pill at a time.  You can also use Benadryl  over the counter for itching or also to help with sleep.   TED HOSE STOCKINGS:  Use stockings on both legs until for at least 2 weeks or as directed by physician office. They may be removed at night for sleeping.  MEDICATIONS:  See your medication summary on the After Visit Summary that nursing will review with you.  You may have some home medications which will be placed on hold until you complete the course of blood thinner medication.  It is important for you to complete the blood thinner medication as prescribed.  PRECAUTIONS:  If you experience chest pain or shortness of breath - call 911 immediately for transfer to the hospital emergency department.   If you develop a fever greater that 101 F, purulent drainage from wound, increased redness or drainage from wound, foul odor from the wound/dressing, or calf pain - CONTACT YOUR SURGEON.                                                   FOLLOW-UP APPOINTMENTS:  If you do not already have a post-op appointment, please  call the office for an appointment to be seen by your surgeon.  Guidelines for how soon to be seen are listed in your After Visit Summary, but are typically between 1-4 weeks after surgery.  OTHER INSTRUCTIONS:   Knee Replacement:  Do not place pillow under knee, focus on keeping the knee straight while resting. CPM instructions: 0-90 degrees, 2 hours in the morning, 2 hours in the afternoon, and 2 hours in the evening. Place foam block, curve side up under heel at all times except when in CPM or when walking.  DO NOT modify, tear, cut, or change the foam block in any way.  POST-OPERATIVE OPIOID TAPER INSTRUCTIONS: It is important to wean off of your opioid medication as soon as possible. If you do not need pain medication after your surgery it is ok to stop  day one. Opioids include: Codeine, Hydrocodone (Norco, Vicodin), Oxycodone (Percocet, oxycontin ) and hydromorphone  amongst others.  Long term and even short term use of opiods can cause: Increased pain response Dependence Constipation Depression Respiratory depression And more.  Withdrawal symptoms can include Flu like symptoms Nausea, vomiting And more Techniques to manage these symptoms Hydrate well Eat regular healthy meals Stay active Use relaxation techniques(deep breathing, meditating, yoga) Do Not substitute Alcohol  to help with tapering If you have been on opioids for less than two weeks and do not have pain than it is ok to stop all together.  Plan to wean off of opioids This plan should start within one week post op of your joint replacement. Maintain the same interval or time between taking each dose and first decrease the dose.  Cut the total daily intake of opioids by one tablet each day Next start to increase the time between doses. The last dose that should be eliminated is the evening dose.     MAKE SURE YOU:  Understand these instructions.  Get help right away if you are not doing well or get worse.     Thank you for letting us  be a part of your medical care team.  It is a privilege we respect greatly.  We hope these instructions will help you stay on track for a fast and full recovery!   Discharge instructions   Complete by: As directed    INSTRUCTIONS AFTER JOINT REPLACEMENT   Remove items at home which could result in a fall. This includes throw rugs or furniture in walking pathways ICE to the affected joint every three hours while awake for 30 minutes at a time, for at least the first 3-5 days, and then as needed for pain and swelling.  Continue to use ice for pain and swelling. You may notice swelling that will progress down to the foot and ankle.  This is normal after surgery.  Elevate your leg when you are not up walking on it.   Continue to use the breathing machine you got in the hospital (incentive spirometer) which will help keep your temperature down.  It is common for your temperature to cycle up and down following surgery, especially at night when you are not up moving around and exerting yourself.  The breathing machine keeps your lungs expanded and your temperature down.   DIET:  As you were doing prior to hospitalization, we recommend a well-balanced diet.  DRESSING / WOUND CARE / SHOWERING  You may shower 3 days after surgery, but keep the wounds dry during showering.  You may use an occlusive plastic wrap (Press'n Seal for example), NO SOAKING/SUBMERGING IN THE BATHTUB.  If the bandage gets wet, change with a clean dry gauze.  If the incision gets wet, pat the wound dry with a clean towel.  ACTIVITY  Increase activity slowly as tolerated, but follow the weight bearing instructions below.   No driving for 6 weeks or until further direction given by your physician.  You cannot drive while taking narcotics.  No lifting or carrying greater than 10 lbs. until further directed by your surgeon. Avoid periods of inactivity such as sitting longer than an hour when not asleep.  This helps prevent blood clots.  You may return to work once you are authorized by your doctor.     WEIGHT BEARING   Weight bearing as tolerated with assist device (walker, cane, etc) as directed, use it as long as suggested by your surgeon or therapist, typically  at least 4-6 weeks.   EXERCISES  Results after joint replacement surgery are often greatly improved when you follow the exercise, range of motion and muscle strengthening exercises prescribed by your doctor. Safety measures are also important to protect the joint from further injury. Any time any of these exercises cause you to have increased pain or swelling, decrease what you are doing until you are comfortable again and then slowly increase them. If you have problems or questions, call your caregiver or physical therapist for advice.   Rehabilitation is important following a joint replacement. After just a few days of immobilization, the muscles of the leg can become weakened and shrink (atrophy).  These exercises are designed to build up the tone and strength of the thigh and leg muscles and to improve motion. Often times heat used for twenty to thirty minutes before working out will loosen up your tissues and help with improving the range of motion but do not use heat for the first two weeks following surgery (sometimes heat can increase post-operative swelling).   These exercises can be done on a training (exercise) mat, on the floor, on a table or on a bed. Use whatever works the best and is most comfortable for you.    Use music or television while you are exercising so that the exercises are a pleasant break in your day. This will make your life better with the exercises acting as a break in your routine that you can look forward to.   Perform all exercises about fifteen times, three times per day or as directed.  You should exercise both the operative leg and the other leg as well.  Exercises include:   Quad Sets - Tighten up  the muscle on the front of the thigh (Quad) and hold for 5-10 seconds.   Straight Leg Raises - With your knee straight (if you were given a brace, keep it on), lift the leg to 60 degrees, hold for 3 seconds, and slowly lower the leg.  Perform this exercise against resistance later as your leg gets stronger.  Leg Slides: Lying on your back, slowly slide your foot toward your buttocks, bending your knee up off the floor (only go as far as is comfortable). Then slowly slide your foot back down until your leg is flat on the floor again.  Angel Wings: Lying on your back spread your legs to the side as far apart as you can without causing discomfort.  Hamstring Strength:  Lying on your back, push your heel against the floor with your leg straight by tightening up the muscles of your buttocks.  Repeat, but this time bend your knee to a comfortable angle, and push your heel against the floor.  You may put a pillow under the heel to make it more comfortable if necessary.   A rehabilitation program following joint replacement surgery can speed recovery and prevent re-injury in the future due to weakened muscles. Contact your doctor or a physical therapist for more information on knee rehabilitation.    CONSTIPATION  Constipation is defined medically as fewer than three stools per week and severe constipation as less than one stool per week.  Even if you have a regular bowel pattern at home, your normal regimen is likely to be disrupted due to multiple reasons following surgery.  Combination of anesthesia, postoperative narcotics, change in appetite and fluid intake all can affect your bowels.   YOU MUST use at least one of the following options; they  are listed in order of increasing strength to get the job done.  They are all available over the counter, and you may need to use some, POSSIBLY even all of these options:    Drink plenty of fluids (prune juice may be helpful) and high fiber foods Colace 100 mg by  mouth twice a day  Senokot for constipation as directed and as needed Dulcolax (bisacodyl ), take with full glass of water  Miralax  (polyethylene glycol) once or twice a day as needed.  If you have tried all these things and are unable to have a bowel movement in the first 3-4 days after surgery call either your surgeon or your primary doctor.    If you experience loose stools or diarrhea, hold the medications until you stool forms back up.  If your symptoms do not get better within 1 week or if they get worse, check with your doctor.  If you experience the worst abdominal pain ever or develop nausea or vomiting, please contact the office immediately for further recommendations for treatment.   ITCHING:  If you experience itching with your medications, try taking only a single pain pill, or even half a pain pill at a time.  You can also use Benadryl  over the counter for itching or also to help with sleep.   TED HOSE STOCKINGS:  Use stockings on both legs until for at least 2 weeks or as directed by physician office. They may be removed at night for sleeping.  MEDICATIONS:  See your medication summary on the After Visit Summary that nursing will review with you.  You may have some home medications which will be placed on hold until you complete the course of blood thinner medication.  It is important for you to complete the blood thinner medication as prescribed.  PRECAUTIONS:  If you experience chest pain or shortness of breath - call 911 immediately for transfer to the hospital emergency department.   If you develop a fever greater that 101 F, purulent drainage from wound, increased redness or drainage from wound, foul odor from the wound/dressing, or calf pain - CONTACT YOUR SURGEON.                                                   FOLLOW-UP APPOINTMENTS:  If you do not already have a post-op appointment, please call the office for an appointment to be seen by your surgeon.  Guidelines for  how soon to be seen are listed in your After Visit Summary, but are typically between 1-4 weeks after surgery.  OTHER INSTRUCTIONS:   Knee Replacement:  Do not place pillow under knee, focus on keeping the knee straight while resting. CPM instructions: 0-90 degrees, 2 hours in the morning, 2 hours in the afternoon, and 2 hours in the evening. Place foam block, curve side up under heel at all times except when in CPM or when walking.  DO NOT modify, tear, cut, or change the foam block in any way.  POST-OPERATIVE OPIOID TAPER INSTRUCTIONS: It is important to wean off of your opioid medication as soon as possible. If you do not need pain medication after your surgery it is ok to stop day one. Opioids include: Codeine, Hydrocodone (Norco, Vicodin), Oxycodone (Percocet, oxycontin ) and hydromorphone  amongst others.  Long term and even short term use of opiods can cause: Increased pain response  Dependence Constipation Depression Respiratory depression And more.  Withdrawal symptoms can include Flu like symptoms Nausea, vomiting And more Techniques to manage these symptoms Hydrate well Eat regular healthy meals Stay active Use relaxation techniques(deep breathing, meditating, yoga) Do Not substitute Alcohol  to help with tapering If you have been on opioids for less than two weeks and do not have pain than it is ok to stop all together.  Plan to wean off of opioids This plan should start within one week post op of your joint replacement. Maintain the same interval or time between taking each dose and first decrease the dose.  Cut the total daily intake of opioids by one tablet each day Next start to increase the time between doses. The last dose that should be eliminated is the evening dose.     MAKE SURE YOU:  Understand these instructions.  Get help right away if you are not doing well or get worse.    Thank you for letting us  be a part of your medical care team.  It is a privilege  we respect greatly.  We hope these instructions will help you stay on track for a fast and full recovery!   Discharge instructions   Complete by: As directed    INSTRUCTIONS AFTER JOINT REPLACEMENT   Remove items at home which could result in a fall. This includes throw rugs or furniture in walking pathways ICE to the affected joint every three hours while awake for 30 minutes at a time, for at least the first 3-5 days, and then as needed for pain and swelling.  Continue to use ice for pain and swelling. You may notice swelling that will progress down to the foot and ankle.  This is normal after surgery.  Elevate your leg when you are not up walking on it.   Continue to use the breathing machine you got in the hospital (incentive spirometer) which will help keep your temperature down.  It is common for your temperature to cycle up and down following surgery, especially at night when you are not up moving around and exerting yourself.  The breathing machine keeps your lungs expanded and your temperature down.   DIET:  As you were doing prior to hospitalization, we recommend a well-balanced diet.  DRESSING / WOUND CARE / SHOWERING  You may shower 3 days after surgery, but keep the wounds dry during showering.  You may use an occlusive plastic wrap (Press'n Seal for example), NO SOAKING/SUBMERGING IN THE BATHTUB.  If the bandage gets wet, change with a clean dry gauze.  If the incision gets wet, pat the wound dry with a clean towel.  ACTIVITY  Increase activity slowly as tolerated, but follow the weight bearing instructions below.   No driving for 6 weeks or until further direction given by your physician.  You cannot drive while taking narcotics.  No lifting or carrying greater than 10 lbs. until further directed by your surgeon. Avoid periods of inactivity such as sitting longer than an hour when not asleep. This helps prevent blood clots.  You may return to work once you are authorized by your  doctor.     WEIGHT BEARING   Weight bearing as tolerated with assist device (walker, cane, etc) as directed, use it as long as suggested by your surgeon or therapist, typically at least 4-6 weeks.   EXERCISES  Results after joint replacement surgery are often greatly improved when you follow the exercise, range of motion and muscle strengthening  exercises prescribed by your doctor. Safety measures are also important to protect the joint from further injury. Any time any of these exercises cause you to have increased pain or swelling, decrease what you are doing until you are comfortable again and then slowly increase them. If you have problems or questions, call your caregiver or physical therapist for advice.   Rehabilitation is important following a joint replacement. After just a few days of immobilization, the muscles of the leg can become weakened and shrink (atrophy).  These exercises are designed to build up the tone and strength of the thigh and leg muscles and to improve motion. Often times heat used for twenty to thirty minutes before working out will loosen up your tissues and help with improving the range of motion but do not use heat for the first two weeks following surgery (sometimes heat can increase post-operative swelling).   These exercises can be done on a training (exercise) mat, on the floor, on a table or on a bed. Use whatever works the best and is most comfortable for you.    Use music or television while you are exercising so that the exercises are a pleasant break in your day. This will make your life better with the exercises acting as a break in your routine that you can look forward to.   Perform all exercises about fifteen times, three times per day or as directed.  You should exercise both the operative leg and the other leg as well.  Exercises include:   Quad Sets - Tighten up the muscle on the front of the thigh (Quad) and hold for 5-10 seconds.   Straight Leg  Raises - With your knee straight (if you were given a brace, keep it on), lift the leg to 60 degrees, hold for 3 seconds, and slowly lower the leg.  Perform this exercise against resistance later as your leg gets stronger.  Leg Slides: Lying on your back, slowly slide your foot toward your buttocks, bending your knee up off the floor (only go as far as is comfortable). Then slowly slide your foot back down until your leg is flat on the floor again.  Angel Wings: Lying on your back spread your legs to the side as far apart as you can without causing discomfort.  Hamstring Strength:  Lying on your back, push your heel against the floor with your leg straight by tightening up the muscles of your buttocks.  Repeat, but this time bend your knee to a comfortable angle, and push your heel against the floor.  You may put a pillow under the heel to make it more comfortable if necessary.   A rehabilitation program following joint replacement surgery can speed recovery and prevent re-injury in the future due to weakened muscles. Contact your doctor or a physical therapist for more information on knee rehabilitation.    CONSTIPATION  Constipation is defined medically as fewer than three stools per week and severe constipation as less than one stool per week.  Even if you have a regular bowel pattern at home, your normal regimen is likely to be disrupted due to multiple reasons following surgery.  Combination of anesthesia, postoperative narcotics, change in appetite and fluid intake all can affect your bowels.   YOU MUST use at least one of the following options; they are listed in order of increasing strength to get the job done.  They are all available over the counter, and you may need to use some, POSSIBLY  even all of these options:    Drink plenty of fluids (prune juice may be helpful) and high fiber foods Colace 100 mg by mouth twice a day  Senokot for constipation as directed and as needed Dulcolax  (bisacodyl ), take with full glass of water  Miralax  (polyethylene glycol) once or twice a day as needed.  If you have tried all these things and are unable to have a bowel movement in the first 3-4 days after surgery call either your surgeon or your primary doctor.    If you experience loose stools or diarrhea, hold the medications until you stool forms back up.  If your symptoms do not get better within 1 week or if they get worse, check with your doctor.  If you experience the worst abdominal pain ever or develop nausea or vomiting, please contact the office immediately for further recommendations for treatment.   ITCHING:  If you experience itching with your medications, try taking only a single pain pill, or even half a pain pill at a time.  You can also use Benadryl  over the counter for itching or also to help with sleep.   TED HOSE STOCKINGS:  Use stockings on both legs until for at least 2 weeks or as directed by physician office. They may be removed at night for sleeping.  MEDICATIONS:  See your medication summary on the After Visit Summary that nursing will review with you.  You may have some home medications which will be placed on hold until you complete the course of blood thinner medication.  It is important for you to complete the blood thinner medication as prescribed.  PRECAUTIONS:  If you experience chest pain or shortness of breath - call 911 immediately for transfer to the hospital emergency department.   If you develop a fever greater that 101 F, purulent drainage from wound, increased redness or drainage from wound, foul odor from the wound/dressing, or calf pain - CONTACT YOUR SURGEON.                                                   FOLLOW-UP APPOINTMENTS:  If you do not already have a post-op appointment, please call the office for an appointment to be seen by your surgeon.  Guidelines for how soon to be seen are listed in your After Visit Summary, but are typically  between 1-4 weeks after surgery.  OTHER INSTRUCTIONS:   Knee Replacement:  Do not place pillow under knee, focus on keeping the knee straight while resting. CPM instructions: 0-90 degrees, 2 hours in the morning, 2 hours in the afternoon, and 2 hours in the evening. Place foam block, curve side up under heel at all times except when in CPM or when walking.  DO NOT modify, tear, cut, or change the foam block in any way.  POST-OPERATIVE OPIOID TAPER INSTRUCTIONS: It is important to wean off of your opioid medication as soon as possible. If you do not need pain medication after your surgery it is ok to stop day one. Opioids include: Codeine, Hydrocodone (Norco, Vicodin), Oxycodone (Percocet, oxycontin ) and hydromorphone  amongst others.  Long term and even short term use of opiods can cause: Increased pain response Dependence Constipation Depression Respiratory depression And more.  Withdrawal symptoms can include Flu like symptoms Nausea, vomiting And more Techniques to manage these symptoms Hydrate well Eat regular  healthy meals Stay active Use relaxation techniques(deep breathing, meditating, yoga) Do Not substitute Alcohol  to help with tapering If you have been on opioids for less than two weeks and do not have pain than it is ok to stop all together.  Plan to wean off of opioids This plan should start within one week post op of your joint replacement. Maintain the same interval or time between taking each dose and first decrease the dose.  Cut the total daily intake of opioids by one tablet each day Next start to increase the time between doses. The last dose that should be eliminated is the evening dose.     MAKE SURE YOU:  Understand these instructions.  Get help right away if you are not doing well or get worse.    Thank you for letting us  be a part of your medical care team.  It is a privilege we respect greatly.  We hope these instructions will help you stay on track for  a fast and full recovery!   Increase activity slowly as tolerated   Complete by: As directed    Increase activity slowly as tolerated   Complete by: As directed    Increase activity slowly as tolerated   Complete by: As directed    Post-operative opioid taper instructions:   Complete by: As directed    POST-OPERATIVE OPIOID TAPER INSTRUCTIONS: It is important to wean off of your opioid medication as soon as possible. If you do not need pain medication after your surgery it is ok to stop day one. Opioids include: Codeine, Hydrocodone (Norco, Vicodin), Oxycodone (Percocet, oxycontin ) and hydromorphone  amongst others.  Long term and even short term use of opiods can cause: Increased pain response Dependence Constipation Depression Respiratory depression And more.  Withdrawal symptoms can include Flu like symptoms Nausea, vomiting And more Techniques to manage these symptoms Hydrate well Eat regular healthy meals Stay active Use relaxation techniques(deep breathing, meditating, yoga) Do Not substitute Alcohol  to help with tapering If you have been on opioids for less than two weeks and do not have pain than it is ok to stop all together.  Plan to wean off of opioids This plan should start within one week post op of your joint replacement. Maintain the same interval or time between taking each dose and first decrease the dose.  Cut the total daily intake of opioids by one tablet each day Next start to increase the time between doses. The last dose that should be eliminated is the evening dose.      Post-operative opioid taper instructions:   Complete by: As directed    POST-OPERATIVE OPIOID TAPER INSTRUCTIONS: It is important to wean off of your opioid medication as soon as possible. If you do not need pain medication after your surgery it is ok to stop day one. Opioids include: Codeine, Hydrocodone (Norco, Vicodin), Oxycodone (Percocet, oxycontin ) and hydromorphone  amongst others.   Long term and even short term use of opiods can cause: Increased pain response Dependence Constipation Depression Respiratory depression And more.  Withdrawal symptoms can include Flu like symptoms Nausea, vomiting And more Techniques to manage these symptoms Hydrate well Eat regular healthy meals Stay active Use relaxation techniques(deep breathing, meditating, yoga) Do Not substitute Alcohol  to help with tapering If you have been on opioids for less than two weeks and do not have pain than it is ok to stop all together.  Plan to wean off of opioids This plan should start within one week post op of your joint  replacement. Maintain the same interval or time between taking each dose and first decrease the dose.  Cut the total daily intake of opioids by one tablet each day Next start to increase the time between doses. The last dose that should be eliminated is the evening dose.      Post-operative opioid taper instructions:   Complete by: As directed    POST-OPERATIVE OPIOID TAPER INSTRUCTIONS: It is important to wean off of your opioid medication as soon as possible. If you do not need pain medication after your surgery it is ok to stop day one. Opioids include: Codeine, Hydrocodone (Norco, Vicodin), Oxycodone (Percocet, oxycontin ) and hydromorphone  amongst others.  Long term and even short term use of opiods can cause: Increased pain response Dependence Constipation Depression Respiratory depression And more.  Withdrawal symptoms can include Flu like symptoms Nausea, vomiting And more Techniques to manage these symptoms Hydrate well Eat regular healthy meals Stay active Use relaxation techniques(deep breathing, meditating, yoga) Do Not substitute Alcohol  to help with tapering If you have been on opioids for less than two weeks and do not have pain than it is ok to stop all together.  Plan to wean off of opioids This plan should start within one week post op of your  joint replacement. Maintain the same interval or time between taking each dose and first decrease the dose.  Cut the total daily intake of opioids by one tablet each day Next start to increase the time between doses. The last dose that should be eliminated is the evening dose.           Follow-up Information     Sheril Coy, MD. Go on 08/05/2024.   Specialty: Orthopedic Surgery Why: your appointment is scheduled for 9:30. Contact information: [address] [place] [address] [phone]         [facility] Go on 08/01/2024.   Why: Your outpatient physical therapy is scheduled for 4:00. Please arrive at 3:40 to complete your paperwork Contact information: Physical Therapy [address] [place] [address] [phone]                  Signed: Cherelle Midkiff J [place] 07/31/2024, 10:27 AM

## 2024-07-31 NOTE — Progress Notes (Signed)
 Physical Therapy Treatment Patient Details Name: Barbara [name] MRN: [id number] DOB: 01-Mar-1951 Today's Date: 07/31/2024   History of Present Illness 73 yo female presents to therapy s/p R THA, anterior approach due to failure of conservative measures. Pt PMH includes but is not limited to: asthma, CKD, HTN, GERD, hep C, GAD, COPD, and angina.    PT Comments  Pt agreeable to session, states that she needs to use the bathroom, assisted to and from with pt completing task ind. She is able to amb x55ft with improving WB tolerance bilaterally. Stair negotiation completed at CGA/supervision A depending on technique but able to demonstrate a safer pattern with LLE ascending and RLE descending. Pt demonstrates safe mobility sufficient for home entry/exit and mobility around the home.     If plan is discharge home, recommend the following: A little help with bathing/dressing/bathroom;Assistance with cooking/housework;Assist for transportation;Help with stairs or ramp for entrance   Can travel by private vehicle        Equipment Recommendations  None recommended by PT    Recommendations for Other Services       Precautions / Restrictions Precautions Precautions: Fall Recall of Precautions/Restrictions: Intact Restrictions Weight Bearing Restrictions Per Provider Order: Yes RLE Weight Bearing Per Provider Order: Weight bearing as tolerated Other Position/Activity Restrictions: Ant. Hip, no Precautions     Mobility  Bed Mobility Overal bed mobility: Modified Independent Bed Mobility: Supine to Sit, Sit to Supine     Supine to sit: HOB elevated, Used rails, Modified independent (Device/Increase time)     General bed mobility comments: increased time    Transfers Overall transfer level: Needs assistance Equipment used: Rolling walker (2 wheels) Transfers: Sit to/from Stand Sit to Stand: Modified independent (Device/Increase time)           General transfer comment: improved  trunk positioning with transfer    Ambulation/Gait Ambulation/Gait assistance: Supervision Gait Distance (Feet): 70 Feet Assistive device: Rolling walker (2 wheels) Gait Pattern/deviations: Decreased stride length, Decreased weight shift to right, Antalgic, Step-through pattern, Decreased stance time - right, Decreased step length - left Gait velocity: decreased     General Gait Details: Pt able to demonstrate slow velocity amb using continuous steps. directional changes completed safely   Stairs Stairs: Yes Stairs assistance: Contact guard assist Stair Management: One rail Right, Step to pattern Number of Stairs: 4 General stair comments: Pt able to demonstrate non reciprocal stair negotiation at CGA with RLE lead, completes a second time with LLE lead which is done at sup A with L HR. descending steps with RLE lead well tolerated.   Wheelchair Mobility     Tilt Bed    Modified Rankin (Stroke Patients Only)       Balance Overall balance assessment: Needs assistance Sitting-balance support: Feet supported Sitting balance-Leahy Scale: Good     Standing balance support: During functional activity, Reliant on assistive device for balance Standing balance-Leahy Scale: Good                              Communication Communication Communication: No apparent difficulties  Cognition Arousal: Alert Behavior During Therapy: WFL for tasks assessed/performed   PT - Cognitive impairments: No apparent impairments                         Following commands: Intact      Cueing Cueing Techniques: Verbal cues  Exercises  General Comments        Pertinent Vitals/Pain Pain Assessment Pain Assessment: Faces Faces Pain Scale: Hurts a little bit Pain Location: R hip, R knee Pain Descriptors / Indicators: Aching, Constant, Discomfort, Dull, Operative site guarding Pain Intervention(s): Monitored during session, Repositioned    Home Living                           Prior Function            PT Goals (current goals can now be found in the care plan section) Acute Rehab PT Goals Patient Stated Goal: to be able to walk no pain PT Goal Formulation: With patient Time For Goal Achievement: 08/16/24 Potential to Achieve Goals: Good Progress towards PT goals: Progressing toward goals    Frequency    7X/week      PT Plan      Co-evaluation              AM-PAC PT 6 Clicks Mobility   Outcome Measure  Help needed turning from your back to your side while in a flat bed without using bedrails?: None Help needed moving from lying on your back to sitting on the side of a flat bed without using bedrails?: None Help needed moving to and from a bed to a chair (including a wheelchair)?: A Little Help needed standing up from a chair using your arms (e.g., wheelchair or bedside chair)?: None Help needed to walk in hospital room?: A Little Help needed climbing 3-5 steps with a railing? : A Little 6 Click Score: 21    End of Session Equipment Utilized During Treatment: Gait belt Activity Tolerance: Patient tolerated treatment well;Patient limited by fatigue Patient left: with call bell/phone within reach;in chair;with chair alarm set Nurse Communication: Mobility status PT Visit Diagnosis: Unsteadiness on feet (R26.81);Other abnormalities of gait and mobility (R26.89);Muscle weakness (generalized) (M62.81);Difficulty in walking, not elsewhere classified (R26.2);Pain Pain - Right/Left: Right Pain - part of body: Hip;Leg     Time: 9056-8989 PT Time Calculation (min) (ACUTE ONLY): 27 min  Charges:    $Gait Training: 23-37 mins PT General Charges $$ ACUTE PT VISIT: 1 Visit                     Stann, PT Acute Rehabilitation Services Office: [phone] 07/31/2024    [name] 07/31/2024, 10:16 AM

## 2024-07-31 NOTE — Plan of Care (Signed)
 Patient affect pleasant and cooperative. She has a complaint of pain in her right hip treated with po dilaudid ; the intervention is effective. She ambulates independently in her room. She is tolerating her diet and po medications well. She has not had a BM. Patient son is bedside. Will continue to monitor her progress.

## 2024-08-01 DIAGNOSIS — Z96641 Presence of right artificial hip joint: Secondary | ICD-10-CM | POA: Diagnosis not present

## 2024-08-01 DIAGNOSIS — R262 Difficulty in walking, not elsewhere classified: Secondary | ICD-10-CM | POA: Diagnosis not present

## 2024-08-02 LAB — BODY FLUID CULTURE W GRAM STAIN: Culture: NO GROWTH

## 2024-08-03 DIAGNOSIS — M1611 Unilateral primary osteoarthritis, right hip: Secondary | ICD-10-CM | POA: Diagnosis not present

## 2024-08-03 LAB — ANAEROBIC CULTURE W GRAM STAIN

## 2024-08-04 DIAGNOSIS — M79641 Pain in right hand: Secondary | ICD-10-CM | POA: Diagnosis not present

## 2024-08-04 DIAGNOSIS — R262 Difficulty in walking, not elsewhere classified: Secondary | ICD-10-CM | POA: Diagnosis not present

## 2024-08-07 DIAGNOSIS — J439 Emphysema, unspecified: Secondary | ICD-10-CM | POA: Diagnosis not present

## 2024-08-07 DIAGNOSIS — N1831 Chronic kidney disease, stage 3a: Secondary | ICD-10-CM | POA: Diagnosis not present

## 2024-08-07 DIAGNOSIS — J449 Chronic obstructive pulmonary disease, unspecified: Secondary | ICD-10-CM | POA: Diagnosis not present

## 2024-08-07 DIAGNOSIS — I129 Hypertensive chronic kidney disease with stage 1 through stage 4 chronic kidney disease, or unspecified chronic kidney disease: Secondary | ICD-10-CM | POA: Diagnosis not present

## 2024-08-07 DIAGNOSIS — J4520 Mild intermittent asthma, uncomplicated: Secondary | ICD-10-CM | POA: Diagnosis not present

## 2024-08-10 DIAGNOSIS — Z96641 Presence of right artificial hip joint: Secondary | ICD-10-CM | POA: Diagnosis not present

## 2024-08-10 DIAGNOSIS — R262 Difficulty in walking, not elsewhere classified: Secondary | ICD-10-CM | POA: Diagnosis not present

## 2024-08-12 DIAGNOSIS — J449 Chronic obstructive pulmonary disease, unspecified: Secondary | ICD-10-CM | POA: Diagnosis not present

## 2024-08-12 DIAGNOSIS — Z96641 Presence of right artificial hip joint: Secondary | ICD-10-CM | POA: Diagnosis not present

## 2024-08-12 DIAGNOSIS — J439 Emphysema, unspecified: Secondary | ICD-10-CM | POA: Diagnosis not present

## 2024-08-12 DIAGNOSIS — R262 Difficulty in walking, not elsewhere classified: Secondary | ICD-10-CM | POA: Diagnosis not present

## 2024-08-12 DIAGNOSIS — N1831 Chronic kidney disease, stage 3a: Secondary | ICD-10-CM | POA: Diagnosis not present

## 2024-08-12 DIAGNOSIS — I129 Hypertensive chronic kidney disease with stage 1 through stage 4 chronic kidney disease, or unspecified chronic kidney disease: Secondary | ICD-10-CM | POA: Diagnosis not present

## 2024-08-16 DIAGNOSIS — R262 Difficulty in walking, not elsewhere classified: Secondary | ICD-10-CM | POA: Diagnosis not present

## 2024-08-16 DIAGNOSIS — Z96641 Presence of right artificial hip joint: Secondary | ICD-10-CM | POA: Diagnosis not present

## 2024-08-18 DIAGNOSIS — R262 Difficulty in walking, not elsewhere classified: Secondary | ICD-10-CM | POA: Diagnosis not present

## 2024-08-18 DIAGNOSIS — Z96641 Presence of right artificial hip joint: Secondary | ICD-10-CM | POA: Diagnosis not present

## 2024-08-23 DIAGNOSIS — Z96641 Presence of right artificial hip joint: Secondary | ICD-10-CM | POA: Diagnosis not present

## 2024-08-23 DIAGNOSIS — R262 Difficulty in walking, not elsewhere classified: Secondary | ICD-10-CM | POA: Diagnosis not present

## 2024-08-25 DIAGNOSIS — Z96641 Presence of right artificial hip joint: Secondary | ICD-10-CM | POA: Diagnosis not present

## 2024-08-25 DIAGNOSIS — R262 Difficulty in walking, not elsewhere classified: Secondary | ICD-10-CM | POA: Diagnosis not present

## 2024-08-30 DIAGNOSIS — Z96641 Presence of right artificial hip joint: Secondary | ICD-10-CM | POA: Diagnosis not present

## 2024-08-30 DIAGNOSIS — R262 Difficulty in walking, not elsewhere classified: Secondary | ICD-10-CM | POA: Diagnosis not present

## 2024-09-01 DIAGNOSIS — L930 Discoid lupus erythematosus: Secondary | ICD-10-CM | POA: Diagnosis not present

## 2024-09-01 DIAGNOSIS — R262 Difficulty in walking, not elsewhere classified: Secondary | ICD-10-CM | POA: Diagnosis not present

## 2024-09-01 DIAGNOSIS — Z96641 Presence of right artificial hip joint: Secondary | ICD-10-CM | POA: Diagnosis not present

## 2024-09-06 DIAGNOSIS — J449 Chronic obstructive pulmonary disease, unspecified: Secondary | ICD-10-CM | POA: Diagnosis not present

## 2024-09-06 DIAGNOSIS — I129 Hypertensive chronic kidney disease with stage 1 through stage 4 chronic kidney disease, or unspecified chronic kidney disease: Secondary | ICD-10-CM | POA: Diagnosis not present

## 2024-09-06 DIAGNOSIS — N1831 Chronic kidney disease, stage 3a: Secondary | ICD-10-CM | POA: Diagnosis not present

## 2024-09-06 DIAGNOSIS — J439 Emphysema, unspecified: Secondary | ICD-10-CM | POA: Diagnosis not present

## 2024-09-06 DIAGNOSIS — J4520 Mild intermittent asthma, uncomplicated: Secondary | ICD-10-CM | POA: Diagnosis not present

## 2024-09-06 DIAGNOSIS — R262 Difficulty in walking, not elsewhere classified: Secondary | ICD-10-CM | POA: Diagnosis not present

## 2024-09-06 DIAGNOSIS — Z96641 Presence of right artificial hip joint: Secondary | ICD-10-CM | POA: Diagnosis not present

## 2024-09-08 DIAGNOSIS — Z96641 Presence of right artificial hip joint: Secondary | ICD-10-CM | POA: Diagnosis not present

## 2024-09-08 DIAGNOSIS — R262 Difficulty in walking, not elsewhere classified: Secondary | ICD-10-CM | POA: Diagnosis not present

## 2024-09-11 DIAGNOSIS — J449 Chronic obstructive pulmonary disease, unspecified: Secondary | ICD-10-CM | POA: Diagnosis not present

## 2024-09-11 DIAGNOSIS — J439 Emphysema, unspecified: Secondary | ICD-10-CM | POA: Diagnosis not present

## 2024-09-11 DIAGNOSIS — N1831 Chronic kidney disease, stage 3a: Secondary | ICD-10-CM | POA: Diagnosis not present

## 2024-09-11 DIAGNOSIS — I129 Hypertensive chronic kidney disease with stage 1 through stage 4 chronic kidney disease, or unspecified chronic kidney disease: Secondary | ICD-10-CM | POA: Diagnosis not present

## 2024-09-13 DIAGNOSIS — R262 Difficulty in walking, not elsewhere classified: Secondary | ICD-10-CM | POA: Diagnosis not present

## 2024-09-13 DIAGNOSIS — Z96641 Presence of right artificial hip joint: Secondary | ICD-10-CM | POA: Diagnosis not present

## 2024-09-15 DIAGNOSIS — Z96641 Presence of right artificial hip joint: Secondary | ICD-10-CM | POA: Diagnosis not present

## 2024-09-15 DIAGNOSIS — R262 Difficulty in walking, not elsewhere classified: Secondary | ICD-10-CM | POA: Diagnosis not present

## 2024-09-16 ENCOUNTER — Telehealth: Payer: Self-pay

## 2024-09-16 DIAGNOSIS — J4489 Other specified chronic obstructive pulmonary disease: Secondary | ICD-10-CM

## 2024-09-16 MED ORDER — DUPIXENT 300 MG/2ML ~~LOC~~ SOAJ: 300.0000 mg | SUBCUTANEOUS | 3 refills | Status: AC

## 2024-09-16 NOTE — Telephone Encounter (Signed)
 Received a call from the patient stating she used her last Dupixent  pen last Wednesday and needs a new rx sent to [facility]. She also mentioned wanting to get renewed for PAP for next year. There is an application in media tab from 11/18/23. Will reach out to [facility] to see when to submit renewal application.

## 2024-09-16 NOTE — Telephone Encounter (Signed)
 Dupixent  Rx sent to [facility].   Last OV with Dr. Meade in Juy 2025 with plan for f/u in January 2026, not yet scheduled.   Aleck Puls, PharmD, BCPS, CPP Clinical Pharmacist  [facility]

## 2024-09-18 ENCOUNTER — Other Ambulatory Visit: Payer: Self-pay | Admitting: Internal Medicine

## 2024-09-21 DIAGNOSIS — L930 Discoid lupus erythematosus: Secondary | ICD-10-CM | POA: Diagnosis not present

## 2024-09-22 ENCOUNTER — Other Ambulatory Visit (HOSPITAL_COMMUNITY): Payer: Self-pay

## 2024-09-22 DIAGNOSIS — R262 Difficulty in walking, not elsewhere classified: Secondary | ICD-10-CM | POA: Diagnosis not present

## 2024-09-22 DIAGNOSIS — Z96641 Presence of right artificial hip joint: Secondary | ICD-10-CM | POA: Diagnosis not present

## 2024-09-23 NOTE — Telephone Encounter (Signed)
 Received a call from the pt that she still has not heard from [facility] about her refill. Reached out to [employer]  FRM to see if he can escalate the case. He requested we fax the prescription to [facility] at [phone] even though it was e-scribed. Prescription was faxed on 10/17.  Called [facility] at [phone] and they did not have a profile for the pt. [facility] rep reached out to [employer] to have the pt's profile transferred over, she stated this could take 24 hours. She stated if they receive the prescription it will be attached to the pt's profile.  Pt is due for her next dose on 10/22. Advised her we can give a sample since this has been a long ongoing process. Pt stated she will come in on Monday to get the sample.

## 2024-09-26 DIAGNOSIS — Z96641 Presence of right artificial hip joint: Secondary | ICD-10-CM | POA: Diagnosis not present

## 2024-09-26 DIAGNOSIS — R262 Difficulty in walking, not elsewhere classified: Secondary | ICD-10-CM | POA: Diagnosis not present

## 2024-09-26 NOTE — Telephone Encounter (Signed)
 Provided pt sample of Dupixent  300mg /96mL Pens (Box of 2)  NDC: [phone] Lot: [id number] Exp: 08/07/26  Pt received text from [facility] sayig they received her prescription and requesting her current medications and allergies. Faxed copy of pt's med list to [facility] [phone].

## 2024-09-27 NOTE — Telephone Encounter (Signed)
 Received call from pt stating she was able to get in touch with [facility] and she will receive her shipment tomorrow.

## 2024-09-30 ENCOUNTER — Telehealth: Payer: Self-pay

## 2024-09-30 NOTE — Telephone Encounter (Signed)
 Received a call from the pt stating she was denied for her PAP renewal because they want her to apply for LIS. The pt had to do this last year and she didn't qualify, so she is familiar with the process. She stated she will reach out to them on Monday.

## 2024-09-30 NOTE — Telephone Encounter (Addendum)
 Received call from pt stating she was advised by [employer] to complete reenrollment online. She asked if she could come into the office to get assistance with completing online. We spoke to a [employer] case manager on the phone who stated she can fill out the form online at [email or web address] or submit the paper form she should be receiving in the mail by Oct 31st. Filled out reenrollment form online and submitted through portal. Gave pt a paper copy of form. Will await response from [employer]W.

## 2024-10-04 ENCOUNTER — Other Ambulatory Visit: Payer: Self-pay | Admitting: Internal Medicine

## 2024-10-04 DIAGNOSIS — Z6830 Body mass index (BMI) 30.0-30.9, adult: Secondary | ICD-10-CM | POA: Diagnosis not present

## 2024-10-04 DIAGNOSIS — E6609 Other obesity due to excess calories: Secondary | ICD-10-CM | POA: Diagnosis not present

## 2024-10-04 DIAGNOSIS — I129 Hypertensive chronic kidney disease with stage 1 through stage 4 chronic kidney disease, or unspecified chronic kidney disease: Secondary | ICD-10-CM | POA: Diagnosis not present

## 2024-10-04 DIAGNOSIS — N1831 Chronic kidney disease, stage 3a: Secondary | ICD-10-CM | POA: Diagnosis not present

## 2024-10-04 DIAGNOSIS — J449 Chronic obstructive pulmonary disease, unspecified: Secondary | ICD-10-CM | POA: Diagnosis not present

## 2024-10-04 DIAGNOSIS — I2721 Secondary pulmonary arterial hypertension: Secondary | ICD-10-CM | POA: Diagnosis not present

## 2024-10-07 ENCOUNTER — Telehealth: Payer: Self-pay

## 2024-10-07 DIAGNOSIS — J439 Emphysema, unspecified: Secondary | ICD-10-CM | POA: Diagnosis not present

## 2024-10-07 DIAGNOSIS — J4520 Mild intermittent asthma, uncomplicated: Secondary | ICD-10-CM | POA: Diagnosis not present

## 2024-10-07 DIAGNOSIS — N1831 Chronic kidney disease, stage 3a: Secondary | ICD-10-CM | POA: Diagnosis not present

## 2024-10-07 DIAGNOSIS — I129 Hypertensive chronic kidney disease with stage 1 through stage 4 chronic kidney disease, or unspecified chronic kidney disease: Secondary | ICD-10-CM | POA: Diagnosis not present

## 2024-10-07 DIAGNOSIS — J449 Chronic obstructive pulmonary disease, unspecified: Secondary | ICD-10-CM | POA: Diagnosis not present

## 2024-10-07 NOTE — Telephone Encounter (Signed)
 Pt dropped off her Spir[employer]  PAP application while discussing her Dupixent  PAP. Completed Spiriva  application and faxed to [facility] with income statement, insurance card and med list. Will update when we receive a response.  Phone #: [phone] Fax #: [phone]

## 2024-10-07 NOTE — Telephone Encounter (Signed)
 Received a fax from  [employer] regarding an approval for DUPIXENT  patient assistance from 12/08/2024 to 12/07/2025. Approval letter sent to scan center.  Phone: [phone]

## 2024-10-11 DIAGNOSIS — N1831 Chronic kidney disease, stage 3a: Secondary | ICD-10-CM | POA: Diagnosis not present

## 2024-10-11 DIAGNOSIS — J439 Emphysema, unspecified: Secondary | ICD-10-CM | POA: Diagnosis not present

## 2024-10-11 DIAGNOSIS — J449 Chronic obstructive pulmonary disease, unspecified: Secondary | ICD-10-CM | POA: Diagnosis not present

## 2024-10-11 DIAGNOSIS — I129 Hypertensive chronic kidney disease with stage 1 through stage 4 chronic kidney disease, or unspecified chronic kidney disease: Secondary | ICD-10-CM | POA: Diagnosis not present

## 2024-10-12 DIAGNOSIS — N959 Unspecified menopausal and perimenopausal disorder: Secondary | ICD-10-CM | POA: Diagnosis not present

## 2024-10-12 DIAGNOSIS — J4550 Severe persistent asthma, uncomplicated: Secondary | ICD-10-CM | POA: Diagnosis not present

## 2024-10-12 DIAGNOSIS — I129 Hypertensive chronic kidney disease with stage 1 through stage 4 chronic kidney disease, or unspecified chronic kidney disease: Secondary | ICD-10-CM | POA: Diagnosis not present

## 2024-10-12 DIAGNOSIS — J4489 Other specified chronic obstructive pulmonary disease: Secondary | ICD-10-CM | POA: Diagnosis not present

## 2024-10-12 DIAGNOSIS — Z818 Family history of other mental and behavioral disorders: Secondary | ICD-10-CM | POA: Diagnosis not present

## 2024-10-12 DIAGNOSIS — I700 Atherosclerosis of aorta: Secondary | ICD-10-CM | POA: Diagnosis not present

## 2024-10-12 DIAGNOSIS — Z823 Family history of stroke: Secondary | ICD-10-CM | POA: Diagnosis not present

## 2024-10-12 DIAGNOSIS — Z7951 Long term (current) use of inhaled steroids: Secondary | ICD-10-CM | POA: Diagnosis not present

## 2024-10-12 DIAGNOSIS — Z9989 Dependence on other enabling machines and devices: Secondary | ICD-10-CM | POA: Diagnosis not present

## 2024-10-12 DIAGNOSIS — N1831 Chronic kidney disease, stage 3a: Secondary | ICD-10-CM | POA: Diagnosis not present

## 2024-10-12 DIAGNOSIS — M1990 Unspecified osteoarthritis, unspecified site: Secondary | ICD-10-CM | POA: Diagnosis not present

## 2024-10-12 DIAGNOSIS — M8580 Other specified disorders of bone density and structure, unspecified site: Secondary | ICD-10-CM | POA: Diagnosis not present

## 2024-10-12 DIAGNOSIS — Z9181 History of falling: Secondary | ICD-10-CM | POA: Diagnosis not present

## 2024-10-12 DIAGNOSIS — D721 Eosinophilia, unspecified: Secondary | ICD-10-CM | POA: Diagnosis not present

## 2024-10-12 DIAGNOSIS — I289 Disease of pulmonary vessels, unspecified: Secondary | ICD-10-CM | POA: Diagnosis not present

## 2024-10-12 DIAGNOSIS — R32 Unspecified urinary incontinence: Secondary | ICD-10-CM | POA: Diagnosis not present

## 2024-10-12 DIAGNOSIS — M3500 Sicca syndrome, unspecified: Secondary | ICD-10-CM | POA: Diagnosis not present

## 2024-10-12 DIAGNOSIS — E785 Hyperlipidemia, unspecified: Secondary | ICD-10-CM | POA: Diagnosis not present

## 2024-10-12 DIAGNOSIS — K219 Gastro-esophageal reflux disease without esophagitis: Secondary | ICD-10-CM | POA: Diagnosis not present

## 2024-10-12 DIAGNOSIS — Z809 Family history of malignant neoplasm, unspecified: Secondary | ICD-10-CM | POA: Diagnosis not present

## 2024-10-12 DIAGNOSIS — M329 Systemic lupus erythematosus, unspecified: Secondary | ICD-10-CM | POA: Diagnosis not present

## 2024-10-17 DIAGNOSIS — M170 Bilateral primary osteoarthritis of knee: Secondary | ICD-10-CM | POA: Diagnosis not present

## 2024-10-17 DIAGNOSIS — M25562 Pain in left knee: Secondary | ICD-10-CM | POA: Diagnosis not present

## 2024-10-17 DIAGNOSIS — J449 Chronic obstructive pulmonary disease, unspecified: Secondary | ICD-10-CM | POA: Diagnosis not present

## 2024-10-17 DIAGNOSIS — B370 Candidal stomatitis: Secondary | ICD-10-CM | POA: Diagnosis not present

## 2024-10-17 DIAGNOSIS — Z96641 Presence of right artificial hip joint: Secondary | ICD-10-CM | POA: Diagnosis not present

## 2024-10-20 NOTE — Telephone Encounter (Signed)
 Received a call from the pt stating her PAP was approved until 12/07/25. NFN

## 2024-11-06 DIAGNOSIS — J4520 Mild intermittent asthma, uncomplicated: Secondary | ICD-10-CM | POA: Diagnosis not present

## 2024-11-06 DIAGNOSIS — N1831 Chronic kidney disease, stage 3a: Secondary | ICD-10-CM | POA: Diagnosis not present

## 2024-11-06 DIAGNOSIS — I129 Hypertensive chronic kidney disease with stage 1 through stage 4 chronic kidney disease, or unspecified chronic kidney disease: Secondary | ICD-10-CM | POA: Diagnosis not present

## 2024-11-06 DIAGNOSIS — J449 Chronic obstructive pulmonary disease, unspecified: Secondary | ICD-10-CM | POA: Diagnosis not present

## 2024-11-06 DIAGNOSIS — J439 Emphysema, unspecified: Secondary | ICD-10-CM | POA: Diagnosis not present

## 2024-11-09 ENCOUNTER — Encounter: Payer: Self-pay | Admitting: Internal Medicine

## 2024-11-09 ENCOUNTER — Ambulatory Visit: Admitting: Internal Medicine

## 2024-11-09 VITALS — BP 108/68 | HR 63 | Temp 98.3°F | Ht 64.0 in | Wt 171.8 lb

## 2024-11-09 DIAGNOSIS — D721 Eosinophilia, unspecified: Secondary | ICD-10-CM

## 2024-11-09 DIAGNOSIS — K219 Gastro-esophageal reflux disease without esophagitis: Secondary | ICD-10-CM

## 2024-11-09 DIAGNOSIS — B370 Candidal stomatitis: Secondary | ICD-10-CM | POA: Diagnosis not present

## 2024-11-09 DIAGNOSIS — J4489 Other specified chronic obstructive pulmonary disease: Secondary | ICD-10-CM | POA: Diagnosis not present

## 2024-11-09 DIAGNOSIS — J302 Other seasonal allergic rhinitis: Secondary | ICD-10-CM | POA: Diagnosis not present

## 2024-11-09 DIAGNOSIS — Z87891 Personal history of nicotine dependence: Secondary | ICD-10-CM

## 2024-11-09 DIAGNOSIS — Z122 Encounter for screening for malignant neoplasm of respiratory organs: Secondary | ICD-10-CM

## 2024-11-09 MED ORDER — FLUTICASONE-SALMETEROL 230-21 MCG/ACT IN AERO: 2.0000 | INHALATION_SPRAY | Freq: Two times a day (BID) | RESPIRATORY_TRACT | 12 refills | Status: AC

## 2024-11-09 NOTE — Progress Notes (Signed)
 Gerre M Dealmeida    [id number]    02-Jun-1951  Primary Care Physician:Sun, Vyvyan, MD Date of Appointment: 11/09/2024 Established Patient Visit  Chief complaint:   Chief Complaint  Patient presents with   Asthma   COPD    Increased sob with cold weather.     HPI: Barbara [name] is a 73 y.o. woman with Asthma COPD overlap syndrome on dupixent  since February 2024. S/p Pulmonary rehab. 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   Interval Updates: Here for asthma copd follow up.   She notes as the weather has gotten colder, her breathing has gotten worse.   She has had her flu and covid shots.  She had tried stopping the spiriva  and her breathing did worsen so she went back on it.   She does have seasonal allergies which flared in the fall.   Having thrush with DPI related to advair .   She did have her hip surgery on the right side - total replacement. She is feeling much better. She probably needs bilateral knee arthroplasty as well.   She hasn't had her CT scan yet for lung cancer screening - it was just approved so they will do before the end of the year.   Current Regimen: advair  500 1 puff BID, spiriva , prn albuterol , dupixent .  Asthma Triggers: allergies, being outside, exertion, cleaning solutions, heat Exacerbations in the last year: none requiring prednisone  since starting dupixent  History of hospitalization or intubation:none Allergy Testing: yes in 2022.  GERD: yes on PPI once daily Allergic Rhinitis: astelin , montelukast , prn xyzal  ACT:  Asthma Control Test ACT Total Score  11/09/2024  9:20 AM 22  06/22/2024  2:33 PM 23  12/02/2022  1:36 PM 21   FeNO: 137 ppb ---->24 ppb 09/18/22  I have reviewed the patient's family social and past medical history and updated as appropriate.   Past Medical History:  Diagnosis Date   Arthritis    right knee, neck   Asthma    Chronic kidney disease    COPD (chronic obstructive  pulmonary disease) (HCC)    Dyspnea    Hypertension    Mouth trouble    infection from tooth that was pulled approx. mid Mar. 2013    Past Surgical History:  Procedure Laterality Date   ABDOMINAL HYSTERECTOMY     SHOULDER SURGERY  in age 1's   cyst removed from Bursa socket of left shoulder   TONSILLECTOMY  age 2   TOTAL HIP ARTHROPLASTY Right 07/26/2024   Procedure: ARTHROPLASTY, HIP, TOTAL, ANTERIOR APPROACH;  Surgeon: Sheril Coy, MD;  Location: [facility];  Service: Orthopedics;  Laterality: Right;   TUBAL LIGATION      Family History  Problem Relation Age of Onset   Dementia Mother    Kidney disease Mother    Glaucoma Mother    Cataracts Mother    Asthma Sister    Mental illness Brother    Glaucoma Paternal Aunt    Blindness Paternal Aunt    Breast cancer Neg Hx     Social History   Occupational History   Not on file  Tobacco Use   Smoking status: Former    Current packs/day: 0.00    Average packs/day: 1 pack/day for 45.0 years (45.0 ttl pk-yrs)    Types: Cigarettes    Start date: 10/26/1966    Quit date: 10/27/2011    Years since quitting: 13.0   Smokeless tobacco:  Never  Vaping Use   Vaping status: Never Used  Substance and Sexual Activity   Alcohol  use: No   Drug use: No   Sexual activity: Never     Physical Exam: Blood pressure 108/68, pulse 63, temperature 98.3 F (36.8 C), temperature source Oral, height 5' 4 (1.626 m), weight 171 lb 12.8 oz (77.9 kg), SpO2 97%.  Gen:      No distress, well appearing, ambulating with cane ENT: no thrush Lungs:   ctab no wheeze CV:        RRR no mrg   Data Reviewed: Imaging: I have personally reviewed the CT lung cancer screening October 2024 - emphysema, no concerning nodules or masses.   PFTs:      Latest Ref Rng & Units 04/17/2021    2:55 PM  PFT Results  FVC-Pre L 1.58   FVC-Predicted Pre % 68   FVC-Post L 1.72   FVC-Predicted Post % 74   Pre FEV1/FVC % % 63   Post FEV1/FCV % % 66    FEV1-Pre L 0.99   FEV1-Predicted Pre % 55   FEV1-Post L 1.13    I have personally reviewed the patient's PFTs and moderately severe airflow limitation  Labs: IgE elevated 1,061 Sensitive to dust mites, cats, dogs, grass, cockroach, mouse  Immunization status: Immunization History  Administered Date(s) Administered   Fluzone Influenza virus vaccine,trivalent (IIV3), split virus 10/06/2013, 09/04/2017, 09/03/2018   Hepatitis A, Adult 03/12/2016, 09/08/2016   INFLUENZA, HIGH DOSE SEASONAL PF 08/08/2021   Influenza Split 10/22/2011, 09/24/2012, 09/22/2013, 09/07/2014, 09/08/2015   Influenza-Unspecified 09/19/2022   PFIZER(Purple Top)SARS-COV-2 Vaccination 01/02/2020, 01/23/2020, 03/20/2021, 08/16/2021   Pneumococcal Conjugate-13 07/02/2015   Pneumococcal Polysaccharide-23 10/22/2011, 01/04/2019   Tdap 01/02/2016   Zoster, Live 01/02/2016, 10/21/2017, 12/22/2017    Assessment:  Severe Allergic Asthma COPD Overlap Syndrome, controlled Thrush Peripheral eosinophilia  History of tobacco use disorder, in remission.  GERD controlled Allergic Rhinitis controlled  Elevated FeNO  Plan/Recommendations: Sorry to hear about the thrush.  I will see if we can get the MDI inhaler version of the advair . 2 puffs in the morning, 2 puffs at night, gargle after use. Use with spacer. This should help reduce thrush.  Continue spiriva .  Continue albuterol  as needed. Continue dupixent .  For allergies - continue astelin , xyzal , singulair  Continue acid reflux medicine.  Make sure you get the LDCT for lung cancer screening before the year is out.    Verdon Gore, MD Pulmonary and Critical Care Medicine [facility] 11/09/2024 10:22 AM Pager: see AMION  If no response to pager, please call critical care on call (see AMION) until 7pm After 7:00 pm call Elink     Return to Care: Return in about 3 months (around 02/07/2025) for Dr Pleas.   Verdon Gore, MD Pulmonary and Critical Care  Medicine [facility] Office:[phone]

## 2024-11-09 NOTE — Patient Instructions (Signed)
 It was a pleasure to see you today!  Please schedule follow up with Dr. Pleas in 3 months. Please call sooner [phone] if issues or concerns arise. You can also send us  a message through MyChart, but but aware that this is not to be used for urgent issues and it may take up to 5-7 days to receive a reply. Please be aware that you will likely be able to view your results before I have a chance to respond to them. Please give us  5 business days to respond to any non-urgent results.    Sorry to hear about the thrush.  I will see if we can get the MDI inhaler version of the advair . 2 puffs in the morning, 2 puffs at night, gargle after use. Use with spacer. This should help reduce thrush.  Continue spiriva .  Continue albuterol  as needed. Continue dupixent .  For allergies - continue astelin , xyzal , singulair  Continue acid reflux medicine.  Make sure you get the LDCT for lung cancer screening before the year is out.

## 2024-11-15 ENCOUNTER — Ambulatory Visit
Admission: RE | Admit: 2024-11-15 | Discharge: 2024-11-15 | Disposition: A | Source: Ambulatory Visit | Attending: Family Medicine | Admitting: Family Medicine

## 2024-11-15 ENCOUNTER — Other Ambulatory Visit: Payer: Self-pay | Admitting: Emergency Medicine

## 2024-11-15 ENCOUNTER — Other Ambulatory Visit: Payer: Self-pay | Admitting: Family Medicine

## 2024-11-15 DIAGNOSIS — Z87891 Personal history of nicotine dependence: Secondary | ICD-10-CM

## 2024-11-15 DIAGNOSIS — Z1231 Encounter for screening mammogram for malignant neoplasm of breast: Secondary | ICD-10-CM

## 2024-11-15 DIAGNOSIS — Z122 Encounter for screening for malignant neoplasm of respiratory organs: Secondary | ICD-10-CM

## 2024-11-16 ENCOUNTER — Encounter: Payer: Self-pay | Admitting: Acute Care

## 2024-11-21 ENCOUNTER — Inpatient Hospital Stay
Admission: RE | Admit: 2024-11-21 | Discharge: 2024-11-21 | Disposition: A | Source: Ambulatory Visit | Attending: Acute Care | Admitting: Acute Care

## 2024-11-21 DIAGNOSIS — Z122 Encounter for screening for malignant neoplasm of respiratory organs: Secondary | ICD-10-CM

## 2024-11-21 DIAGNOSIS — Z87891 Personal history of nicotine dependence: Secondary | ICD-10-CM

## 2024-11-22 ENCOUNTER — Other Ambulatory Visit: Payer: Self-pay | Admitting: Family Medicine

## 2024-11-22 DIAGNOSIS — R928 Other abnormal and inconclusive findings on diagnostic imaging of breast: Secondary | ICD-10-CM

## 2024-11-25 ENCOUNTER — Other Ambulatory Visit

## 2024-11-25 ENCOUNTER — Inpatient Hospital Stay: Admission: RE | Admit: 2024-11-25 | Discharge: 2024-11-25 | Attending: Family Medicine | Admitting: Family Medicine

## 2024-11-25 ENCOUNTER — Other Ambulatory Visit: Payer: Self-pay | Admitting: Family Medicine

## 2024-11-25 ENCOUNTER — Other Ambulatory Visit: Payer: Self-pay

## 2024-11-25 DIAGNOSIS — Z122 Encounter for screening for malignant neoplasm of respiratory organs: Secondary | ICD-10-CM

## 2024-11-25 DIAGNOSIS — R928 Other abnormal and inconclusive findings on diagnostic imaging of breast: Secondary | ICD-10-CM

## 2024-11-25 DIAGNOSIS — Z87891 Personal history of nicotine dependence: Secondary | ICD-10-CM

## 2024-12-07 ENCOUNTER — Ambulatory Visit
Admission: RE | Admit: 2024-12-07 | Discharge: 2024-12-07 | Disposition: A | Discharge status: Home / Self Care | Source: Ambulatory Visit | Attending: Family Medicine | Admitting: Family Medicine

## 2024-12-07 DIAGNOSIS — R928 Other abnormal and inconclusive findings on diagnostic imaging of breast: Secondary | ICD-10-CM

## 2024-12-07 HISTORY — PX: BREAST BIOPSY: SHX20

## 2024-12-09 LAB — SURGICAL PATHOLOGY

## 2025-02-07 ENCOUNTER — Encounter

## 2025-04-07 ENCOUNTER — Encounter (INDEPENDENT_AMBULATORY_CARE_PROVIDER_SITE_OTHER): Admitting: Ophthalmology
